# Patient Record
Sex: Male | Born: 1935 | Race: White | Hispanic: No | Smoking: Never smoker
Health system: Southern US, Community
[De-identification: ages and names within clinical notes are randomized; demographics above are authoritative.]

## PROBLEM LIST (undated history)

## (undated) DIAGNOSIS — G629 Polyneuropathy, unspecified: Secondary | ICD-10-CM

## (undated) DIAGNOSIS — N189 Chronic kidney disease, unspecified: Secondary | ICD-10-CM

## (undated) DIAGNOSIS — R600 Localized edema: Secondary | ICD-10-CM

## (undated) DIAGNOSIS — I10 Essential (primary) hypertension: Secondary | ICD-10-CM

## (undated) DIAGNOSIS — E782 Mixed hyperlipidemia: Secondary | ICD-10-CM

## (undated) DIAGNOSIS — R7303 Prediabetes: Secondary | ICD-10-CM

## (undated) DIAGNOSIS — Z8739 Personal history of other diseases of the musculoskeletal system and connective tissue: Secondary | ICD-10-CM

## (undated) DIAGNOSIS — K5909 Other constipation: Secondary | ICD-10-CM

## (undated) DIAGNOSIS — M1990 Unspecified osteoarthritis, unspecified site: Secondary | ICD-10-CM

## (undated) DIAGNOSIS — N3281 Overactive bladder: Secondary | ICD-10-CM

## (undated) DIAGNOSIS — E669 Obesity, unspecified: Secondary | ICD-10-CM

## (undated) HISTORY — DX: Polyneuropathy, unspecified: G62.9

## (undated) HISTORY — DX: Prediabetes: R73.03

## (undated) HISTORY — DX: Obesity, unspecified: E66.9

## (undated) HISTORY — DX: Essential (primary) hypertension: I10

## (undated) HISTORY — DX: Mixed hyperlipidemia: E78.2

## (undated) HISTORY — DX: Other constipation: K59.09

## (undated) HISTORY — PX: STOMACH SURGERY: SHX791

## (undated) HISTORY — DX: Overactive bladder: N32.81

## (undated) HISTORY — PX: HERNIA REPAIR: SHX51

## (undated) HISTORY — DX: Personal history of other diseases of the musculoskeletal system and connective tissue: Z87.39

## (undated) HISTORY — PX: TONSILLECTOMY: SUR1361

## (undated) HISTORY — DX: Chronic kidney disease, unspecified: N18.9

## (undated) HISTORY — DX: Localized edema: R60.0

## (undated) HISTORY — DX: Unspecified osteoarthritis, unspecified site: M19.90

## (undated) HISTORY — PX: GREEN LIGHT LASER TURP (TRANSURETHRAL RESECTION OF PROSTATE: SHX6260

---

## 2003-12-04 ENCOUNTER — Ambulatory Visit: Payer: Self-pay | Admitting: Internal Medicine

## 2003-12-12 ENCOUNTER — Inpatient Hospital Stay: Payer: Self-pay | Admitting: Internal Medicine

## 2003-12-12 ENCOUNTER — Other Ambulatory Visit: Payer: Self-pay

## 2004-03-05 ENCOUNTER — Ambulatory Visit: Payer: Self-pay | Admitting: Gastroenterology

## 2004-06-14 ENCOUNTER — Inpatient Hospital Stay: Payer: Self-pay | Admitting: Specialist

## 2004-06-14 ENCOUNTER — Other Ambulatory Visit: Payer: Self-pay

## 2004-06-16 ENCOUNTER — Inpatient Hospital Stay: Payer: Self-pay | Admitting: Internal Medicine

## 2004-07-15 ENCOUNTER — Ambulatory Visit: Payer: Self-pay | Admitting: Internal Medicine

## 2009-06-28 ENCOUNTER — Ambulatory Visit: Payer: Self-pay | Admitting: Gastroenterology

## 2013-02-10 DIAGNOSIS — R059 Cough, unspecified: Secondary | ICD-10-CM | POA: Diagnosis not present

## 2013-02-10 DIAGNOSIS — R05 Cough: Secondary | ICD-10-CM | POA: Diagnosis not present

## 2013-04-08 DIAGNOSIS — E119 Type 2 diabetes mellitus without complications: Secondary | ICD-10-CM | POA: Diagnosis not present

## 2013-04-08 DIAGNOSIS — E785 Hyperlipidemia, unspecified: Secondary | ICD-10-CM | POA: Diagnosis not present

## 2013-04-08 DIAGNOSIS — Z79899 Other long term (current) drug therapy: Secondary | ICD-10-CM | POA: Diagnosis not present

## 2013-04-15 DIAGNOSIS — I10 Essential (primary) hypertension: Secondary | ICD-10-CM | POA: Diagnosis not present

## 2013-04-15 DIAGNOSIS — E785 Hyperlipidemia, unspecified: Secondary | ICD-10-CM | POA: Diagnosis not present

## 2013-04-15 DIAGNOSIS — E119 Type 2 diabetes mellitus without complications: Secondary | ICD-10-CM | POA: Diagnosis not present

## 2013-04-15 DIAGNOSIS — N189 Chronic kidney disease, unspecified: Secondary | ICD-10-CM | POA: Diagnosis not present

## 2013-06-27 DIAGNOSIS — D631 Anemia in chronic kidney disease: Secondary | ICD-10-CM | POA: Diagnosis not present

## 2013-06-27 DIAGNOSIS — E119 Type 2 diabetes mellitus without complications: Secondary | ICD-10-CM | POA: Diagnosis not present

## 2013-06-27 DIAGNOSIS — N183 Chronic kidney disease, stage 3 unspecified: Secondary | ICD-10-CM | POA: Diagnosis not present

## 2013-06-27 DIAGNOSIS — I10 Essential (primary) hypertension: Secondary | ICD-10-CM | POA: Diagnosis not present

## 2013-06-27 DIAGNOSIS — N2581 Secondary hyperparathyroidism of renal origin: Secondary | ICD-10-CM | POA: Diagnosis not present

## 2013-06-28 DIAGNOSIS — M7989 Other specified soft tissue disorders: Secondary | ICD-10-CM | POA: Diagnosis not present

## 2013-06-28 DIAGNOSIS — M79609 Pain in unspecified limb: Secondary | ICD-10-CM | POA: Diagnosis not present

## 2013-06-28 DIAGNOSIS — I8310 Varicose veins of unspecified lower extremity with inflammation: Secondary | ICD-10-CM | POA: Diagnosis not present

## 2013-07-06 DIAGNOSIS — I890 Lymphedema, not elsewhere classified: Secondary | ICD-10-CM | POA: Diagnosis not present

## 2013-07-13 DIAGNOSIS — M7989 Other specified soft tissue disorders: Secondary | ICD-10-CM | POA: Diagnosis not present

## 2013-07-20 DIAGNOSIS — I890 Lymphedema, not elsewhere classified: Secondary | ICD-10-CM | POA: Diagnosis not present

## 2013-07-27 DIAGNOSIS — M7989 Other specified soft tissue disorders: Secondary | ICD-10-CM | POA: Diagnosis not present

## 2013-07-27 DIAGNOSIS — M79609 Pain in unspecified limb: Secondary | ICD-10-CM | POA: Diagnosis not present

## 2013-07-27 DIAGNOSIS — I8310 Varicose veins of unspecified lower extremity with inflammation: Secondary | ICD-10-CM | POA: Diagnosis not present

## 2013-07-27 DIAGNOSIS — I10 Essential (primary) hypertension: Secondary | ICD-10-CM | POA: Diagnosis not present

## 2013-08-03 DIAGNOSIS — I890 Lymphedema, not elsewhere classified: Secondary | ICD-10-CM | POA: Diagnosis not present

## 2013-08-10 DIAGNOSIS — E119 Type 2 diabetes mellitus without complications: Secondary | ICD-10-CM | POA: Diagnosis not present

## 2013-08-10 DIAGNOSIS — I890 Lymphedema, not elsewhere classified: Secondary | ICD-10-CM | POA: Diagnosis not present

## 2013-08-10 DIAGNOSIS — Z79899 Other long term (current) drug therapy: Secondary | ICD-10-CM | POA: Diagnosis not present

## 2013-08-17 DIAGNOSIS — I890 Lymphedema, not elsewhere classified: Secondary | ICD-10-CM | POA: Diagnosis not present

## 2013-08-17 DIAGNOSIS — E785 Hyperlipidemia, unspecified: Secondary | ICD-10-CM | POA: Diagnosis not present

## 2013-08-17 DIAGNOSIS — N183 Chronic kidney disease, stage 3 unspecified: Secondary | ICD-10-CM | POA: Diagnosis not present

## 2013-08-17 DIAGNOSIS — I10 Essential (primary) hypertension: Secondary | ICD-10-CM | POA: Diagnosis not present

## 2013-08-17 DIAGNOSIS — R7309 Other abnormal glucose: Secondary | ICD-10-CM | POA: Diagnosis not present

## 2013-08-24 DIAGNOSIS — I8310 Varicose veins of unspecified lower extremity with inflammation: Secondary | ICD-10-CM | POA: Diagnosis not present

## 2013-08-24 DIAGNOSIS — I890 Lymphedema, not elsewhere classified: Secondary | ICD-10-CM | POA: Diagnosis not present

## 2013-08-24 DIAGNOSIS — I10 Essential (primary) hypertension: Secondary | ICD-10-CM | POA: Diagnosis not present

## 2013-08-24 DIAGNOSIS — M7989 Other specified soft tissue disorders: Secondary | ICD-10-CM | POA: Diagnosis not present

## 2013-09-19 DIAGNOSIS — N183 Chronic kidney disease, stage 3 unspecified: Secondary | ICD-10-CM | POA: Diagnosis not present

## 2013-09-19 DIAGNOSIS — R609 Edema, unspecified: Secondary | ICD-10-CM | POA: Diagnosis not present

## 2013-09-19 DIAGNOSIS — I10 Essential (primary) hypertension: Secondary | ICD-10-CM | POA: Diagnosis not present

## 2013-09-19 DIAGNOSIS — N039 Chronic nephritic syndrome with unspecified morphologic changes: Secondary | ICD-10-CM | POA: Diagnosis not present

## 2013-09-19 DIAGNOSIS — N2581 Secondary hyperparathyroidism of renal origin: Secondary | ICD-10-CM | POA: Diagnosis not present

## 2013-09-19 DIAGNOSIS — D631 Anemia in chronic kidney disease: Secondary | ICD-10-CM | POA: Diagnosis not present

## 2013-11-08 DIAGNOSIS — Z23 Encounter for immunization: Secondary | ICD-10-CM | POA: Diagnosis not present

## 2013-12-13 DIAGNOSIS — N183 Chronic kidney disease, stage 3 (moderate): Secondary | ICD-10-CM | POA: Diagnosis not present

## 2013-12-13 DIAGNOSIS — M7989 Other specified soft tissue disorders: Secondary | ICD-10-CM | POA: Diagnosis not present

## 2013-12-14 DIAGNOSIS — E785 Hyperlipidemia, unspecified: Secondary | ICD-10-CM | POA: Diagnosis not present

## 2013-12-14 DIAGNOSIS — I10 Essential (primary) hypertension: Secondary | ICD-10-CM | POA: Diagnosis not present

## 2013-12-14 DIAGNOSIS — R7309 Other abnormal glucose: Secondary | ICD-10-CM | POA: Diagnosis not present

## 2013-12-14 DIAGNOSIS — N183 Chronic kidney disease, stage 3 (moderate): Secondary | ICD-10-CM | POA: Diagnosis not present

## 2013-12-19 DIAGNOSIS — E782 Mixed hyperlipidemia: Secondary | ICD-10-CM | POA: Diagnosis not present

## 2013-12-19 DIAGNOSIS — R7309 Other abnormal glucose: Secondary | ICD-10-CM | POA: Diagnosis not present

## 2013-12-19 DIAGNOSIS — I10 Essential (primary) hypertension: Secondary | ICD-10-CM | POA: Diagnosis not present

## 2013-12-19 DIAGNOSIS — N183 Chronic kidney disease, stage 3 (moderate): Secondary | ICD-10-CM | POA: Diagnosis not present

## 2014-01-23 DIAGNOSIS — E1122 Type 2 diabetes mellitus with diabetic chronic kidney disease: Secondary | ICD-10-CM | POA: Diagnosis not present

## 2014-01-23 DIAGNOSIS — I129 Hypertensive chronic kidney disease with stage 1 through stage 4 chronic kidney disease, or unspecified chronic kidney disease: Secondary | ICD-10-CM | POA: Diagnosis not present

## 2014-01-23 DIAGNOSIS — N183 Chronic kidney disease, stage 3 (moderate): Secondary | ICD-10-CM | POA: Diagnosis not present

## 2014-01-23 DIAGNOSIS — R600 Localized edema: Secondary | ICD-10-CM | POA: Diagnosis not present

## 2014-01-23 DIAGNOSIS — R809 Proteinuria, unspecified: Secondary | ICD-10-CM | POA: Diagnosis not present

## 2014-04-26 DIAGNOSIS — Z8739 Personal history of other diseases of the musculoskeletal system and connective tissue: Secondary | ICD-10-CM | POA: Insufficient documentation

## 2014-04-26 HISTORY — DX: Personal history of other diseases of the musculoskeletal system and connective tissue: Z87.39

## 2014-06-20 DIAGNOSIS — M7989 Other specified soft tissue disorders: Secondary | ICD-10-CM | POA: Diagnosis not present

## 2014-06-20 DIAGNOSIS — I10 Essential (primary) hypertension: Secondary | ICD-10-CM | POA: Diagnosis not present

## 2014-07-24 DIAGNOSIS — E119 Type 2 diabetes mellitus without complications: Secondary | ICD-10-CM | POA: Diagnosis not present

## 2014-07-24 DIAGNOSIS — I10 Essential (primary) hypertension: Secondary | ICD-10-CM | POA: Diagnosis not present

## 2014-07-24 DIAGNOSIS — N2581 Secondary hyperparathyroidism of renal origin: Secondary | ICD-10-CM | POA: Diagnosis not present

## 2014-07-24 DIAGNOSIS — N183 Chronic kidney disease, stage 3 (moderate): Secondary | ICD-10-CM | POA: Diagnosis not present

## 2015-01-17 ENCOUNTER — Encounter: Payer: Self-pay | Admitting: Family Medicine

## 2015-02-07 ENCOUNTER — Ambulatory Visit (INDEPENDENT_AMBULATORY_CARE_PROVIDER_SITE_OTHER): Payer: Medicare Other | Admitting: Urology

## 2015-02-07 ENCOUNTER — Encounter: Payer: Self-pay | Admitting: Urology

## 2015-02-07 VITALS — BP 131/70 | HR 73 | Ht 68.0 in | Wt 225.9 lb

## 2015-02-07 DIAGNOSIS — I10 Essential (primary) hypertension: Secondary | ICD-10-CM | POA: Insufficient documentation

## 2015-02-07 DIAGNOSIS — R350 Frequency of micturition: Secondary | ICD-10-CM

## 2015-02-07 DIAGNOSIS — R32 Unspecified urinary incontinence: Secondary | ICD-10-CM

## 2015-02-07 DIAGNOSIS — N189 Chronic kidney disease, unspecified: Secondary | ICD-10-CM

## 2015-02-07 DIAGNOSIS — E785 Hyperlipidemia, unspecified: Secondary | ICD-10-CM | POA: Insufficient documentation

## 2015-02-07 DIAGNOSIS — M1990 Unspecified osteoarthritis, unspecified site: Secondary | ICD-10-CM

## 2015-02-07 DIAGNOSIS — R7303 Prediabetes: Secondary | ICD-10-CM

## 2015-02-07 DIAGNOSIS — N400 Enlarged prostate without lower urinary tract symptoms: Secondary | ICD-10-CM

## 2015-02-07 DIAGNOSIS — E669 Obesity, unspecified: Secondary | ICD-10-CM | POA: Insufficient documentation

## 2015-02-07 DIAGNOSIS — K5909 Other constipation: Secondary | ICD-10-CM

## 2015-02-07 DIAGNOSIS — E782 Mixed hyperlipidemia: Secondary | ICD-10-CM

## 2015-02-07 DIAGNOSIS — R600 Localized edema: Secondary | ICD-10-CM

## 2015-02-07 DIAGNOSIS — N3281 Overactive bladder: Secondary | ICD-10-CM

## 2015-02-07 DIAGNOSIS — G629 Polyneuropathy, unspecified: Secondary | ICD-10-CM

## 2015-02-07 HISTORY — DX: Essential (primary) hypertension: I10

## 2015-02-07 HISTORY — DX: Mixed hyperlipidemia: E78.2

## 2015-02-07 HISTORY — DX: Overactive bladder: N32.81

## 2015-02-07 HISTORY — DX: Polyneuropathy, unspecified: G62.9

## 2015-02-07 HISTORY — DX: Obesity, unspecified: E66.9

## 2015-02-07 HISTORY — DX: Localized edema: R60.0

## 2015-02-07 HISTORY — DX: Other constipation: K59.09

## 2015-02-07 HISTORY — DX: Unspecified osteoarthritis, unspecified site: M19.90

## 2015-02-07 HISTORY — DX: Prediabetes: R73.03

## 2015-02-07 HISTORY — DX: Chronic kidney disease, unspecified: N18.9

## 2015-02-07 LAB — URINALYSIS, COMPLETE
BILIRUBIN UA: NEGATIVE
Glucose, UA: NEGATIVE
Ketones, UA: NEGATIVE
Nitrite, UA: POSITIVE — AB
PH UA: 5 (ref 5.0–7.5)
PROTEIN UA: NEGATIVE
RBC UA: NEGATIVE
Specific Gravity, UA: 1.015 (ref 1.005–1.030)
Urobilinogen, Ur: 0.2 mg/dL (ref 0.2–1.0)

## 2015-02-07 LAB — BLADDER SCAN AMB NON-IMAGING

## 2015-02-07 LAB — MICROSCOPIC EXAMINATION

## 2015-02-07 NOTE — Progress Notes (Signed)
02/07/2015 3:04 PM   Kyle Spence 1932/06/23 [id number]  Referring provider: No referring provider defined for this encounter.  Chief Complaint  Patient presents with  . Urinary Frequency    New Patient  . Urinary Incontinence    HPI: The patient is an 82 year old gentleman with history of a greenlight TURP for BPH who presents with urinary frequency.  The patient is a very poor historian so was very difficult to obtain a history from him. His biggest complaint though is continuous urinary incontinence.  He states this continuous and not just when he has an urge. He has to wear depends diaper. He says the continuous incontinence is what affects his quality of life. The incontinence would stop, he says that his quality of life would much improved. He does also note urgency and weak stream. It is very difficult to obtain further information from the patient as he is unable to provide. His main no concern is a continuous leakage from his urethra requiring wear diapers.   PVR: 113 IPSS: 13/6  PMH: Past Medical History  Diagnosis Date  . Essential (primary) hypertension 02/07/2015  . Chronic constipation 02/07/2015  . Borderline diabetes mellitus 02/07/2015  . Peripheral nerve disease (HCC) 02/07/2015  . Arthritis, degenerative 02/07/2015  . Chronic renal failure 02/07/2015  . 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 Adiposity 02/07/2015  . Combined fat and carbohydrate induced hyperlipemia 02/07/2015  . Edema leg 02/07/2015    Overview:  chronic   . H/O: gout 04/26/2014    Surgical History: Past Surgical History  Procedure Laterality Date  . Green light laser turp (transurethral resection of prostate    . Stomach surgery      child  . Hernia repair    . Tonsillectomy      Home Medications:    Medication List       This list is accurate as of: 02/07/15  3:04 PM.  Always use your most recent med list.               allopurinol 100 MG tablet  Commonly known as:   ZYLOPRIM     aspirin EC 81 MG tablet  Take by mouth.     atorvastatin 40 MG tablet  Commonly known as:  LIPITOR     [facility] DRY EYE RELIEF 0.2-0.2-1 % Soln  Generic drug:  Glycerin-Hypromellose-PEG 400  Apply to eye.     docusate sodium 50 MG capsule  Commonly known as:  COLACE  Take by mouth.     enalapril 20 MG tablet  Commonly known as:  VASOTEC     glucosamine-chondroitin 500-400 MG tablet  Take 1 tablet by mouth 3 (three) times daily.     MULTI-VITAMINS Tabs  Take by mouth.     PROSTATE 2.4 Caps  Take by mouth.     verapamil 240 MG 24 hr capsule  Commonly known as:  VERELAN PM        Allergies:  Allergies  Allergen Reactions  . Penicillins Swelling    Family History: Family History  Problem Relation Age of Onset  . Prostate cancer Neg Hx   . Kidney cancer Neg Hx   . Bladder Cancer Neg Hx     Social History:  reports that he has never smoked. He does not have any smokeless tobacco history on file. He reports that he does not drink alcohol or use illicit drugs.  ROS: UROLOGY Frequent Urination?: Yes Hard to postpone urination?: Yes Burning/pain with urination?: No  Get up at night to urinate?: Yes Leakage of urine?: Yes Urine stream starts and stops?: No Trouble starting stream?: No Do you have to strain to urinate?: No Blood in urine?: No Urinary tract infection?: No Sexually transmitted disease?: No Injury to kidneys or bladder?: No Painful intercourse?: No Weak stream?: No Erection problems?: Yes Penile pain?: No  Gastrointestinal Nausea?: No Vomiting?: No Indigestion/heartburn?: No Diarrhea?: No Constipation?: No  Constitutional Fever: No Night sweats?: No Weight loss?: No Fatigue?: No  Skin Skin rash/lesions?: No Itching?: No  Eyes Blurred vision?: No Double vision?: No  Ears/Nose/Throat Sore throat?: No Sinus problems?: No  Hematologic/Lymphatic Swollen glands?: No Easy bruising?: No  Cardiovascular Leg swelling?:  No Chest pain?: No  Respiratory Cough?: No Shortness of breath?: No  Endocrine Excessive thirst?: No  Musculoskeletal Back pain?: No Joint pain?: No  Neurological Headaches?: No Dizziness?: No  Psychologic Depression?: No Anxiety?: No  Physical Exam: BP 131/70 mmHg  Pulse 73  Ht 5\' 8"  (1.727 m)  Wt 225 lb 14.4 oz (102.468 kg)  BMI 34.36 kg/m2  Constitutional:  Alert and oriented, No acute distress. HEENT: [place] AT, moist mucus membranes.  Trachea midline, no masses. Cardiovascular: No clubbing, cyanosis, or edema. Respiratory: Normal respiratory effort, no increased work of breathing. GI: Abdomen is soft, nontender, nondistended, no abdominal masses GU: No CVA tenderness. Normal phallus. Testicles descended equally bilaterally. DRE: Smooth, 1+. Skin: No rashes, bruises or suspicious lesions. Lymph: No cervical or inguinal adenopathy. Neurologic: Grossly intact, no focal deficits, moving all 4 extremities. Psychiatric: Normal mood and affect.  Laboratory Data: No results found for: WBC, HGB, HCT, MCV, PLT  No results found for: CREATININE  No results found for: PSA  No results found for: TESTOSTERONE  No results found for: HGBA1C  Urinalysis No results found for: COLORURINE, APPEARANCEUR, LABSPEC, PHURINE, GLUCOSEU, HGBUR, BILIRUBINUR, KETONESUR, PROTEINUR, UROBILINOGEN, NITRITE, LEUKOCYTESUR   Assessment & Plan:    Due to the patient's difficulty in providing a thorough history as well as his continuous urinary incontinence which is uncommon in men who have not had a prostatectomy, I would like to further evaluate his urinary tract with office cystoscopy. The patient is able to empty his bladder sufficiently with an appropriate PVR. Since his continuous leakage is the biggest that issues effecting his quality of life, he may benefit from the use of a Cunningham clamp so he will not have to wear depends. However I am concerned that he may not have the dexterity to  be able to use it. We'll further address this after performing cystoscopy.  1. Continuous urinary incontinence -Cystoscopy -Possible Cunningham clamp in the future if the patient is able to show that he has a dexterity use it  2. BPH  As above    No Follow-up on file.  Hildred Laser, MD  [facility] [address] [place], [place] [address] [phone]

## 2015-02-23 ENCOUNTER — Ambulatory Visit: Payer: Medicare Other | Admitting: Urology

## 2015-02-23 DIAGNOSIS — R350 Frequency of micturition: Secondary | ICD-10-CM

## 2015-02-23 DIAGNOSIS — R32 Unspecified urinary incontinence: Secondary | ICD-10-CM

## 2015-02-23 LAB — MICROSCOPIC EXAMINATION
Epithelial Cells (non renal): NONE SEEN /hpf (ref 0–10)
RBC, UA: NONE SEEN /hpf (ref 0–?)
WBC, UA: >30 /hpf — ABNORMAL HIGH (ref 0–?)

## 2015-02-23 LAB — URINALYSIS, COMPLETE
BILIRUBIN UA: NEGATIVE
Glucose, UA: NEGATIVE
Ketones, UA: NEGATIVE
Nitrite, UA: POSITIVE — AB
PH UA: 5 (ref 5.0–7.5)
PROTEIN UA: NEGATIVE
Specific Gravity, UA: 1.01 (ref 1.005–1.030)
Urobilinogen, Ur: 0.2 mg/dL (ref 0.2–1.0)

## 2015-02-25 LAB — CULTURE, URINE COMPREHENSIVE

## 2015-02-26 ENCOUNTER — Telehealth: Payer: Self-pay | Admitting: Urology

## 2015-02-26 NOTE — Telephone Encounter (Signed)
Pt called stating that he was seen last week by Dr. Sherryl Barters and had an infection and could not do the cystoscopy.  He thought an antibiotic was supposed to be called in and patient would like that taken care of.  Please call Rx into his pharmacy and call the patient to let him know when this has been done.

## 2015-02-27 ENCOUNTER — Telehealth: Payer: Self-pay

## 2015-02-27 DIAGNOSIS — N390 Urinary tract infection, site not specified: Secondary | ICD-10-CM

## 2015-02-27 MED ORDER — SULFAMETHOXAZOLE-TRIMETHOPRIM 800-160 MG PO TABS: 1.0000 | ORAL_TABLET | Freq: Two times a day (BID) | ORAL | Status: DC

## 2015-02-27 NOTE — Telephone Encounter (Signed)
Pt called inquiring about abx from office visit. Per Carollee Herter bactrim was called into pharmacy.  Pt has been made aware.

## 2015-03-02 NOTE — Telephone Encounter (Signed)
See previous note

## 2015-03-09 ENCOUNTER — Ambulatory Visit (INDEPENDENT_AMBULATORY_CARE_PROVIDER_SITE_OTHER): Payer: Medicare Other | Admitting: Urology

## 2015-03-09 ENCOUNTER — Encounter: Payer: Self-pay | Admitting: Urology

## 2015-03-09 VITALS — BP 149/82 | HR 70 | Ht 68.0 in | Wt 225.5 lb

## 2015-03-09 DIAGNOSIS — N3020 Other chronic cystitis without hematuria: Secondary | ICD-10-CM

## 2015-03-09 DIAGNOSIS — N39498 Other specified urinary incontinence: Secondary | ICD-10-CM | POA: Diagnosis not present

## 2015-03-09 DIAGNOSIS — N400 Enlarged prostate without lower urinary tract symptoms: Secondary | ICD-10-CM | POA: Diagnosis not present

## 2015-03-09 LAB — URINALYSIS, COMPLETE
BILIRUBIN UA: NEGATIVE
Glucose, UA: NEGATIVE
KETONES UA: NEGATIVE
Leukocytes, UA: NEGATIVE
Nitrite, UA: NEGATIVE
PH UA: 5 (ref 5.0–7.5)
PROTEIN UA: NEGATIVE
RBC UA: NEGATIVE
SPEC GRAV UA: 1.015 (ref 1.005–1.030)
UUROB: 0.2 mg/dL (ref 0.2–1.0)

## 2015-03-09 LAB — MICROSCOPIC EXAMINATION
Bacteria, UA: NONE SEEN
RBC, UA: NONE SEEN /hpf (ref 0–?)
RENAL EPITHEL UA: NONE SEEN /HPF

## 2015-03-09 MED ORDER — LIDOCAINE HCL 2 % EX GEL
1.0000 "application " | Freq: Once | CUTANEOUS | Status: AC
  Administered 2015-03-09: 1 via URETHRAL

## 2015-03-09 MED ORDER — CIPROFLOXACIN HCL 500 MG PO TABS
500.0000 mg | ORAL_TABLET | Freq: Once | ORAL | Status: AC
  Administered 2015-03-09: 500 mg via ORAL

## 2015-03-09 NOTE — Progress Notes (Signed)
03/09/2015 2:09 PM   Kyle Spence 07-22-1932 [id number]  Referring provider: Marisue Ivan, MD [phone] [facility] [address] [facility] [place], [place] [address]  Chief Complaint  Patient presents with  . Cysto    HPI: DR  B: The patient is an 82 year old gentleman with history of a greenlight TURP for BPH who presents with urinary frequency. The patient is a very poor historian so was very difficult to obtain a history from him. His biggest complaint though is continuous urinary incontinence. He states this continuous and not just when he has an urge. He has to wear depends diaper. He says the continuous incontinence is what affects his quality of life. The incontinence would stop, he says that his quality of life would much improved. He does also note urgency and weak stream. It is very difficult to obtain further information from the patient as he is unable to provide. His main no concern is a continuous leakage from his urethra requiring wear diapers.  The patient did have a positive urinary tract infection.  On history today the patient has urge incontinence. He has enuresis. I think he soaks at least one heavy depends a day. He is not a great historian. He is no stress incontinence. He likely had a thermotherapy procedure by Dr. Sheppard Penton approximate 10 years ago.  Modifying factors: There are no other modifying factors  Associated signs and symptoms: There are no other associated signs and symptoms Aggravating and relieving factors: There are no other aggravating or relieving factors Severity: Moderate Duration: Persistent  Today he underwent cystoscopy. The penile and bulbar urethra were normal. He had bilobar enlargement of prostate. He the 2.5 cm prostatic urethra. He grade 1 and a 4 bladder trabeculation. Urine was a bit cloudy. Blood vessels within the prostatic urethra were a little bit friable. He tolerated the procedure well.  PMH: Past Medical History  Diagnosis  Date  . Essential (primary) hypertension 02/07/2015  . Chronic constipation 02/07/2015  . Borderline diabetes mellitus 02/07/2015  . Peripheral nerve disease (HCC) 02/07/2015  . Arthritis, degenerative 02/07/2015  . Chronic renal failure 02/07/2015  . 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 Adiposity 02/07/2015  . Combined fat and carbohydrate induced hyperlipemia 02/07/2015  . Edema leg 02/07/2015    Overview:  chronic   . H/O: gout 04/26/2014    Surgical History: Past Surgical History  Procedure Laterality Date  . Green light laser turp (transurethral resection of prostate    . Stomach surgery      child  . Hernia repair    . Tonsillectomy      Home Medications:    Medication List       This list is accurate as of: 03/09/15  2:09 PM.  Always use your most recent med list.               allopurinol 100 MG tablet  Commonly known as:  ZYLOPRIM     aspirin EC 81 MG tablet  Take by mouth.     atorvastatin 40 MG tablet  Commonly known as:  LIPITOR     [facility] DRY EYE RELIEF 0.2-0.2-1 % Soln  Generic drug:  Glycerin-Hypromellose-PEG 400  Apply to eye.     docusate sodium 50 MG capsule  Commonly known as:  COLACE  Take by mouth.     enalapril 20 MG tablet  Commonly known as:  VASOTEC     glucosamine-chondroitin 500-400 MG tablet  Take 1 tablet by mouth 3 (three) times  daily.     MULTI-VITAMINS Tabs  Take by mouth.     PROSTATE 2.4 Caps  Take by mouth.     sulfamethoxazole-trimethoprim 800-160 MG tablet  Commonly known as:  BACTRIM DS,SEPTRA DS  Take 1 tablet by mouth 2 (two) times daily.     verapamil 240 MG 24 hr capsule  Commonly known as:  VERELAN PM        Allergies:  Allergies  Allergen Reactions  . Penicillins Swelling    Family History: Family History  Problem Relation Age of Onset  . Prostate cancer Neg Hx   . Kidney cancer Neg Hx   . Bladder Cancer Neg Hx     Social History:  reports that he has never smoked. He does not have any smokeless  tobacco history on file. He reports that he does not drink alcohol or use illicit drugs.  ROS: UROLOGY Frequent Urination?: Yes Hard to postpone urination?: Yes Burning/pain with urination?: No Get up at night to urinate?: No Leakage of urine?: Yes Urine stream starts and stops?: No Trouble starting stream?: No Do you have to strain to urinate?: No Blood in urine?: No Urinary tract infection?: No Sexually transmitted disease?: No Injury to kidneys or bladder?: No Painful intercourse?: No Weak stream?: No Erection problems?: Yes Penile pain?: No  Gastrointestinal Nausea?: No Vomiting?: No Indigestion/heartburn?: No Diarrhea?: No Constipation?: No  Constitutional Fever: No Night sweats?: No Weight loss?: No Fatigue?: No  Skin Skin rash/lesions?: No Itching?: No  Eyes Blurred vision?: No Double vision?: No  Ears/Nose/Throat Sore throat?: No Sinus problems?: No  Hematologic/Lymphatic Swollen glands?: No Easy bruising?: No  Cardiovascular Leg swelling?: No Chest pain?: No  Respiratory Cough?: No Shortness of breath?: No  Endocrine Excessive thirst?: No  Musculoskeletal Back pain?: No Joint pain?: No  Neurological Headaches?: No Dizziness?: No  Psychologic Depression?: No Anxiety?: No  Physical Exam:  Laboratory Data:  Urinalysis    Component Value Date/Time   GLUCOSEU Negative 02/23/2015 1413   BILIRUBINUR Negative 02/23/2015 1413   NITRITE Positive* 02/23/2015 1413   LEUKOCYTESUR 2+* 02/23/2015 1413    Pertinent Imaging: None  Assessment & Plan:  By history the patient has urge incontinence and enuresis. He's clinically not infected today. Treating the positive culture did not help the symptoms. The patient had a residual of 113 mL last time.  His flow is reasonable. He leaks while he is sleeping and does not have nighttime frequency associated with it. I want the ultrasound is upper tracts for silent hydronephrosis and also check  another postvoid residual. Depending upon the results I would then offer an alpha-blocker for an overactive bladder medication. He starts to fail medical therapy urodynamics and be ordered.  1. BPH (benign prostatic hyperplasia)  - Urinalysis, Complete - ciprofloxacin (CIPRO) tablet 500 mg; Take 1 tablet (500 mg total) by mouth once. - lidocaine (XYLOCAINE) 2 % jelly 1 application; Place 1 application into the urethra once.  2. Other urinary incontinence  - Urinalysis, Complete - ciprofloxacin (CIPRO) tablet 500 mg; Take 1 tablet (500 mg total) by mouth once. - lidocaine (XYLOCAINE) 2 % jelly 1 application; Place 1 application into the urethra once.   No Follow-up on file.  Martina Sinner, MD  [facility] [address] [place], [place] [address] [phone]

## 2015-03-20 ENCOUNTER — Ambulatory Visit
Admission: RE | Admit: 2015-03-20 | Discharge: 2015-03-20 | Disposition: A | Discharge status: Home / Self Care | Payer: Medicare Other | Source: Ambulatory Visit | Attending: Urology | Admitting: Urology

## 2015-03-20 DIAGNOSIS — N3020 Other chronic cystitis without hematuria: Secondary | ICD-10-CM | POA: Insufficient documentation

## 2015-03-20 DIAGNOSIS — N281 Cyst of kidney, acquired: Secondary | ICD-10-CM | POA: Diagnosis not present

## 2015-03-23 ENCOUNTER — Encounter: Payer: Self-pay | Admitting: Urology

## 2015-03-23 ENCOUNTER — Ambulatory Visit (INDEPENDENT_AMBULATORY_CARE_PROVIDER_SITE_OTHER): Payer: Medicare Other | Admitting: Urology

## 2015-03-23 VITALS — BP 143/68 | HR 78 | Ht 68.0 in | Wt 228.3 lb

## 2015-03-23 DIAGNOSIS — N39498 Other specified urinary incontinence: Secondary | ICD-10-CM

## 2015-03-23 DIAGNOSIS — N400 Enlarged prostate without lower urinary tract symptoms: Secondary | ICD-10-CM

## 2015-03-23 MED ORDER — MIRABEGRON ER 25 MG PO TB24: 25.0000 mg | ORAL_TABLET | Freq: Every day | ORAL | Status: DC

## 2015-03-23 NOTE — Progress Notes (Signed)
03/23/2015 3:24 PM   Kyle Spence [birth year] [id number]  Referring provider: Marisue Ivan, MD [phone] [facility] [address] [facility] [place], [place] [address]  Chief Complaint  Patient presents with  . Results    renal u/s    HPI: The patient is an 82 year old gentleman with history of a greenlight TURP for BPH who presents with urinary frequency. The patient is a very poor historian so was very difficult to obtain a history from him. His biggest complaint though is continuous urinary incontinence. He states this continuous and not just when he has an urge. He has to wear depends diaper. He says the continuous incontinence is what affects his quality of life. The incontinence would stop, he says that his quality of life would much improved. He does also note urgency and weak stream. It is very difficult to obtain further information from the patient as he is unable to provide. His main no concern is a continuous leakage from his urethra requiring wear diapers.  Evauation by Dr. Sherron Monday: The patient did have a positive urinary tract infection.  On history today the patient has urge incontinence. He has enuresis. I think he soaks at least one heavy depends a day. He is not a great historian. He is no stress incontinence. He likely had a thermotherapy procedure by Dr. Sheppard Penton approximate 10 years ago.  Modifying factors: There are no other modifying factors  Associated signs and symptoms: There are no other associated signs and symptoms Aggravating and relieving factors: There are no other aggravating or relieving factors Severity: Moderate Duration: Persistent  Today he underwent cystoscopy. The penile and bulbar urethra were normal. He had bilobar enlargement of prostate. He the 2.5 cm prostatic urethra. He grade 1 and a 4 bladder trabeculation. Urine was a bit cloudy. Blood vessels within the prostatic urethra were a little bit friable. He tolerated the procedure well.    February 2017 Interval History: He returns today to review his ultrasound results and check another post void residual. PVR was 113 at his last visit. The patient reports that his urinary symptoms are unchanged. He completely fills 1 pair of Depends daily. He finds is very bothersome. His renal ultrasound is normal.  PMH: Past Medical History  Diagnosis Date  . Essential (primary) hypertension 02/07/2015  . Chronic constipation 02/07/2015  . Borderline diabetes mellitus 02/07/2015  . Peripheral nerve disease (HCC) 02/07/2015  . Arthritis, degenerative 02/07/2015  . Chronic renal failure 02/07/2015  . 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 Adiposity 02/07/2015  . Combined fat and carbohydrate induced hyperlipemia 02/07/2015  . Edema leg 02/07/2015    Overview:  chronic   . H/O: gout 04/26/2014    Surgical History: Past Surgical History  Procedure Laterality Date  . Green light laser turp (transurethral resection of prostate    . Stomach surgery      child  . Hernia repair    . Tonsillectomy      Home Medications:    Medication List       This list is accurate as of: 03/23/15  3:24 PM.  Always use your most recent med list.               allopurinol 100 MG tablet  Commonly known as:  ZYLOPRIM     aspirin EC 81 MG tablet  Take by mouth.     atorvastatin 40 MG tablet  Commonly known as:  LIPITOR     [facility]S DRY EYE RELIEF 0.2-0.2-1 % Soln  Generic drug:  Glycerin-Hypromellose-PEG 400  Apply to eye.     docusate sodium 50 MG capsule  Commonly known as:  COLACE  Take by mouth.     enalapril 20 MG tablet  Commonly known as:  VASOTEC     glucosamine-chondroitin 500-400 MG tablet  Take 1 tablet by mouth 3 (three) times daily.     mirabegron ER 25 MG Tb24 tablet  Commonly known as:  MYRBETRIQ  Take 1 tablet (25 mg total) by mouth daily.     MULTI-VITAMINS Tabs  Take by mouth.     PROSTATE 2.4 Caps  Take by mouth.     sulfamethoxazole-trimethoprim 800-160 MG  tablet  Commonly known as:  BACTRIM DS,SEPTRA DS  Take 1 tablet by mouth 2 (two) times daily.     verapamil 240 MG 24 hr capsule  Commonly known as:  VERELAN PM        Allergies:  Allergies  Allergen Reactions  . Penicillins Swelling    Family History: Family History  Problem Relation Age of Onset  . Prostate cancer Neg Hx   . Kidney cancer Neg Hx   . Bladder Cancer Neg Hx     Social History:  reports that he has never smoked. He does not have any smokeless tobacco history on file. He reports that he does not drink alcohol or use illicit drugs.  ROS: UROLOGY Frequent Urination?: Yes Hard to postpone urination?: Yes Burning/pain with urination?: No Get up at night to urinate?: Yes Leakage of urine?: Yes Urine stream starts and stops?: No Trouble starting stream?: No Do you have to strain to urinate?: No Blood in urine?: No Urinary tract infection?: No Sexually transmitted disease?: No Injury to kidneys or bladder?: No Painful intercourse?: No Weak stream?: No Erection problems?: Yes Penile pain?: No  Gastrointestinal Nausea?: No Vomiting?: No Indigestion/heartburn?: No Diarrhea?: No Constipation?: No  Constitutional Fever: No Night sweats?: No Weight loss?: No Fatigue?: No  Skin Skin rash/lesions?: No Itching?: No  Eyes Blurred vision?: No Double vision?: No  Ears/Nose/Throat Sore throat?: No Sinus problems?: No  Hematologic/Lymphatic Swollen glands?: No Easy bruising?: Yes  Cardiovascular Leg swelling?: Yes Chest pain?: No  Respiratory Cough?: No Shortness of breath?: No  Endocrine Excessive thirst?: No  Musculoskeletal Back pain?: No Joint pain?: No  Neurological Headaches?: No Dizziness?: No  Psychologic Depression?: No Anxiety?: No  Physical Exam: BP 143/68 mmHg  Pulse 78  Ht  (1.727 m)  Wt 228 lb 4.8 oz (103.556 kg)  BMI 34.72 kg/m2  Constitutional:  Alert and oriented, No acute distress. HEENT: [place] AT,  moist mucus membranes.  Trachea midline, no masses. Cardiovascular: No clubbing, cyanosis, or edema. Respiratory: Normal respiratory effort, no increased work of breathing. GI: Abdomen is soft, nontender, nondistended, no abdominal masses GU: No CVA tenderness.  Skin: No rashes, bruises or suspicious lesions. Lymph: No cervical or inguinal adenopathy. Neurologic: Grossly intact, no focal deficits, moving all 4 extremities. Psychiatric: Normal mood and affect.  Laboratory Data: No results found for: WBC, HGB, HCT, MCV, PLT  No results found for: CREATININE  No results found for: PSA  No results found for: TESTOSTERONE  No results found for: HGBA1C  Urinalysis    Component Value Date/Time   GLUCOSEU Negative 03/09/2015 1333   BILIRUBINUR Negative 03/09/2015 1333   NITRITE Negative 03/09/2015 1333   LEUKOCYTESUR Negative 03/09/2015 1333   EXAM: RENAL / URINARY TRACT ULTRASOUND COMPLETE  COMPARISON: CT of abdomen pelvis Jun 16, 2004  FINDINGS: Right  Kidney:  Length: 10.5 cm. Echogenicity within normal limits. No mass or hydronephrosis visualized.  Left Kidney:  Length: 10.2 cm. Echogenicity within normal limits. No hydronephrosis visualized. There is a 3.5 x 3.1 x 2.8 cm cyst in the upper pole left kidney.  Bladder:  Appears normal for degree of bladder distention. Bilateral ureteral jets are noted.  IMPRESSION: No acute abnormality. Left kidney cyst.  Assessment & Plan:   The patient is a very poor historian, appears that his main problem is urinary incontinence is not related to stress. He is safely emptying his bladder this time. We will give him a trial of Myrbetriq at this time for his incontinence. He was given 4 weeks of samples. He was instructed to contact the office medications to expensive. He is a very poor candidate for anticholinergic therapy due to his baseline mental status. If he fails this medication, we will consider urodynamic  studies.  1. Urinary incontinence -Myrbetriq 25 daily -follow up in 3 months  2. BPH His main issue at this time seems to be urinary incontinence and not BPH. No treatment at this time.   Return in about 3 months (around 06/20/2015).  Hildred Laser, MD  [facility] [address], [address] [place], [place] [address] [phone]

## 2015-03-26 ENCOUNTER — Other Ambulatory Visit: Payer: Self-pay

## 2015-03-26 DIAGNOSIS — N3281 Overactive bladder: Secondary | ICD-10-CM

## 2015-03-26 MED ORDER — MIRABEGRON ER 25 MG PO TB24: 25.0000 mg | ORAL_TABLET | Freq: Every day | ORAL | Status: DC

## 2015-05-24 ENCOUNTER — Telehealth: Payer: Self-pay | Admitting: Radiology

## 2015-05-24 NOTE — Telephone Encounter (Signed)
He can increase the dose to 50 mg from 25 mg.

## 2015-05-24 NOTE — Telephone Encounter (Signed)
Pt c/o frequency & states Myrbetriq 25 mg isn't working. Please advise.

## 2015-05-24 NOTE — Telephone Encounter (Signed)
LMOM

## 2015-05-28 NOTE — Telephone Encounter (Signed)
Advised pt to increase Myrbetriq from 25mg  to 50mg  per Dr Sherryl BartersBudzyn. Pt voices understanding & states he will f/u with Dr Sherryl BartersBudzyn as scheduled on 06/21/15 @ 3:15.

## 2015-06-21 ENCOUNTER — Ambulatory Visit: Payer: Medicare Other

## 2015-06-21 ENCOUNTER — Ambulatory Visit (INDEPENDENT_AMBULATORY_CARE_PROVIDER_SITE_OTHER): Payer: Medicare Other | Admitting: Urology

## 2015-06-21 VITALS — BP 147/73 | HR 69 | Ht 69.0 in | Wt 229.0 lb

## 2015-06-21 DIAGNOSIS — R32 Unspecified urinary incontinence: Secondary | ICD-10-CM | POA: Diagnosis not present

## 2015-06-21 LAB — BLADDER SCAN AMB NON-IMAGING

## 2015-06-21 NOTE — Progress Notes (Signed)
06/21/2015 3:25 PM   Kyle Spence 05/13/1932 [id number]  Referring provider: Marisue Ivan, MD [phone] [facility] [address] [facility] [place], [place] [address]  Chief Complaint  Patient presents with  . Urinary Incontinence    3months    HPI: The patient is an 82 year old gentleman with history of a greenlight TURP for BPH who presents with urinary frequency. The patient is a very poor historian so was very difficult to obtain a history from him. His biggest complaint though is continuous urinary incontinence. He states this continuous and not just when he has an urge. He has to wear depends diaper. He says the continuous incontinence is what affects his quality of life. The incontinence would stop, he says that his quality of life would much improved. He does also note urgency and weak stream. It is very difficult to obtain further information from the patient as he is unable to provide. His main no concern is a continuous leakage from his urethra requiring wear diapers.  Evauation by Dr. Sherron Monday: The patient did have a positive urinary tract infection.  On history today the patient has urge incontinence. He has enuresis. I think he soaks at least one heavy depends a day. He is not a great historian. He is no stress incontinence. He likely had a thermotherapy procedure by Dr. Sheppard Penton approximate 10 years ago.  Modifying factors: There are no other modifying factors  Associated signs and symptoms: There are no other associated signs and symptoms Aggravating and relieving factors: There are no other aggravating or relieving factors Severity: Moderate Duration: Persistent  Today he underwent cystoscopy. The penile and bulbar urethra were normal. He had bilobar enlargement of prostate. He the 2.5 cm prostatic urethra. He grade 1 and a 4 bladder trabeculation. Urine was a bit cloudy. Blood vessels within the prostatic urethra were a little bit friable. He tolerated the  procedure well.   February 2017 Interval History: He returns today to review his ultrasound results and check another post void residual. PVR was 113 at his last visit. The patient reports that his urinary symptoms are unchanged. He completely fills 1 pair of Depends daily. He finds is very bothersome. His renal ultrasound is normal.  May 2017 interval history: The patient was started on Myrbetriq 25 mg at his last appointment. This does not work well for him and approximately 1 month ago this was increased to 50 mg daily. He has had no change in his urinary status with this medication. He continuously urine constantly during the day and night. He does not have urgency. He does not feel himself going. He feels the medications expensive. He is happy wearing depends. He is not interested in trying to medications or further workup this time as he cannot afford it. He is fine stopping Myrbetriq and using depends.  As with previous visits the patient is a very poor historian.   PMH: Past Medical History  Diagnosis Date  . Essential (primary) hypertension 02/07/2015  . Chronic constipation 02/07/2015  . Borderline diabetes mellitus 02/07/2015  . Peripheral nerve disease (HCC) 02/07/2015  . Arthritis, degenerative 02/07/2015  . Chronic renal failure 02/07/2015  . 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 Adiposity 02/07/2015  . Combined fat and carbohydrate induced hyperlipemia 02/07/2015  . Edema leg 02/07/2015    Overview:  chronic   . H/O: gout 04/26/2014    Surgical History: Past Surgical History  Procedure Laterality Date  . Green light laser turp (transurethral resection of prostate    .  Stomach surgery      child  . Hernia repair    . Tonsillectomy      Home Medications:    Medication List       This list is accurate as of: 06/21/15  3:25 PM.  Always use your most recent med list.               allopurinol 100 MG tablet  Commonly known as:  ZYLOPRIM     aspirin EC 81 MG tablet    Take by mouth.     atorvastatin 40 MG tablet  Commonly known as:  LIPITOR     [facility] DRY EYE RELIEF 0.2-0.2-1 % Soln  Generic drug:  Glycerin-Hypromellose-PEG 400  Apply to eye.     docusate sodium 50 MG capsule  Commonly known as:  COLACE  Take by mouth.     enalapril 20 MG tablet  Commonly known as:  VASOTEC     glucosamine-chondroitin 500-400 MG tablet  Take 1 tablet by mouth 3 (three) times daily.     mirabegron ER 25 MG Tb24 tablet  Commonly known as:  MYRBETRIQ  Take 1 tablet (25 mg total) by mouth daily.     MULTI-VITAMINS Tabs  Take by mouth.     PROSTATE 2.4 Caps  Take by mouth.     verapamil 240 MG 24 hr capsule  Commonly known as:  VERELAN PM        Allergies:  Allergies  Allergen Reactions  . Penicillins Swelling    Family History: Family History  Problem Relation Age of Onset  . Prostate cancer Neg Hx   . Kidney cancer Neg Hx   . Bladder Cancer Neg Hx     Social History:  reports that he has never smoked. He does not have any smokeless tobacco history on file. He reports that he does not drink alcohol or use illicit drugs.  ROS: UROLOGY Frequent Urination?: No Hard to postpone urination?: No Burning/pain with urination?: No Get up at night to urinate?: No Leakage of urine?: No Urine stream starts and stops?: No Trouble starting stream?: No Do you have to strain to urinate?: No Blood in urine?: No Urinary tract infection?: No Sexually transmitted disease?: No Injury to kidneys or bladder?: No Painful intercourse?: No Weak stream?: No Erection problems?: No Penile pain?: No  Gastrointestinal Nausea?: No Vomiting?: No Indigestion/heartburn?: No Diarrhea?: No Constipation?: No  Constitutional Fever: No Night sweats?: No Weight loss?: No Fatigue?: No  Skin Skin rash/lesions?: No Itching?: No  Eyes Blurred vision?: No Double vision?: No  Ears/Nose/Throat Sore throat?: No Sinus problems?:  No  Hematologic/Lymphatic Swollen glands?: No Easy bruising?: No  Cardiovascular Leg swelling?: No Chest pain?: No  Respiratory Cough?: No Shortness of breath?: No  Endocrine Excessive thirst?: No  Musculoskeletal Back pain?: No Joint pain?: No  Neurological Headaches?: No Dizziness?: No  Psychologic Depression?: No Anxiety?: No  Physical Exam: BP 147/73 mmHg  Pulse 69  Ht 5\' 9"  (1.753 m)  Wt 229 lb (103.874 kg)  BMI 33.80 kg/m2  Constitutional:  Alert and oriented, No acute distress. HEENT: [place] AT, moist mucus membranes.  Trachea midline, no masses. Cardiovascular: No clubbing, cyanosis, or edema. Respiratory: Normal respiratory effort, no increased work of breathing. GI: Abdomen is soft, nontender, nondistended, no abdominal masses GU: No CVA tenderness.  Skin: No rashes, bruises or suspicious lesions. Lymph: No cervical or inguinal adenopathy. Neurologic: Grossly intact, no focal deficits, moving all 4 extremities. Psychiatric: Normal mood and affect.  Laboratory Data: No results found for: WBC, HGB, HCT, MCV, PLT  No results found for: CREATININE  No results found for: PSA  No results found for: TESTOSTERONE  No results found for: HGBA1C  Urinalysis    Component Value Date/Time   APPEARANCEUR Clear 03/09/2015 1333   GLUCOSEU Negative 03/09/2015 1333   BILIRUBINUR Negative 03/09/2015 1333   PROTEINUR Negative 03/09/2015 1333   NITRITE Negative 03/09/2015 1333   LEUKOCYTESUR Negative 03/09/2015 1333     Assessment & Plan:   1. Urinary incontinence 2. BPH The patient did not get any relief in his incontinence with Mybetriq. He will stop this medication as it is not helping and it is expensive. I offered him further workup with urodynamic studies or Flomax. He is not interested in trying further medications or participating in extensive workups that may not provide further answers. He is safely entering his bladder today with a PVR of 106. As he  is not interested in further workup of his incontinence since this time and will continue her depends, he will follow-up on an as-needed basis or if his symptoms worsen.  Return if symptoms worsen or fail to improve.  Hildred Laser, MD  [facility] [address], [address] [place], [place] [address] [phone]

## 2016-03-11 ENCOUNTER — Encounter (INDEPENDENT_AMBULATORY_CARE_PROVIDER_SITE_OTHER): Payer: Self-pay | Admitting: Vascular Surgery

## 2016-03-11 ENCOUNTER — Ambulatory Visit (INDEPENDENT_AMBULATORY_CARE_PROVIDER_SITE_OTHER): Payer: Medicare Other | Admitting: Vascular Surgery

## 2016-03-11 VITALS — BP 170/75 | HR 67 | Resp 15 | Ht 65.0 in | Wt 222.0 lb

## 2016-03-11 DIAGNOSIS — I10 Essential (primary) hypertension: Secondary | ICD-10-CM | POA: Diagnosis not present

## 2016-03-11 DIAGNOSIS — M79605 Pain in left leg: Secondary | ICD-10-CM | POA: Diagnosis not present

## 2016-03-11 DIAGNOSIS — N183 Chronic kidney disease, stage 3 unspecified: Secondary | ICD-10-CM

## 2016-03-11 DIAGNOSIS — E782 Mixed hyperlipidemia: Secondary | ICD-10-CM

## 2016-03-11 DIAGNOSIS — M7989 Other specified soft tissue disorders: Secondary | ICD-10-CM | POA: Diagnosis not present

## 2016-03-11 DIAGNOSIS — M79609 Pain in unspecified limb: Secondary | ICD-10-CM | POA: Insufficient documentation

## 2016-03-11 DIAGNOSIS — M79604 Pain in right leg: Secondary | ICD-10-CM

## 2016-03-11 NOTE — Assessment & Plan Note (Signed)
lipid control important in reducing the progression of atherosclerotic disease. Continue statin therapy  

## 2016-03-11 NOTE — Progress Notes (Signed)
Patient ID: Kyle Spence, male   DOB: 1932/05/22, 83 y.o.   MRN: [id number]  Chief Complaint  Patient presents with  . Re-evaluation    PVD    HPI Kyle Spence is a 83 y.o. male.  I am asked to see the patient by DR. Kolluru for evaluation of leg swelling and cyanosis.  The patient reports This to have been present and gradually worsening over many years. I previously saw him about 2 years ago. We recommended the daily use of compression stockings, leg elevation, and exercises. His wife admits that he has done none of these. He sits with his feet dependent much of the day. He does not wear his compression stockings. He really does not walk all that regularly. He does not have ulceration or infection. They seem mildly uncomfortable to him, but are not overtly painful. He does not appear to have ischemic rest pain. He has not had noninvasive studies in the last year or 2. Both lower extremities are affected. He has chronic kidney disease and who is his nephrologist to referred him to our office. He has multiple other medical issues as listed below.   Past Medical History:  Diagnosis Date  . Adiposity 02/07/2015  . Arthritis, degenerative 02/07/2015  . Borderline diabetes mellitus 02/07/2015  . Chronic constipation 02/07/2015  . Chronic renal failure 02/07/2015  . Combined fat and carbohydrate induced hyperlipemia 02/07/2015  . 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 Edema leg 02/07/2015   Overview:  chronic   . Essential (primary) hypertension 02/07/2015  . H/O: gout 04/26/2014  . Peripheral nerve disease (HCC) 02/07/2015    Past Surgical History:  Procedure Laterality Date  . GREEN LIGHT LASER TURP (TRANSURETHRAL RESECTION OF PROSTATE    . HERNIA REPAIR    . STOMACH SURGERY     child  . TONSILLECTOMY      Family History  Problem Relation Age of Onset  . Prostate cancer Neg Hx   . Kidney cancer Neg Hx   . Bladder Cancer Neg Hx   NO bleeding disorders, clotting disorders, or  autoimmune diseases  Social History Social History  Substance Use Topics  . Smoking status: Never Smoker  . Smokeless tobacco: Never Used  . Alcohol use No  No IVDU  Allergies  Allergen Reactions  . Penicillins Swelling    Current Outpatient Prescriptions  Medication Sig Dispense Refill  . allopurinol (ZYLOPRIM) 100 MG tablet   3  . aspirin EC 81 MG tablet Take by mouth.    Marland Kitchen atorvastatin (LIPITOR) 40 MG tablet     . docusate sodium (COLACE) 50 MG capsule Take by mouth.    . enalapril (VASOTEC) 20 MG tablet     . glucosamine-chondroitin 500-400 MG tablet Take 1 tablet by mouth 3 (three) times daily.    . Glycerin-Hypromellose-PEG 400 ([facility] DRY EYE RELIEF) 0.2-0.2-1 % SOLN Apply to eye.    . mirabegron ER (MYRBETRIQ) 25 MG TB24 tablet Take 1 tablet (25 mg total) by mouth daily. (Patient taking differently: Take 50 mg by mouth daily. ) 30 tablet 11  . Multiple Vitamin (MULTI-VITAMINS) TABS Take by mouth.    . Nutritional Supplements (PROSTATE 2.4) CAPS Take by mouth.    . verapamil (VERELAN PM) 240 MG 24 hr capsule      No current facility-administered medications for this visit.       REVIEW OF SYSTEMS (Negative unless checked)  Constitutional: [] Weight loss  [] Fever  [] Chills Cardiac: [] Chest pain   []   Chest pressure   [] Palpitations   [] Shortness of breath when laying flat   [] Shortness of breath at rest   [] Shortness of breath with exertion. Vascular:  [x] Pain in legs with walking   [] Pain in legs at rest   [] Pain in legs when laying flat   [] Claudication   [] Pain in feet when walking  [] Pain in feet at rest  [] Pain in feet when laying flat   [] History of DVT   [] Phlebitis   [x] Swelling in legs   [] Varicose veins   [] Non-healing ulcers Pulmonary:   [] Uses home oxygen   [] Productive cough   [] Hemoptysis   [] Wheeze  [] COPD   [] Asthma Neurologic:  [] Dizziness  [] Blackouts   [] Seizures   [] History of stroke   [] History of TIA  [] Aphasia   [] Temporary blindness   [] Dysphagia    [] Weakness or numbness in arms   [] Weakness or numbness in legs Musculoskeletal:  [x] Arthritis   [] Joint swelling   [x] Joint pain   [] Low back pain Hematologic:  [] Easy bruising  [] Easy bleeding   [] Hypercoagulable state   [] Anemic  [] Hepatitis Gastrointestinal:  [] Blood in stool   [] Vomiting blood  [] Gastroesophageal reflux/heartburn   [] Abdominal pain Genitourinary:  [x] Chronic kidney disease   [] Difficult urination  [] Frequent urination  [] Burning with urination   [] Hematuria Skin:  [] Rashes   [] Ulcers   [] Wounds Psychological:  [] History of anxiety   []  History of major depression.    Physical Exam BP (!) 170/75 (BP Location: Left Arm)   Pulse 67   Resp 15   Ht 5\' 5"  (1.651 m)   Wt 222 lb (100.7 kg)   BMI 36.94 kg/m  Gen:  WD/WN, NAD Head: [place]/AT, No temporalis wasting. Prominent temp pulse not noted. Ear/Nose/Throat: Hearing grossly intact, nares w/o erythema or drainage, oropharynx w/o Erythema/Exudate Eyes: Conjunctiva clear, sclera non-icteric  Neck: trachea midline.  No JVD.  Pulmonary:  Good air movement, respirations not labored.  Cardiac: RRR, normal S1, S2 Vascular:  Vessel Right Left  Radial Palpable Palpable  Ulnar Palpable Palpable  Brachial Palpable Palpable  Carotid Palpable, without bruit Palpable, without bruit  Aorta Not palpable N/A  Femoral Palpable Palpable  Popliteal Trace Palpable Not Palpable  PT Not Palpable Trace Palpable  DP 1+ Palpable 1+ Palpable   Gastrointestinal: soft, non-tender/non-distended. No guarding/reflex. No masses, surgical incisions, or scars. Musculoskeletal: M/S 5/5 throughout.  Walks with a cane.  No deformity or atrophy. 1-2+ BLE edema. Stasis changes are present bilaterally.  Neurologic: Sensation grossly intact in extremities.  Symmetrical.  Speech is fluent. Motor exam as listed above. Psychiatric: Judgment fair, poor historian, Mood & affect appropriate for pt's clinical situation. Dermatologic: No rashes or ulcers noted.   No cellulitis or open wounds. Lymph : No Cervical, Axillary, or Inguinal lymphadenopathy.   Radiology No results found.  Labs No results found for this or any previous visit (from the past 2160 hour(s)).  Assessment/Plan:  Essential (primary) hypertension lipid control important in reducing the progression of atherosclerotic disease. Continue statin therapy   Combined fat and carbohydrate induced hyperlipemia lipid control important in reducing the progression of atherosclerotic disease. Continue statin therapy   Chronic renal failure Likely contributing to his lower extremity swelling somewhat. Followed by nephrology.  Swelling of limb Recommended the regular use of compression stockings, leg elevation, and increasing his activity. This has been recommended previously and he has not really followed through with it.  Pain in limb  Recommend:  The patient has atypical pain symptoms for pure  atherosclerotic disease. However, on physical exam there is evidence of mixed venous and arterial disease, given the diminished pulses and the edema associated with venous changes of the legs.  Noninvasive studies including ABI's and venous ultrasound of the legs will be obtained and the patient will follow up with me to review these studies.  The patient should continue walking and begin a more formal exercise program. The patient should continue his antiplatelet therapy and aggressive treatment of the lipid abnormalities.  The patient should begin wearing graduated compression socks 15-20 mmHg strength to control edema.       Festus Barren 03/11/2016, 3:47 PM   This note was created with Dragon medical transcription system.  Any errors from dictation are unintentional.

## 2016-03-11 NOTE — Assessment & Plan Note (Signed)
Recommended the regular use of compression stockings, leg elevation, and increasing his activity. This has been recommended previously and he has not really followed through with it.

## 2016-03-11 NOTE — Assessment & Plan Note (Signed)
Likely contributing to his lower extremity swelling somewhat. Followed by nephrology.

## 2016-03-11 NOTE — Assessment & Plan Note (Signed)

## 2016-03-11 NOTE — Patient Instructions (Signed)
Venous Stasis or Chronic Venous Insufficiency Chronic venous insufficiency, also called venous stasis, is a condition that affects the veins in the legs. The condition prevents blood from being pumped through these veins effectively. Blood may no longer be pumped effectively from the legs back to the heart. This condition can range from mild to severe. With proper treatment, you should be able to continue with an active life. CAUSES  Chronic venous insufficiency occurs when the vein walls become stretched, weakened, or damaged or when valves within the vein are damaged. Some common causes of this include:  High blood pressure inside the veins (venous hypertension).  Increased blood pressure in the leg veins from long periods of sitting or standing.  A blood clot that blocks blood flow in a vein (deep vein thrombosis).  Inflammation of a superficial vein (phlebitis) that causes a blood clot to form. RISK FACTORS Various things can make you more likely to develop chronic venous insufficiency, including:  Family history of this condition.  Obesity.  Pregnancy.  Sedentary lifestyle.  Smoking.  Jobs requiring long periods of standing or sitting in one place.  Being a certain age. Women in their 40s and 50s and men in their 70s are more likely to develop this condition. SIGNS AND SYMPTOMS  Symptoms may include:   Varicose veins.  Skin breakdown or ulcers.  Reddened or discolored skin on the leg.  Brown, smooth, tight, and painful skin just above the ankle, usually on the inside surface (lipodermatosclerosis).  Swelling. DIAGNOSIS  To diagnose this condition, your health care provider will take a medical history and do a physical exam. The following tests may be ordered to confirm the diagnosis:  Duplex ultrasound-A procedure that produces a picture of a blood vessel and nearby organs and also provides information on blood flow through the blood vessel.  Plethysmography-A  procedure that tests blood flow.  A venogram, or venography-A procedure used to look at the veins using X-ray and dye. TREATMENT The goals of treatment are to help you return to an active life and to minimize pain or disability. Treatment will depend on the severity of the condition. Medical procedures may be needed for severe cases. Treatment options may include:   Use of compression stockings. These can help with symptoms and lower the chances of the problem getting worse, but they do not cure the problem.  Sclerotherapy-A procedure involving an injection of a material that "dissolves" the damaged veins. Other veins in the network of blood vessels take over the function of the damaged veins.  Surgery to remove the vein or cut off blood flow through the vein (vein stripping or laser ablation surgery).  Surgery to repair a valve. HOME CARE INSTRUCTIONS   Wear compression stockings as directed by your health care provider.  Only take over-the-counter or prescription medicines for pain, discomfort, or fever as directed by your health care provider.  Follow up with your health care provider as directed. SEEK MEDICAL CARE IF:   You have redness, swelling, or increasing pain in the affected area.  You see a red streak or line that extends up or down from the affected area.  You have a breakdown or loss of skin in the affected area, even if the breakdown is small.  You have an injury to the affected area. SEEK IMMEDIATE MEDICAL CARE IF:   You have an injury and open wound in the affected area.  Your pain is severe and does not improve with medicine.  You have   sudden numbness or weakness in the foot or ankle below the affected area, or you have trouble moving your foot or ankle.  You have a fever or persistent symptoms for more than 2-3 days.  You have a fever and your symptoms suddenly get worse. MAKE SURE YOU:   Understand these instructions.  Will watch your condition.  Will  get help right away if you are not doing well or get worse. This information is not intended to replace advice given to you by your health care provider. Make sure you discuss any questions you have with your health care provider. Document Released: 05/19/2006 Document Revised: 11/03/2012 Document Reviewed: 09/20/2012 [employer] Interactive Patient Education  2017 [employer]  

## 2016-05-13 ENCOUNTER — Ambulatory Visit (INDEPENDENT_AMBULATORY_CARE_PROVIDER_SITE_OTHER): Payer: Medicare Other

## 2016-05-13 ENCOUNTER — Encounter (INDEPENDENT_AMBULATORY_CARE_PROVIDER_SITE_OTHER): Payer: Self-pay | Admitting: Vascular Surgery

## 2016-05-13 ENCOUNTER — Ambulatory Visit (INDEPENDENT_AMBULATORY_CARE_PROVIDER_SITE_OTHER): Payer: Medicare Other | Admitting: Vascular Surgery

## 2016-05-13 VITALS — BP 128/64 | HR 63 | Resp 16 | Wt 227.0 lb

## 2016-05-13 DIAGNOSIS — I10 Essential (primary) hypertension: Secondary | ICD-10-CM

## 2016-05-13 DIAGNOSIS — N183 Chronic kidney disease, stage 3 unspecified: Secondary | ICD-10-CM

## 2016-05-13 DIAGNOSIS — M7989 Other specified soft tissue disorders: Secondary | ICD-10-CM

## 2016-05-13 DIAGNOSIS — M79604 Pain in right leg: Secondary | ICD-10-CM

## 2016-05-13 DIAGNOSIS — I872 Venous insufficiency (chronic) (peripheral): Secondary | ICD-10-CM

## 2016-05-13 DIAGNOSIS — E782 Mixed hyperlipidemia: Secondary | ICD-10-CM | POA: Diagnosis not present

## 2016-05-13 DIAGNOSIS — M79605 Pain in left leg: Principal | ICD-10-CM

## 2016-05-13 DIAGNOSIS — I739 Peripheral vascular disease, unspecified: Secondary | ICD-10-CM | POA: Insufficient documentation

## 2016-05-13 NOTE — Progress Notes (Signed)
MRN : [id number]  Kyle Spence is a 83 y.o. (Feb 18, 1932) male who presents with chief complaint of  Chief Complaint  Patient presents with  . Follow-up  .  History of Present Illness: Patient returns today in follow up of Leg pain and discoloration. He says his legs are normal but better. He is still having some swelling, but this is reasonably stable to improved from his last visit. His noninvasive studies today show completely normal arterial perfusion with normal triphasic waveforms bilaterally and ABIs of 1.3 on the right and 1.29 on the left. His venous reflux study demonstrates continued ablation of both great saphenous veins with reflux in both small saphenous veins. No DVT or superficial thrombophlebitis is identified.  Current Outpatient Prescriptions  Medication Sig Dispense Refill  . allopurinol (ZYLOPRIM) 100 MG tablet   3  . aspirin EC 81 MG tablet Take by mouth.    Marland Kitchen atorvastatin (LIPITOR) 40 MG tablet     . docusate sodium (COLACE) 50 MG capsule Take by mouth.    . enalapril (VASOTEC) 20 MG tablet     . glucosamine-chondroitin 500-400 MG tablet Take 1 tablet by mouth 3 (three) times daily.    . Glycerin-Hypromellose-PEG 400 ([facility] DRY EYE RELIEF) 0.2-0.2-1 % SOLN Apply to eye.    . mirabegron ER (MYRBETRIQ) 25 MG TB24 tablet Take 1 tablet (25 mg total) by mouth daily. (Patient taking differently: Take 50 mg by mouth daily. ) 30 tablet 11  . Multiple Vitamin (MULTI-VITAMINS) TABS Take by mouth.    . Nutritional Supplements (PROSTATE 2.4) CAPS Take by mouth.    . verapamil (VERELAN PM) 240 MG 24 hr capsule      No current facility-administered medications for this visit.     Past Medical History:  Diagnosis Date  . Adiposity 02/07/2015  . Arthritis, degenerative 02/07/2015  . Borderline diabetes mellitus 02/07/2015  . Chronic constipation 02/07/2015  . Chronic renal failure 02/07/2015  . Combined fat and carbohydrate induced hyperlipemia 02/07/2015  . 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 Edema leg 02/07/2015   Overview:  chronic   . Essential (primary) hypertension 02/07/2015  . H/O: gout 04/26/2014  . Peripheral nerve disease 02/07/2015    Past Surgical History:  Procedure Laterality Date  . GREEN LIGHT LASER TURP (TRANSURETHRAL RESECTION OF PROSTATE    . HERNIA REPAIR    . STOMACH SURGERY     child  . TONSILLECTOMY      Social History Social History  Substance Use Topics  . Smoking status: Never Smoker  . Smokeless tobacco: Never Used  . Alcohol use No     Family History Family History  Problem Relation Age of Onset  . Prostate cancer Neg Hx   . Kidney cancer Neg Hx   . Bladder Cancer Neg Hx     Allergies  Allergen Reactions  . Penicillins Swelling     REVIEW OF SYSTEMS (Negative unless checked)  Constitutional: Weight loss  Fever  Chills Cardiac: Chest pain   Chest pressure   Palpitations   Shortness of breath when laying flat   Shortness of breath at rest   Shortness of breath with exertion. Vascular:  Pain in legs with walking   Pain in legs at rest   Pain in legs when laying flat   Claudication   Pain in feet when walking  Pain in feet at rest  Pain in feet when laying flat   History of DVT   Phlebitis   [  x]Swelling in legs   Varicose veins   Non-healing ulcers Pulmonary:   Uses home oxygen   Productive cough   Hemoptysis   Wheeze  COPD   Asthma Neurologic:  Dizziness  Blackouts   Seizures   History of stroke   History of TIA  Aphasia   Temporary blindness   Dysphagia   Weakness or numbness in arms   Weakness or numbness in legs Musculoskeletal:  Arthritis   Joint swelling   Joint pain   Low back pain Hematologic:  Easy bruising  Easy bleeding   Hypercoagulable state   Anemic  Hepatitis Gastrointestinal:  Blood in stool   Vomiting blood  Gastroesophageal reflux/heartburn   Abdominal pain Genitourinary:  Chronic kidney  disease   Difficult urination  Frequent urination  Burning with urination   Hematuria Skin:  Rashes   Ulcers   Wounds Psychological:  History of anxiety    History of major depression.   Physical Examination  BP 128/64   Pulse 63   Resp 16   Wt 227 lb (103 kg)   BMI 37.77 kg/m  Gen:  WD/WN, NAD Head: /AT, No temporalis wasting. Ear/Nose/Throat: Hearing grossly intact, nares w/o erythema or drainage, trachea midline Eyes: Conjunctiva clear. Sclera non-icteric Neck: Supple.  No JVD.  Pulmonary:  Good air movement, no use of accessory muscles.  Cardiac: RRR, normal S1, S2 Vascular:  Vessel Right Left  Radial Palpable Palpable                          PT Trace Palpable 1+ Palpable  DP 1+ Palpable 1+ Palpable   Gastrointestinal: soft, non-tender/non-distended.  Musculoskeletal: M/S 5/5 throughout. 1-2+ bilateral lower extremity edema. Significant stasis changes are present bilaterally. Walking with a walker today Neurologic: Sensation grossly intact in extremities.  Symmetrical.  Speech is fluent.  Psychiatric: Judgment fair, poor historian. Mood & affect appropriate for pt's clinical situation. Dermatologic: No rashes or ulcers noted.  No cellulitis or open wounds.       Labs No results found for this or any previous visit (from the past 2160 hour(s)).  Radiology No results found.    Assessment/Plan  Essential (primary) hypertension lipid control important in reducing the progression of atherosclerotic disease. Continue statin therapy   Combined fat and carbohydrate induced hyperlipemia lipid control important in reducing the progression of atherosclerotic disease. Continue statin therapy   Chronic renal failure Likely contributing to his lower extremity swelling somewhat. Followed by nephrology.  Swelling of limb Recommended the regular use of compression stockings, leg elevation, and increasing his activity. This has been  recommended previously and he has not really followed through with it. Venous disease is likely contributing, but he is not interested in any intervention at this time.  Chronic venous insufficiency His noninvasive studies today show completely normal arterial perfusion with normal triphasic waveforms bilaterally and ABIs of 1.3 on the right and 1.29 on the left. His venous reflux study demonstrates continued ablation of both great saphenous veins with reflux in both small saphenous veins. No DVT or superficial thrombophlebitis is identified. These findings, clearly no arterial insufficiency is causing his symptoms. Venous insufficiency is likely contributing. I have offered him laser ablation of the small saphenous veins. He does not want any intervention currently. I strongly recommended he wear his compression stockings and elevate his legs more. We'll plan to see him back in 6 months or sooner if problems develop in the interim.  Festus Barren, MD  05/13/2016 5:34 PM    This note was created with Dragon medical transcription system.  Any errors from dictation are purely unintentional

## 2016-05-13 NOTE — Assessment & Plan Note (Signed)
His noninvasive studies today show completely normal arterial perfusion with normal triphasic waveforms bilaterally and ABIs of 1.3 on the right and 1.29 on the left. His venous reflux study demonstrates continued ablation of both great saphenous veins with reflux in both small saphenous veins. No DVT or superficial thrombophlebitis is identified. These findings, clearly no arterial insufficiency is causing his symptoms. Venous insufficiency is likely contributing. I have offered him laser ablation of the small saphenous veins. He does not want any intervention currently. I strongly recommended he wear his compression stockings and elevate his legs more. We'll plan to see him back in 6 months or sooner if problems develop in the interim.

## 2016-11-14 ENCOUNTER — Ambulatory Visit (INDEPENDENT_AMBULATORY_CARE_PROVIDER_SITE_OTHER): Payer: Medicare Other | Admitting: Vascular Surgery

## 2016-11-14 ENCOUNTER — Encounter (INDEPENDENT_AMBULATORY_CARE_PROVIDER_SITE_OTHER): Payer: Self-pay | Admitting: Vascular Surgery

## 2016-11-14 VITALS — BP 134/68 | HR 64 | Resp 16 | Ht 68.0 in | Wt 224.0 lb

## 2016-11-14 DIAGNOSIS — I739 Peripheral vascular disease, unspecified: Secondary | ICD-10-CM

## 2016-11-14 DIAGNOSIS — R7303 Prediabetes: Secondary | ICD-10-CM | POA: Diagnosis not present

## 2016-11-14 DIAGNOSIS — E782 Mixed hyperlipidemia: Secondary | ICD-10-CM | POA: Diagnosis not present

## 2016-11-14 DIAGNOSIS — I872 Venous insufficiency (chronic) (peripheral): Secondary | ICD-10-CM | POA: Diagnosis not present

## 2016-11-14 NOTE — Progress Notes (Signed)
Subjective:    Patient ID: Kyle Spence, male    DOB: 1932/05/10, 83 y.o.   MRN: [id number] Chief Complaint  Patient presents with  . Follow-up    81mo follow up   Patient presents for a 6 month chronic venous insufficiency follow-up. The patient presents today without complaint. The patient continues to engage in conservative therapy including wearing medical grade 1 compression stockings, elevating his legs and remaining as active as possible. Patient feels his symptoms are controlled. The patient denies any lower extremity pain, uncontrollable swelling, rest pain or ulceration. Patient denies any fever, nausea or vomiting.   Review of Systems  Constitutional: Negative.   HENT: Negative.   Eyes: Negative.   Respiratory: Negative.   Cardiovascular: Negative.   Gastrointestinal: Negative.   Endocrine: Negative.   Genitourinary: Negative.   Musculoskeletal: Negative.   Skin: Negative.   Allergic/Immunologic: Negative.   Neurological: Negative.   Hematological: Negative.   Psychiatric/Behavioral: Negative.       Objective:   Physical Exam  Constitutional: He is oriented to person, place, and time. He appears well-developed and well-nourished. No distress.  HENT:  Head: Normocephalic and atraumatic.  Eyes: Pupils are equal, round, and reactive to light. Conjunctivae are normal.  Neck: Normal range of motion.  Cardiovascular: Normal rate, regular rhythm, normal heart sounds and intact distal pulses.   Pulses:      Radial pulses are 2+ on the right side, and 2+ on the left side.       Dorsalis pedis pulses are 2+ on the right side, and 2+ on the left side.       Posterior tibial pulses are 2+ on the right side, and 2+ on the left side.  Pulmonary/Chest: Effort normal.  Musculoskeletal: Normal range of motion. He exhibits edema (mild nonpitting bilateral lower extremity edema noted).  Neurological: He is alert and oriented to person, place, and time.  Skin: He is not  diaphoretic.  Patient with moderate to severe stasis dermatitis There are no skin changes There is no cellulitis Skin is intact  Psychiatric: He has a normal mood and affect. His behavior is normal. Judgment and thought content normal.  Vitals reviewed.  BP 134/68 (BP Location: Right Arm)   Pulse 64   Resp 16   Ht 5\' 8"  (1.727 m)   Wt 224 lb (101.6 kg)   BMI 34.06 kg/m   Past Medical History:  Diagnosis Date  . Adiposity 02/07/2015  . Arthritis, degenerative 02/07/2015  . Borderline diabetes mellitus 02/07/2015  . Chronic constipation 02/07/2015  . Chronic renal failure 02/07/2015  . Combined fat and carbohydrate induced hyperlipemia 02/07/2015  . 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 Edema leg 02/07/2015   Overview:  chronic   . Essential (primary) hypertension 02/07/2015  . H/O: gout 04/26/2014  . Peripheral nerve disease 02/07/2015   Social History   Social History  . Marital status: Married    Spouse name: N/A  . Number of children: N/A  . Years of education: N/A   Occupational History  . Not on file.   Social History Main Topics  . Smoking status: Never Smoker  . Smokeless tobacco: Never Used  . Alcohol use No  . Drug use: No  . Sexual activity: Not on file   Other Topics Concern  . Not on file   Social History Narrative  . No narrative on file   Past Surgical History:  Procedure Laterality Date  . GREEN LIGHT LASER TURP (TRANSURETHRAL RESECTION  OF PROSTATE    . HERNIA REPAIR    . STOMACH SURGERY     child  . TONSILLECTOMY     Family History  Problem Relation Age of Onset  . Prostate cancer Neg Hx   . Kidney cancer Neg Hx   . Bladder Cancer Neg Hx    Allergies  Allergen Reactions  . Penicillins Swelling      Assessment & Plan:  Patient presents for a 6 month chronic venous insufficiency follow-up. The patient presents today without complaint. The patient continues to engage in conservative therapy including wearing medical grade 1 compression  stockings, elevating his legs and remaining as active as possible. Patient feels his symptoms are controlled. The patient denies any lower extremity pain, uncontrollable swelling, rest pain or ulceration. Patient denies any fever, nausea or vomiting.  1. Peripheral artery disease (HCC) - Stable Patient with multiple risk factors for peripheral artery disease I will continue to monitor with a yearly ABI I have discussed with the patient at length the risk factors for and pathogenesis of atherosclerotic disease and encouraged a healthy diet, regular exercise regimen and blood pressure / glucose control.  The patient was encouraged to call the office in the interim if he experiences any claudication like symptoms, rest pain or ulcers to his feet / toes.  - VAS [place] ABI WITH/WO TBI; Future  2. Chronic venous insufficiency - stable Patient's symptoms seem to be controlled by engaging in conservative therapy Patient is to continue wearing his medical grade 1 compression stockings, elevating his legs and remaining active as possible We discussed a lymphedema pump if conventional therapy goes not work. The patient was advised to follow up in six months to assess his progress.  The patient was instructed to call the office in the interim if any worsening edema or ulcerations to the legs, feet or toes occurs. The patient expresses their understanding.  3. Combined fat and carbohydrate induced hyperlipemia - stable Encouraged good control as its slows the progression of atherosclerotic disease  4. Borderline diabetes mellitus - stable Encouraged good control as its slows the progression of atherosclerotic disease  Current Outpatient Prescriptions on File Prior to Visit  Medication Sig Dispense Refill  . allopurinol (ZYLOPRIM) 100 MG tablet   3  . aspirin EC 81 MG tablet Take by mouth.    Marland Kitchen atorvastatin (LIPITOR) 40 MG tablet     . docusate sodium (COLACE) 50 MG capsule Take by mouth.    . enalapril  (VASOTEC) 20 MG tablet     . glucosamine-chondroitin 500-400 MG tablet Take 1 tablet by mouth 3 (three) times daily.    . Glycerin-Hypromellose-PEG 400 ([facility] DRY EYE RELIEF) 0.2-0.2-1 % SOLN Apply to eye.    . mirabegron ER (MYRBETRIQ) 25 MG TB24 tablet Take 1 tablet (25 mg total) by mouth daily. (Patient taking differently: Take 50 mg by mouth daily. ) 30 tablet 11  . Multiple Vitamin (MULTI-VITAMINS) TABS Take by mouth.    . Nutritional Supplements (PROSTATE 2.4) CAPS Take by mouth.    . verapamil (VERELAN PM) 240 MG 24 hr capsule      No current facility-administered medications on file prior to visit.     There are no Patient Instructions on file for this visit. No Follow-up on file.   Sophia Sperry A Lanelle Lindo, PA-C

## 2017-05-15 ENCOUNTER — Ambulatory Visit (INDEPENDENT_AMBULATORY_CARE_PROVIDER_SITE_OTHER): Payer: Medicare Other | Admitting: Vascular Surgery

## 2017-05-15 ENCOUNTER — Encounter (INDEPENDENT_AMBULATORY_CARE_PROVIDER_SITE_OTHER): Payer: Medicare Other

## 2017-06-05 ENCOUNTER — Encounter (INDEPENDENT_AMBULATORY_CARE_PROVIDER_SITE_OTHER): Payer: Self-pay

## 2017-06-05 ENCOUNTER — Ambulatory Visit (INDEPENDENT_AMBULATORY_CARE_PROVIDER_SITE_OTHER): Payer: Medicare Other | Admitting: Vascular Surgery

## 2017-06-05 ENCOUNTER — Ambulatory Visit (INDEPENDENT_AMBULATORY_CARE_PROVIDER_SITE_OTHER): Payer: Medicare Other

## 2017-06-05 DIAGNOSIS — I739 Peripheral vascular disease, unspecified: Secondary | ICD-10-CM | POA: Diagnosis not present

## 2019-07-27 ENCOUNTER — Other Ambulatory Visit: Payer: Self-pay | Admitting: Family Medicine

## 2019-07-27 DIAGNOSIS — R531 Weakness: Secondary | ICD-10-CM

## 2019-07-27 DIAGNOSIS — R4182 Altered mental status, unspecified: Secondary | ICD-10-CM

## 2019-07-27 DIAGNOSIS — G309 Alzheimer's disease, unspecified: Secondary | ICD-10-CM

## 2019-08-16 ENCOUNTER — Other Ambulatory Visit: Payer: Self-pay

## 2019-08-16 ENCOUNTER — Ambulatory Visit
Admission: RE | Admit: 2019-08-16 | Discharge: 2019-08-16 | Disposition: A | Discharge status: Home / Self Care | Payer: Medicare Other | Source: Ambulatory Visit | Attending: Family Medicine | Admitting: Family Medicine

## 2019-08-16 DIAGNOSIS — R4182 Altered mental status, unspecified: Secondary | ICD-10-CM | POA: Diagnosis present

## 2019-08-16 DIAGNOSIS — R531 Weakness: Secondary | ICD-10-CM

## 2019-08-16 DIAGNOSIS — F0280 Dementia in other diseases classified elsewhere without behavioral disturbance: Secondary | ICD-10-CM | POA: Insufficient documentation

## 2019-08-16 DIAGNOSIS — G309 Alzheimer's disease, unspecified: Secondary | ICD-10-CM | POA: Insufficient documentation

## 2019-09-07 ENCOUNTER — Encounter: Payer: Self-pay | Admitting: Urology

## 2019-09-07 ENCOUNTER — Ambulatory Visit (INDEPENDENT_AMBULATORY_CARE_PROVIDER_SITE_OTHER): Payer: Medicare Other | Admitting: Urology

## 2019-09-07 ENCOUNTER — Other Ambulatory Visit: Payer: Self-pay

## 2019-09-07 VITALS — BP 104/64 | HR 78 | Ht 68.0 in | Wt 218.0 lb

## 2019-09-07 DIAGNOSIS — N3941 Urge incontinence: Secondary | ICD-10-CM

## 2019-09-07 DIAGNOSIS — R339 Retention of urine, unspecified: Secondary | ICD-10-CM

## 2019-09-07 DIAGNOSIS — R8271 Bacteriuria: Secondary | ICD-10-CM | POA: Diagnosis not present

## 2019-09-07 DIAGNOSIS — R32 Unspecified urinary incontinence: Secondary | ICD-10-CM | POA: Diagnosis not present

## 2019-09-07 LAB — BLADDER SCAN AMB NON-IMAGING: Scan Result: 124

## 2019-09-08 ENCOUNTER — Encounter: Payer: Self-pay | Admitting: Urology

## 2019-09-08 NOTE — Progress Notes (Signed)
09/07/2019 7:50 AM   Kyle Spence April 14, 1932 [id number]  Referring provider: Marisue Ivan, MD [phone] [facility] [address] [facility] [place],  [place] [address]  Chief Complaint  Patient presents with  . Urinary Incontinence    HPI: 86 y.o. male seen at the request of Dr. Burnadette Pop for evaluation of urinary incontinence.  He presents today with his son who provided >50% of the history.   6-7-year history of urinary incontinence  Currently wearing diapers  + Frequency, urgency with urge incontinence and continuous urinary leakage  + Nocturia with nocturnal enuresis; will soak through diaper  Significant decreased mobility with getting to the toilet  + Ankle edema and on Lasix  Recurrent bacteriuria however treatment does not improve symptoms  Saw Dr. Evelene Croon for several years; prior PVP  Prior history stone disease  Followed by Dr. Wynelle Link for CKD  Denies gross hematuria  Denies flank, abdominal or pelvic pain  Last imaging RUS 2017  Was seen here 2017; cystoscopy showed no evidence of stricture bladder neck contracture; PVR was 113 mL  Given a trial of Myrbetriq without improvement   PMH: Past Medical History:  Diagnosis Date  . Adiposity 02/07/2015  . Arthritis, degenerative 02/07/2015  . Borderline diabetes mellitus 02/07/2015  . Chronic constipation 02/07/2015  . Chronic renal failure 02/07/2015  . Combined fat and carbohydrate induced hyperlipemia 02/07/2015  . 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 Edema leg 02/07/2015   Overview:  chronic   . Essential (primary) hypertension 02/07/2015  . H/O: gout 04/26/2014  . Peripheral nerve disease 02/07/2015    Surgical History: Past Surgical History:  Procedure Laterality Date  . GREEN LIGHT LASER TURP (TRANSURETHRAL RESECTION OF PROSTATE    . HERNIA REPAIR    . STOMACH SURGERY     child  . TONSILLECTOMY      Home Medications:  Allergies as of 09/07/2019      Reactions   Enalapril Other  (See Comments)   HYPERKALEMIA    Penicillins Swelling      Medication List       Accurate as of September 07, 2019 11:59 PM. If you have any questions, ask your nurse or doctor.        allopurinol 100 MG tablet Commonly known as: ZYLOPRIM   aspirin EC 81 MG tablet Take by mouth.   atorvastatin 40 MG tablet Commonly known as: LIPITOR   [facility]S Dry Eye Relief 0.2-0.2-1 % Soln Generic drug: Glycerin-Hypromellose-PEG 400 Apply to eye.   docusate sodium 50 MG capsule Commonly known as: COLACE Take by mouth.   enalapril 20 MG tablet Commonly known as: VASOTEC   glucosamine-chondroitin 500-400 MG tablet Take 1 tablet by mouth 3 (three) times daily.   mirabegron ER 25 MG Tb24 tablet Commonly known as: MYRBETRIQ Take 1 tablet (25 mg total) by mouth daily. What changed: how much to take   Multi-Vitamins Tabs Take by mouth.   Prostate 2.4 Caps Take by mouth.   verapamil 240 MG 24 hr capsule Commonly known as: VERELAN PM       Allergies:  Allergies  Allergen Reactions  . Enalapril Other (See Comments)    HYPERKALEMIA   . Penicillins Swelling    Family History: Family History  Problem Relation Age of Onset  . Prostate cancer Neg Hx   . Kidney cancer Neg Hx   . Bladder Cancer Neg Hx     Social History:  reports that he has never smoked. He has never used smokeless tobacco. He  reports that he does not drink alcohol and does not use drugs.   Physical Exam: BP 104/64   Pulse 78   Ht 5\' 8"  (1.727 m)   Wt 218 lb (98.9 kg)   BMI 33.15 kg/m   Constitutional:  Alert, No acute distress. HEENT: [place] AT, moist mucus membranes.  Trachea midline, no masses. Cardiovascular: No clubbing, cyanosis, or edema. Respiratory: Normal respiratory effort, no increased work of breathing. GI: Abdomen is soft, nontender, nondistended, no abdominal masses. Skin: No rashes, bruises or suspicious lesions. Neurologic: Grossly intact, no focal deficits, moving all 4  extremities. Psychiatric: Normal mood and affect.    Assessment & Plan:    1. Urinary incontinence  PVR stable at 124 mL  Would avoid anticholinergic medication in his age group  He does not remember taking Myrbetriq and will give 1 month of samples 25 mg  Follow-up 4 weeks for symptom reassessment  Last upper tract imaging was 2017; renal ultrasound scheduled  He has longstanding incontinence and significant improvement may not be obtainable  We did discuss various external urinary collection devices  2.  Chronic bacteriuria  Treatment has not improved his lower urinary tract symptoms  Would not recommend additional treatment unless he develops clinical UTI  3.  Incomplete bladder emptying  PVR stable    2018, MD  [facility] [address] [place], [place] [phone]

## 2019-10-05 ENCOUNTER — Ambulatory Visit: Payer: Medicare Other

## 2019-10-07 ENCOUNTER — Ambulatory Visit: Payer: Medicare Other

## 2019-10-10 ENCOUNTER — Ambulatory Visit: Payer: Medicare Other

## 2019-10-12 ENCOUNTER — Ambulatory Visit: Payer: Self-pay | Admitting: Urology

## 2019-11-02 ENCOUNTER — Ambulatory Visit: Payer: Self-pay | Admitting: Urology

## 2020-12-16 ENCOUNTER — Other Ambulatory Visit: Payer: Self-pay

## 2020-12-16 ENCOUNTER — Emergency Department: Payer: Medicare Other

## 2020-12-16 ENCOUNTER — Encounter: Payer: Self-pay | Admitting: Emergency Medicine

## 2020-12-16 ENCOUNTER — Inpatient Hospital Stay
Admission: EM | Admit: 2020-12-16 | Discharge: 2020-12-19 | DRG: 522 | Disposition: A | Discharge status: Skilled Nursing Facility | Payer: Medicare Other | Attending: Internal Medicine | Admitting: Internal Medicine

## 2020-12-16 DIAGNOSIS — M1991 Primary osteoarthritis, unspecified site: Secondary | ICD-10-CM | POA: Diagnosis not present

## 2020-12-16 DIAGNOSIS — N400 Enlarged prostate without lower urinary tract symptoms: Secondary | ICD-10-CM | POA: Diagnosis present

## 2020-12-16 DIAGNOSIS — G629 Polyneuropathy, unspecified: Secondary | ICD-10-CM

## 2020-12-16 DIAGNOSIS — Z8739 Personal history of other diseases of the musculoskeletal system and connective tissue: Secondary | ICD-10-CM

## 2020-12-16 DIAGNOSIS — N3000 Acute cystitis without hematuria: Secondary | ICD-10-CM

## 2020-12-16 DIAGNOSIS — D72829 Elevated white blood cell count, unspecified: Secondary | ICD-10-CM | POA: Diagnosis present

## 2020-12-16 DIAGNOSIS — Z96649 Presence of unspecified artificial hip joint: Secondary | ICD-10-CM

## 2020-12-16 DIAGNOSIS — S72012A Unspecified intracapsular fracture of left femur, initial encounter for closed fracture: Principal | ICD-10-CM | POA: Diagnosis present

## 2020-12-16 DIAGNOSIS — R739 Hyperglycemia, unspecified: Secondary | ICD-10-CM | POA: Diagnosis present

## 2020-12-16 DIAGNOSIS — S72002A Fracture of unspecified part of neck of left femur, initial encounter for closed fracture: Secondary | ICD-10-CM | POA: Diagnosis present

## 2020-12-16 DIAGNOSIS — Z01818 Encounter for other preprocedural examination: Secondary | ICD-10-CM

## 2020-12-16 DIAGNOSIS — K5909 Other constipation: Secondary | ICD-10-CM | POA: Diagnosis present

## 2020-12-16 DIAGNOSIS — I10 Essential (primary) hypertension: Secondary | ICD-10-CM | POA: Diagnosis not present

## 2020-12-16 DIAGNOSIS — W19XXXA Unspecified fall, initial encounter: Secondary | ICD-10-CM

## 2020-12-16 DIAGNOSIS — N179 Acute kidney failure, unspecified: Secondary | ICD-10-CM | POA: Diagnosis not present

## 2020-12-16 DIAGNOSIS — I129 Hypertensive chronic kidney disease with stage 1 through stage 4 chronic kidney disease, or unspecified chronic kidney disease: Secondary | ICD-10-CM | POA: Diagnosis present

## 2020-12-16 DIAGNOSIS — M1990 Unspecified osteoarthritis, unspecified site: Secondary | ICD-10-CM | POA: Diagnosis present

## 2020-12-16 DIAGNOSIS — N390 Urinary tract infection, site not specified: Secondary | ICD-10-CM | POA: Diagnosis not present

## 2020-12-16 DIAGNOSIS — E8770 Fluid overload, unspecified: Secondary | ICD-10-CM | POA: Diagnosis not present

## 2020-12-16 DIAGNOSIS — I739 Peripheral vascular disease, unspecified: Secondary | ICD-10-CM | POA: Diagnosis present

## 2020-12-16 DIAGNOSIS — F03A0 Unspecified dementia, mild, without behavioral disturbance, psychotic disturbance, mood disturbance, and anxiety: Secondary | ICD-10-CM

## 2020-12-16 DIAGNOSIS — F0390 Unspecified dementia without behavioral disturbance: Secondary | ICD-10-CM

## 2020-12-16 DIAGNOSIS — W010XXA Fall on same level from slipping, tripping and stumbling without subsequent striking against object, initial encounter: Secondary | ICD-10-CM | POA: Diagnosis present

## 2020-12-16 DIAGNOSIS — S72009A Fracture of unspecified part of neck of unspecified femur, initial encounter for closed fracture: Secondary | ICD-10-CM | POA: Diagnosis present

## 2020-12-16 DIAGNOSIS — R7301 Impaired fasting glucose: Secondary | ICD-10-CM

## 2020-12-16 DIAGNOSIS — Z79899 Other long term (current) drug therapy: Secondary | ICD-10-CM

## 2020-12-16 DIAGNOSIS — E785 Hyperlipidemia, unspecified: Secondary | ICD-10-CM

## 2020-12-16 DIAGNOSIS — Z7982 Long term (current) use of aspirin: Secondary | ICD-10-CM

## 2020-12-16 DIAGNOSIS — M25552 Pain in left hip: Secondary | ICD-10-CM | POA: Diagnosis not present

## 2020-12-16 DIAGNOSIS — Y92012 Bathroom of single-family (private) house as the place of occurrence of the external cause: Secondary | ICD-10-CM

## 2020-12-16 DIAGNOSIS — N1832 Chronic kidney disease, stage 3b: Secondary | ICD-10-CM

## 2020-12-16 DIAGNOSIS — Z20822 Contact with and (suspected) exposure to covid-19: Secondary | ICD-10-CM | POA: Diagnosis present

## 2020-12-16 DIAGNOSIS — M109 Gout, unspecified: Secondary | ICD-10-CM | POA: Diagnosis present

## 2020-12-16 LAB — URINALYSIS, ROUTINE W REFLEX MICROSCOPIC
Bilirubin Urine: NEGATIVE
Glucose, UA: NEGATIVE mg/dL
Hgb urine dipstick: NEGATIVE
Ketones, ur: NEGATIVE mg/dL
Nitrite: NEGATIVE
Protein, ur: 100 mg/dL — AB
Specific Gravity, Urine: 1.017 (ref 1.005–1.030)
Squamous Epithelial / HPF: NONE SEEN (ref 0–5)
WBC, UA: >50 WBC/hpf — ABNORMAL HIGH (ref 0–5)
pH: 6 (ref 5.0–8.0)

## 2020-12-16 LAB — PROTIME-INR
INR: 1.1 (ref 0.8–1.2)
Prothrombin Time: 14.5 seconds (ref 11.4–15.2)

## 2020-12-16 LAB — CBC
HCT: 42.4 % (ref 39.0–52.0)
Hemoglobin: 14 g/dL (ref 13.0–17.0)
MCH: 32.1 pg (ref 26.0–34.0)
MCHC: 33 g/dL (ref 30.0–36.0)
MCV: 97.2 fL (ref 80.0–100.0)
Platelets: 217 10*3/uL (ref 150–400)
RBC: 4.36 MIL/uL (ref 4.22–5.81)
RDW: 13.4 % (ref 11.5–15.5)
WBC: 15.5 10*3/uL — ABNORMAL HIGH (ref 4.0–10.5)
nRBC: 0 % (ref 0.0–0.2)

## 2020-12-16 LAB — RESP PANEL BY RT-PCR (FLU A&B, COVID) ARPGX2
Influenza A by PCR: NEGATIVE
Influenza B by PCR: NEGATIVE
SARS Coronavirus 2 by RT PCR: NEGATIVE

## 2020-12-16 LAB — BASIC METABOLIC PANEL
Anion gap: 8 (ref 5–15)
BUN: 39 mg/dL — ABNORMAL HIGH (ref 8–23)
CO2: 26 mmol/L (ref 22–32)
Calcium: 9.4 mg/dL (ref 8.9–10.3)
Chloride: 100 mmol/L (ref 98–111)
Creatinine, Ser: 1.77 mg/dL — ABNORMAL HIGH (ref 0.61–1.24)
GFR, Estimated: 37 mL/min — ABNORMAL LOW (ref >60)
Glucose, Bld: 292 mg/dL — ABNORMAL HIGH (ref 70–99)
Potassium: 4.8 mmol/L (ref 3.5–5.1)
Sodium: 134 mmol/L — ABNORMAL LOW (ref 135–145)

## 2020-12-16 LAB — TYPE AND SCREEN
ABO/RH(D): O POS
Antibody Screen: NEGATIVE

## 2020-12-16 LAB — CK: Total CK: 136 U/L (ref 49–397)

## 2020-12-16 MED ORDER — HYDROCODONE-ACETAMINOPHEN 5-325 MG PO TABS: 1.0000 | ORAL_TABLET | Freq: Four times a day (QID) | ORAL | Status: DC | PRN

## 2020-12-16 MED ORDER — MORPHINE SULFATE (PF) 2 MG/ML IV SOLN: 0.5000 mg | INTRAVENOUS | Status: DC | PRN

## 2020-12-16 MED ORDER — CEFAZOLIN SODIUM-DEXTROSE 1-4 GM/50ML-% IV SOLN
1.0000 g | INTRAVENOUS | Status: AC
  Administered 2020-12-17: 1 g via INTRAVENOUS
  Filled 2020-12-16 (×2): qty 50

## 2020-12-16 MED ORDER — ONDANSETRON HCL 4 MG/2ML IJ SOLN: 4.0000 mg | Freq: Three times a day (TID) | INTRAMUSCULAR | Status: DC | PRN

## 2020-12-16 MED ORDER — INSULIN ASPART 100 UNIT/ML IJ SOLN
0.0000 [IU] | Freq: Three times a day (TID) | INTRAMUSCULAR | Status: DC
  Administered 2020-12-19: 2 [IU] via SUBCUTANEOUS

## 2020-12-16 MED ORDER — HEPARIN SODIUM (PORCINE) 5000 UNIT/ML IJ SOLN: 5000.0000 [IU] | Freq: Three times a day (TID) | INTRAMUSCULAR | Status: DC

## 2020-12-16 MED ORDER — ATORVASTATIN CALCIUM 20 MG PO TABS
40.0000 mg | ORAL_TABLET | Freq: Every day | ORAL | Status: DC
  Administered 2020-12-17 – 2020-12-18 (×3): 40 mg via ORAL
  Filled 2020-12-16 (×3): qty 2

## 2020-12-16 MED ORDER — ONDANSETRON HCL 4 MG/2ML IJ SOLN
4.0000 mg | Freq: Once | INTRAMUSCULAR | Status: AC
  Administered 2020-12-16: 4 mg via INTRAVENOUS
  Filled 2020-12-16: qty 2

## 2020-12-16 MED ORDER — SODIUM CHLORIDE 0.9 % IV SOLN: INTRAVENOUS | Status: DC

## 2020-12-16 MED ORDER — HYDROCODONE-ACETAMINOPHEN 5-325 MG PO TABS
1.0000 | ORAL_TABLET | ORAL | Status: DC | PRN
  Administered 2020-12-17 – 2020-12-19 (×7): 1 via ORAL
  Filled 2020-12-16 (×7): qty 1

## 2020-12-16 MED ORDER — MORPHINE SULFATE (PF) 2 MG/ML IV SOLN
1.0000 mg | INTRAVENOUS | Status: DC | PRN
  Administered 2020-12-17 (×3): 1 mg via INTRAVENOUS
  Filled 2020-12-16 (×3): qty 1

## 2020-12-16 MED ORDER — MORPHINE SULFATE (PF) 4 MG/ML IV SOLN
4.0000 mg | Freq: Once | INTRAVENOUS | Status: AC
  Administered 2020-12-16: 4 mg via INTRAVENOUS
  Filled 2020-12-16: qty 1

## 2020-12-16 MED ORDER — CHLORHEXIDINE GLUCONATE 4 % EX LIQD: 1.0000 "application " | Freq: Once | CUTANEOUS | Status: DC

## 2020-12-16 MED ORDER — CLINDAMYCIN PHOSPHATE 600 MG/50ML IV SOLN
600.0000 mg | INTRAVENOUS | Status: AC
  Administered 2020-12-17: 600 mg via INTRAVENOUS
  Filled 2020-12-16 (×2): qty 50

## 2020-12-16 MED ORDER — INSULIN ASPART 100 UNIT/ML IJ SOLN: 0.0000 [IU] | Freq: Every day | INTRAMUSCULAR | Status: DC

## 2020-12-16 NOTE — ED Triage Notes (Signed)
Pt in via EMS from home with c/o fall. EMS reports pt fell 45 minutes ago and pt has been weak for the past 2 days. Pt denies pain or injuries but is not able to stand unassisted but is not his baseline FSBS 309, HR 90's, 100% RA, 98.2 tmep, 209/97

## 2020-12-16 NOTE — ED Provider Notes (Signed)
[facility] Emergency Department Provider Note  ____________________________________________  Time seen: Approximately 10:28 PM  I have reviewed the triage vital signs and the nursing notes.   HISTORY  Chief Complaint Fall and Weakness    Level 5 Caveat: Portions of the History and Physical including HPI and review of systems are unable to be completely obtained due to patient chronic dementia  HPI Kyle Spence is a 87 y.o. male with a history of hypertension and mild dementia who lives alone who was in his usual state of health until having a mechanical fall today at home.  His son came to check on him and found him on the floor, unable to get up and bear weight on his left leg.  Patient reports pain in the hip, no other symptoms.  Denies head injury or loss of consciousness.    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   Edema leg 02/07/2015   Overview:  chronic    Essential (primary) hypertension 02/07/2015   H/O: gout 04/26/2014   Peripheral nerve disease 02/07/2015     Patient Active Problem List   Diagnosis Date Noted   Peripheral artery disease ([place]) 05/13/2016   Swelling of limb 03/11/2016   Pain in limb 03/11/2016   Borderline diabetes mellitus 02/07/2015   Chronic constipation 02/07/2015   Chronic renal failure 02/07/2015   Essential (primary) hypertension 02/07/2015   Edema leg 02/07/2015   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 Authorizing Provider  allopurinol (ZYLOPRIM) 100 MG tablet  01/20/15   [provider]  aspirin EC 81 MG tablet Take by mouth.    [provider]  atorvastatin (LIPITOR) 40 MG tablet  12/28/14   [provider]  docusate sodium (COLACE) 50 MG capsule Take by mouth.    [provider]  enalapril (VASOTEC) 20 MG tablet  12/28/14   [provider]  glucosamine-chondroitin 500-400 MG tablet Take 1 tablet by mouth 3 (three) times daily.    [provider]  Glycerin-Hypromellose-PEG 400 ([facility] DRY EYE RELIEF) 0.2-0.2-1 % SOLN Apply to eye.    [provider]  mirabegron ER (MYRBETRIQ) 25 MG TB24 tablet Take 1 tablet (25 mg total) by mouth daily. Patient taking differently: Take 50 mg by mouth daily.  03/26/15   Nickie Retort, MD  Multiple Vitamin (MULTI-VITAMINS) TABS Take by mouth.    [provider]  Nutritional Supplements (PROSTATE 2.4) CAPS Take by mouth.    [provider]  verapamil (VERELAN PM) 240 MG 24 hr capsule  01/17/15   [provider]     Allergies Enalapril and Penicillins   Family History  Problem Relation Age of Onset   Prostate cancer Neg Hx    Kidney cancer Neg  Hx    Bladder Cancer Neg Hx     Social History Social History   Tobacco Use   Smoking status: Never   Smokeless tobacco: Never  Substance Use Topics   Alcohol use: No    Alcohol/week: 0.0 standard drinks   Drug use: No    Review of Systems Level 5 Caveat: Portions of the History and Physical including HPI and review of systems are unable to be completely obtained due to patient being a poor historian   Constitutional:   No known fever.  ENT:   No rhinorrhea. Cardiovascular:   No chest pain or  syncope. Respiratory:   No dyspnea or cough. Gastrointestinal:   Negative for abdominal pain, vomiting and diarrhea.  Musculoskeletal:   Left hip pain as above ____________________________________________   PHYSICAL EXAM:  VITAL SIGNS: ED Triage Vitals  Enc Vitals Group     BP 12/16/20 1605 (!) 178/87     Pulse Rate 12/16/20 1605 88     Resp 12/16/20 1605 20     Temp 12/16/20 1605 98.8 F (37.1 C)     Temp Source 12/16/20 1605 Oral     SpO2 12/16/20 1605 97 %     Weight 12/16/20 1601 218 lb 4.1 oz (99 kg)     Height 12/16/20 1601 5\' 8"  (1.727 m)     Head Circumference --      Peak Flow --      Pain Score --      Pain Loc --      Pain Edu? --      Excl. in GC? --     Vital signs reviewed, nursing assessments reviewed.   Constitutional:   Alert and oriented to person and place. Non-toxic appearance. Eyes:   Conjunctivae are normal. EOMI. PERRL. ENT      Head:   Normocephalic and atraumatic.      Nose:   No congestion/rhinnorhea.       Mouth/Throat:   MMM, no pharyngeal erythema. No peritonsillar mass.       Neck:   No meningismus. Full ROM.  No C-spine tenderness Hematological/Lymphatic/Immunilogical:   No cervical lymphadenopathy. Cardiovascular:   RRR. Symmetric bilateral radial and DP pulses.  No murmurs. Cap refill less than 2 seconds. Respiratory:   Normal respiratory effort without tachypnea/retractions. Breath sounds are clear and equal bilaterally. No wheezes/rales/rhonchi. Gastrointestinal:   Soft and nontender. Non distended. There is no CVA tenderness.  No rebound, rigidity, or guarding. Genitourinary:   deferred Musculoskeletal:   Tenderness at left hip.  Left leg shortening.. Neurologic:   Normal speech and language.  Motor grossly intact. No acute focal neurologic deficits are appreciated.  Skin:    Skin is warm, dry and intact. No rash noted.  No petechiae, purpura, or bullae.  ____________________________________________    LABS (pertinent  positives/negatives) (all labs ordered are listed, but only abnormal results are displayed) Labs Reviewed  BASIC METABOLIC PANEL - Abnormal; Notable for the following components:      Result Value   Sodium 134 (*)    Glucose, Bld 292 (*)    BUN 39 (*)    Creatinine, Ser 1.77 (*)    GFR, Estimated 37 (*)    All other components within normal limits  CBC - Abnormal; Notable for the following components:   WBC 15.5 (*)    All other components within normal limits  RESP PANEL BY RT-PCR (FLU A&B, COVID) ARPGX2  CK  URINALYSIS, ROUTINE W REFLEX MICROSCOPIC  CBG  MONITORING, ED  TYPE AND SCREEN   ____________________________________________   EKG  Interpreted by me Normal sinus rhythm rate of 86, normal axis and intervals.  Normal QRS ST segments and T waves.  ____________________________________________    [id number]  DG Chest 2 View  Result Date: 12/16/2020 CLINICAL DATA:  Fall, hip pain EXAM: CHEST - 2 VIEW COMPARISON:  06/14/2004 FINDINGS: The heart size and mediastinal contours are within normal limits. Coarsened interstitial markings bilaterally. No lobar consolidation. No pleural effusion or pneumothorax. The visualized skeletal structures are unremarkable. IMPRESSION: 1. No active cardiopulmonary disease. 2. Coarsened interstitial markings bilaterally, which may represent chronic bronchitic type lung changes. Electronically Signed   By: Davina Poke D.O.   On: 12/16/2020 17:45   DG Lumbar Spine 2-3 Views  Result Date: 12/16/2020 CLINICAL DATA:  [place], weakness for 2 days EXAM: LUMBAR SPINE - 2-3 VIEW COMPARISON:  None. FINDINGS: Frontal and lateral views of the lumbar spine demonstrate 5 non-rib-bearing lumbar type vertebral bodies. There is left convex scoliosis centered at the L4 level. There is grade 1 anterolisthesis of L5 on S1. No acute fractures. Prominent multilevel spondylosis and facet hypertrophy from L2-3 through L5-S1. Sacroiliac joints appear unremarkable.  IMPRESSION: 1. Left convex scoliosis, with extensive multilevel lumbar spondylosis and facet hypertrophy. 2. No acute fracture. Electronically Signed   By: Randa Ngo M.D.   On: 12/16/2020 17:41   CT Head Wo Contrast  Result Date: 12/16/2020 CLINICAL DATA:  Fall, head trauma EXAM: CT HEAD WITHOUT CONTRAST CT CERVICAL SPINE WITHOUT CONTRAST TECHNIQUE: Multidetector CT imaging of the head and cervical spine was performed following the standard protocol without intravenous contrast. Multiplanar CT image reconstructions of the cervical spine were also generated. COMPARISON:  08/16/2019 FINDINGS: CT HEAD FINDINGS Brain: No evidence of acute infarction, hemorrhage, hydrocephalus, extra-axial collection or mass lesion/mass effect. Periventricular and deep white matter hypodensity. Mild global cerebral volume loss. Vascular: No hyperdense vessel or unexpected calcification. Skull: Normal. Negative for fracture or focal lesion. Sinuses/Orbits: No acute finding. Other: None. CT CERVICAL SPINE FINDINGS Alignment: Degenerative straightening of the normal cervical lordosis. Skull base and vertebrae: No acute fracture. No primary bone lesion or focal pathologic process. Soft tissues and spinal canal: No prevertebral fluid or swelling. No visible canal hematoma. Disc levels: Moderate to severe disc space height loss and osteophytosis from C4 through C7. Upper chest: Negative. Other: None. IMPRESSION: 1. No acute intracranial pathology. Small-vessel white matter disease and mild global cerebral volume loss. 2. No fracture or static subluxation of the cervical spine. 3. Moderate to severe disc space height loss and osteophytosis from C4 through C7. Electronically Signed   By: Delanna Ahmadi M.D.   On: 12/16/2020 17:01   CT Cervical Spine Wo Contrast  Result Date: 12/16/2020 CLINICAL DATA:  Fall, head trauma EXAM: CT HEAD WITHOUT CONTRAST CT CERVICAL SPINE WITHOUT CONTRAST TECHNIQUE: Multidetector CT imaging of the head  and cervical spine was performed following the standard protocol without intravenous contrast. Multiplanar CT image reconstructions of the cervical spine were also generated. COMPARISON:  08/16/2019 FINDINGS: CT HEAD FINDINGS Brain: No evidence of acute infarction, hemorrhage, hydrocephalus, extra-axial collection or mass lesion/mass effect. Periventricular and deep white matter hypodensity. Mild global cerebral volume loss. Vascular: No hyperdense vessel or unexpected calcification. Skull: Normal. Negative for fracture or focal lesion. Sinuses/Orbits: No acute finding. Other: None. CT CERVICAL SPINE FINDINGS Alignment: Degenerative straightening of the normal cervical lordosis. Skull base and vertebrae: No acute fracture. No primary bone lesion or focal pathologic process. Soft  tissues and spinal canal: No prevertebral fluid or swelling. No visible canal hematoma. Disc levels: Moderate to severe disc space height loss and osteophytosis from C4 through C7. Upper chest: Negative. Other: None. IMPRESSION: 1. No acute intracranial pathology. Small-vessel white matter disease and mild global cerebral volume loss. 2. No fracture or static subluxation of the cervical spine. 3. Moderate to severe disc space height loss and osteophytosis from C4 through C7. Electronically Signed   By: Jearld Lesch M.D.   On: 12/16/2020 17:01   DG Hip Unilat W or Wo Pelvis 2-3 Views Left  Result Date: 12/16/2020 CLINICAL DATA:  [place]are[place] Se[place], decreased movement EXAM: DG HIP (WITH OR WITHOUT PELVIS) 2-3V LEFT; DG HIP (WITH OR WITHOUT PELVIS) 2-3V RIGHT COMPARISON:  None. FINDINGS: Frontal view of the pelvis as well as frontal and frogleg lateral views of both hips are obtained. Left hip: There is a basicervical left femoral neck fracture, with mild impaction at the fracture site. No dislocation. Visualized portions of the left hemipelvis are unremarkable. Right hip: No fracture, subluxation, or dislocation. Joint spaces are well preserved.  Visualized portions of the right hemipelvis are unremarkable. IMPRESSION: 1. Basicervical left femoral neck fracture with mild impaction at the fracture site. 2. Unremarkable right hip. Electronically Signed   By: Sharlet Salina M.D.   On: 12/16/2020 17:42   DG HIP UNILAT WITH PELVIS 2-3 VIEWS RIGHT  Result Date: 12/16/2020 CLINICAL DATA:  [place], decreased movement EXAM: DG HIP (WITH OR WITHOUT PELVIS) 2-3V LEFT; DG HIP (WITH OR WITHOUT PELVIS) 2-3V RIGHT COMPARISON:  None. FINDINGS: Frontal view of the pelvis as well as frontal and frogleg lateral views of both hips are obtained. Left hip: There is a basicervical left femoral neck fracture, with mild impaction at the fracture site. No dislocation. Visualized portions of the left hemipelvis are unremarkable. Right hip: No fracture, subluxation, or dislocation. Joint spaces are well preserved. Visualized portions of the right hemipelvis are unremarkable. IMPRESSION: 1. Basicervical left femoral neck fracture with mild impaction at the fracture site. 2. Unremarkable right hip. Electronically Signed   By: Sharlet Salina M.D.   On: 12/16/2020 17:42    ____________________________________________   PROCEDURES Procedures  ____________________________________________    CLINICAL IMPRESSION / ASSESSMENT AND PLAN / ED COURSE  Medications ordered in the ED: Medications  morphine 4 MG/ML injection 4 mg (has no administration in time range)  ondansetron (ZOFRAN) injection 4 mg (has no administration in time range)    Pertinent labs & imaging results that were available during my care of the patient were reviewed by me and considered in my medical decision making (see chart for details).   Kyle Spence was evaluated in Emergency Department on 12/16/2020 for the symptoms described in the history of present illness. He was evaluated in the context of the global COVID-19 pandemic, which necessitated consideration that the patient might be at risk for  infection with the SARS-CoV-2 virus that causes COVID-19. Institutional protocols and algorithms that pertain to the evaluation of patients at risk for COVID-19 are in a state of rapid change based on information released by regulatory bodies including the CDC and federal and state organizations. These policies and algorithms were followed during the patient's care in the ED.   CT head and neck negative for intracranial hemorrhage or C-spine fracture.  Chest x-ray and right hip x-rays unremarkable.  Clinical Course as of 12/16/20 2228  Wynelle Link Dec 16, 2020  2227 Patient presents with mechanical fall, found to have a left  femoral neck fracture.  Discussed with orthopedics Dr. Sabra Heck who will plan for operative repair tomorrow, most likely afternoon/evening.  Patient agreeable to receiving some pain medication now, so given 4 mg IV morphine.  Will discuss with hospitalist for further management. [PS]    Clinical Course User Index [PS] Carrie Mew, MD     ____________________________________________   FINAL CLINICAL IMPRESSION(S) / ED DIAGNOSES    Final diagnoses:  Fall  Chronic dementia [facility])  Closed fracture of neck of left femur, initial encounter [facility])     ED Discharge Orders     None       Portions of this note were generated with dragon dictation software. Dictation errors may occur despite best attempts at proofreading.   Carrie Mew, MD 12/16/20 2312

## 2020-12-16 NOTE — H&P (Signed)
History and Physical   Kyle Spence [id number] DOB: 1932-08-28 DOA: 12/16/2020  PCP: Dion Body, MD  Outpatient Specialists: Dr. Juleen China, nephrology Patient coming from: Home via EMS  I have personally briefly reviewed patient's old medical records in [place].  Chief Concern: [place]  HPI: Kyle Spence is a 87 y.o. male with medical history significant for hypertension, hyperlipidemia, history of gout, mild dementia, who presents emergency department for chief concerns of a fall.  At bedside patient is able to tell me his name, age, current location, current calendar year and identify his son at bedside.  Patient endorsed that for the last 2 days he has felt weak.  He states that after watching his Sunday morning show, while he was in bed, he put on his socks, shoes, he put his legs over the bed, got his walker next to his bed. He was walking toward the door with his walker, when he slipped on the laminated floor.   He denies feeling chest pain, shortness of breath, abdominal pain, headache, vision changes, loss of consciousness.  He states that his head may have hit the floor. He denies dysuria, hematuria, diarrhea.  Social history: He lives by himself and son lives about 2 miles away. They have web cameras in the house so they can monitor him. He never used tobacco, etoh, recreational drug use. He is retired and formerly was int Environmental education officer and worked for [employer].  Vaccination history: He has received 4 doses of Pfizer.   ROS: Constitutional: no weight change, no fever ENT/Mouth: no sore throat, no rhinorrhea Eyes: no eye pain, no vision changes Cardiovascular: no chest pain, no dyspnea,  no edema, no palpitations Respiratory: no cough, no sputum, no wheezing Gastrointestinal: no nausea, no vomiting, no diarrhea, no constipation Genitourinary: no urinary incontinence, no dysuria, no hematuria Musculoskeletal: no arthralgias, no myalgias Skin: no skin lesions, no  pruritus, Neuro: + weakness, no loss of consciousness, no syncope Psych: no anxiety, no depression, no decrease appetite Heme/Lymph: no bruising, no bleeding  ED Course: Discussed with emergency medicine provider, patient requiring hospitalization for chief concerns of left femoral neck fracture.  Vitals in the emergency department showed temperature of 98.8, respiration rate of 20, heart rate of 88, initial blood pressure 178/87, SPO2 of 97% on room air.  Labs in the emergency department was remarkable for serum sodium 134, potassium 4.8, chloride 100, bicarb 26, BUN of 39, serum creatinine of 1.77, nonfasting blood glucose 292, WBC 15.5, hemoglobin 14, platelets 217, GFR 37.  COVID/influenza A/influenza B PCR were pending.  In the emergency department patient was ordered and given morphine 4 mg IV, ondansetron 4 mg IV.Marland Kitchen  Assessment/Plan  Principal Problem:   Fracture of femoral neck, left (HCC) Active Problems:   Chronic constipation   Essential (primary) hypertension   Arthritis, degenerative   Peripheral nerve disease   H/O: gout   Peripheral artery disease (HCC)   Stage 3b chronic kidney disease (CKD) (HCC)   Leukocytosis   # Left femoral fracture secondary to mechanical fall - N.p.o. at midnight per Dr. Sabra Heck - Pain control: Morphine 1 mg IV every 2 hours as needed for pain, hydrocodone-acetaminophen 5-325 mg p.o. every 4 hours as needed for moderate pain - Admit to MedSurg, observation - Orthopedic service, Dr. Sabra Heck has been consulted and is aware of the patient - Orthopedic surgeon anticipates procedure in the afternoon to evening on 12/17/2020  # Leukocytosis-present on admission - Check procalcitonin and UA - Presumed secondary  to reactive  # Pyuria-poa; patient endorses suprapubic tenderness - Patient received 1 dose of cefazolin 1 g, clindamycin 600 mg IV on admission - Ceftriaxone 2 g IV ordered for 12/18/2020, total 2 doses ordered for complete 3 days  coverage - Order urine culture   # Weakness- vitamin B12 and vitamin D lab check ordered  # Hyperlipidemia-atorvastatin 40 mg nightly resumed  # Hyperglycemia with history of borderline diabetes mellitus - Insulin SSI with at bedtime coverage ordered - Check A1c  # History of hypertension-patient takes enalapril 20 mg daily, verapamil 240 mg daily  # DVT prophylaxis-I initially ordered 2 doses of heparin 5000 units subcutaneous every 8 hours due to short half life of heparin, however this was discontinued at the request of Dr. Hyacinth MeekerMiller - A.m. team to resume DVT prophylaxis when appropriate upon closed-loop communication with orthopedic team   Chart reviewed.   DVT prophylaxis: TED hose Code Status: Full code Diet: Heart healthy/carb modified Family Communication: Updated son, at bedside Disposition Plan: Pending clinical course Consults called: Orthopedic surgeon, Dr. 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 He has never used smokeless tobacco. He reports that he does not drink alcohol and does not use drugs.  Allergies  Allergen Reactions   Enalapril Other (See Comments)    HYPERKALEMIA    Penicillins Swelling   Family History  Problem Relation Age of Onset    Prostate cancer Neg Hx    Kidney cancer Neg Hx    Bladder Cancer Neg Hx    Family history: Family history reviewed and not pertinent  Prior to Admission medications   Medication Sig Start Date End Date Taking? Authorizing Provider  allopurinol (ZYLOPRIM) 100 MG tablet  01/20/15   [provider]  aspirin EC 81 MG tablet Take by mouth.    [provider]  atorvastatin (LIPITOR) 40 MG tablet  12/28/14   [provider]  docusate sodium (COLACE) 50 MG capsule Take by mouth.    [provider]  enalapril (VASOTEC) 20 MG tablet  12/28/14   [provider]  glucosamine-chondroitin 500-400 MG tablet Take 1 tablet by mouth 3 (three) times daily.    [provider]  Glycerin-Hypromellose-PEG 400 ([employer] DRY EYE RELIEF) 0.2-0.2-1 % SOLN Apply to eye.    [provider]  mirabegron ER (MYRBETRIQ) 25 MG TB24 tablet Take 1 tablet (25 mg total) by mouth daily. Patient taking differently: Take 50 mg by mouth daily.  03/26/15   Hildred LaserBudzyn, Brian Broc, MD  Multiple Vitamin (MULTI-VITAMINS) TABS Take by mouth.    [provider]  Nutritional Supplements (PROSTATE 2.4) CAPS Take by mouth.    [provider]  verapamil (VERELAN PM) 240 MG 24 hr capsule  01/17/15  [provider]   Physical Exam: Vitals:   12/16/20 1605 12/16/20 1935 12/16/20 2050 12/16/20 2230  BP: (!) 178/87 (!) 176/82  (!) 162/77  Pulse: 88 93  89  Resp: 20 19  20   Temp: 98.8 F (37.1 C)  98.7 F (37.1 C)   TempSrc: Oral  Oral   SpO2: 97% 97%  100%  Weight:      Height:       Constitutional: appears age-appropriate, NAD, calm, comfortable Eyes: PERRL, lids and conjunctivae normal ENMT: Mucous membranes are moist. Posterior pharynx clear of any exudate or lesions. Age-appropriate dentition.  Mild to moderate hearing loss Neck: normal, supple, no masses, no thyromegaly Respiratory: clear to auscultation bilaterally, no wheezing, no crackles.  Normal respiratory effort. No accessory muscle use.  Cardiovascular: Regular rate and rhythm, no murmurs / rubs / gallops. No extremity edema. 2+ pedal pulses. No carotid bruits.  Abdomen: Obese abdomen, no tenderness, no masses palpated, no hepatosplenomegaly. Bowel sounds positive.  Musculoskeletal: no clubbing / cyanosis. No joint deformity upper and lower extremities. Good ROM, no contractures, no atrophy. Normal muscle tone.  Skin: no rashes, lesions, ulcers. No induration Neurologic: Sensation intact. Strength 5/5 in all 4.  Psychiatric: Normal judgment and insight. Alert and oriented x 3. Normal mood.   EKG: independently reviewed, showing sinus rhythm with rate of 86, QTc 423  Chest x-ray on Admission: I personally reviewed and I agree with radiologist reading as below.  DG Chest 2 View  Result Date: 12/16/2020 CLINICAL DATA:  Fall, hip pain EXAM: CHEST - 2 VIEW COMPARISON:  06/14/2004 FINDINGS: The heart size and mediastinal contours are within normal limits. Coarsened interstitial markings bilaterally. No lobar consolidation. No pleural effusion or pneumothorax. The visualized skeletal structures are unremarkable. IMPRESSION: 1. No active cardiopulmonary disease. 2. Coarsened interstitial markings bilaterally, which may represent chronic bronchitic type lung changes. Electronically Signed   By: Davina Poke D.O.   On: 12/16/2020 17:45   DG Lumbar Spine 2-3 Views  Result Date: 12/16/2020 CLINICAL DATA:  [place], weakness for 2 days EXAM: LUMBAR SPINE - 2-3 VIEW COMPARISON:  None. FINDINGS: Frontal and lateral views of the lumbar spine demonstrate 5 non-rib-bearing lumbar type vertebral bodies. There is left convex scoliosis centered at the L4 level. There is grade 1 anterolisthesis of L5 on S1. No acute fractures. Prominent multilevel spondylosis and facet hypertrophy from L2-3 through L5-S1. Sacroiliac joints appear unremarkable. IMPRESSION: 1. Left convex scoliosis, with extensive  multilevel lumbar spondylosis and facet hypertrophy. 2. No acute fracture. Electronically Signed   By: Randa Ngo M.D.   On: 12/16/2020 17:41   CT Head Wo Contrast  Result Date: 12/16/2020 CLINICAL DATA:  Fall, head trauma EXAM: CT HEAD WITHOUT CONTRAST CT CERVICAL SPINE WITHOUT CONTRAST TECHNIQUE: Multidetector CT imaging of the head and cervical spine was performed following the standard protocol without intravenous contrast. Multiplanar CT image reconstructions of the cervical spine were also generated. COMPARISON:  08/16/2019 FINDINGS: CT HEAD FINDINGS Brain: No evidence of acute infarction, hemorrhage, hydrocephalus, extra-axial collection or mass lesion/mass effect. Periventricular and deep white matter hypodensity. Mild global cerebral volume loss. Vascular: No hyperdense vessel or unexpected calcification. Skull: Normal. Negative for fracture or focal lesion. Sinuses/Orbits: No acute finding. Other: None. CT CERVICAL SPINE FINDINGS Alignment: Degenerative straightening of the normal cervical lordosis. Skull base and vertebrae: No acute fracture. No primary bone lesion or focal pathologic process. Soft tissues and spinal canal: No prevertebral fluid or swelling. No visible canal hematoma. Disc levels: Moderate  to severe disc space height loss and osteophytosis from C4 through C7. Upper chest: Negative. Other: None. IMPRESSION: 1. No acute intracranial pathology. Small-vessel white matter disease and mild global cerebral volume loss. 2. No fracture or static subluxation of the cervical spine. 3. Moderate to severe disc space height loss and osteophytosis from C4 through C7. Electronically Signed   By: Jearld Lesch M.D.   On: 12/16/2020 17:01   CT Cervical Spine Wo Contrast  Result Date: 12/16/2020 CLINICAL DATA:  Fall, head trauma EXAM: CT HEAD WITHOUT CONTRAST CT CERVICAL SPINE WITHOUT CONTRAST TECHNIQUE: Multidetector CT imaging of the head and cervical spine was performed following the  standard protocol without intravenous contrast. Multiplanar CT image reconstructions of the cervical spine were also generated. COMPARISON:  08/16/2019 FINDINGS: CT HEAD FINDINGS Brain: No evidence of acute infarction, hemorrhage, hydrocephalus, extra-axial collection or mass lesion/mass effect. Periventricular and deep white matter hypodensity. Mild global cerebral volume loss. Vascular: No hyperdense vessel or unexpected calcification. Skull: Normal. Negative for fracture or focal lesion. Sinuses/Orbits: No acute finding. Other: None. CT CERVICAL SPINE FINDINGS Alignment: Degenerative straightening of the normal cervical lordosis. Skull base and vertebrae: No acute fracture. No primary bone lesion or focal pathologic process. Soft tissues and spinal canal: No prevertebral fluid or swelling. No visible canal hematoma. Disc levels: Moderate to severe disc space height loss and osteophytosis from C4 through C7. Upper chest: Negative. Other: None. IMPRESSION: 1. No acute intracranial pathology. Small-vessel white matter disease and mild global cerebral volume loss. 2. No fracture or static subluxation of the cervical spine. 3. Moderate to severe disc space height loss and osteophytosis from C4 through C7. Electronically Signed   By: Jearld Lesch M.D.   On: 12/16/2020 17:01   DG Hip Unilat W or Wo Pelvis 2-3 Views Left  Result Date: 12/16/2020 CLINICAL DATA:  Larey Seat, decreased movement EXAM: DG HIP (WITH OR WITHOUT PELVIS) 2-3V LEFT; DG HIP (WITH OR WITHOUT PELVIS) 2-3V RIGHT COMPARISON:  None. FINDINGS: Frontal view of the pelvis as well as frontal and frogleg lateral views of both hips are obtained. Left hip: There is a basicervical left femoral neck fracture, with mild impaction at the fracture site. No dislocation. Visualized portions of the left hemipelvis are unremarkable. Right hip: No fracture, subluxation, or dislocation. Joint spaces are well preserved. Visualized portions of the right hemipelvis are  unremarkable. IMPRESSION: 1. Basicervical left femoral neck fracture with mild impaction at the fracture site. 2. Unremarkable right hip. Electronically Signed   By: Sharlet Salina M.D.   On: 12/16/2020 17:42   DG HIP UNILAT WITH PELVIS 2-3 VIEWS RIGHT  Result Date: 12/16/2020 CLINICAL DATA:  Larey Seat, decreased movement EXAM: DG HIP (WITH OR WITHOUT PELVIS) 2-3V LEFT; DG HIP (WITH OR WITHOUT PELVIS) 2-3V RIGHT COMPARISON:  None. FINDINGS: Frontal view of the pelvis as well as frontal and frogleg lateral views of both hips are obtained. Left hip: There is a basicervical left femoral neck fracture, with mild impaction at the fracture site. No dislocation. Visualized portions of the left hemipelvis are unremarkable. Right hip: No fracture, subluxation, or dislocation. Joint spaces are well preserved. Visualized portions of the right hemipelvis are unremarkable. IMPRESSION: 1. Basicervical left femoral neck fracture with mild impaction at the fracture site. 2. Unremarkable right hip. Electronically Signed   By: Sharlet Salina M.D.   On: 12/16/2020 17:42    Labs on Admission: I have personally reviewed following labs  CBC: Recent Labs  Lab 12/16/20 1606  WBC 15.5*  HGB 14.0  HCT 42.4  MCV 97.2  PLT [id number]   Basic Metabolic Panel: Recent Labs  Lab 12/16/20 1606  NA 134*  K 4.8  CL 100  CO2 26  GLUCOSE 292*  BUN 39*  CREATININE 1.77*  CALCIUM 9.4   GFR: Estimated Creatinine Clearance: 34.2 mL/min (A) (by C-G formula based on SCr of 1.77 mg/dL (H)).  Cardiac Enzymes: Recent Labs  Lab 12/16/20 1606  CKTOTAL 136   Urine analysis:    Component Value Date/Time   APPEARANCEUR Clear 03/09/2015 1333   GLUCOSEU Negative 03/09/2015 1333   BILIRUBINUR Negative 03/09/2015 1333   PROTEINUR Negative 03/09/2015 1333   NITRITE Negative 03/09/2015 1333   LEUKOCYTESUR Negative 03/09/2015 1333   Dr. Tobie Poet [facility]  If 7PM-7AM, please contact overnight-coverage provider If 7AM-7PM,  please contact day coverage provider [email or web address]  12/16/2020, 10:55 PM

## 2020-12-16 NOTE — ED Provider Notes (Signed)
Emergency Medicine Provider Triage Evaluation Note  Kyle Spence , a 87 y.o. male  was evaluated in triage.  Pt complains of weakness, left hip pain.  Does have dementia, somewhat of a poor historian and majority of the history is provided by the son.  According to the son he noticed that the patient had fallen and was laying on the floor as he has cameras inside the patient's house.  He traveled to the patient was approximately an hour to get to the patient after observing this on the camera.  Patient does not moving the left hip or leg at this time.  Unsure of any symptoms preceding the fall.  Review of Systems  Positive: Fall, left hip pain, weakness Negative: Recent illnesses, reported fevers, chills.  Patient denied chest pain or abdominal complaints  Physical Exam  There were no vitals taken for this visit. Gen:   Awake, no distress   Resp:  Normal effort  MSK:   Limited motion to the left lower extremity Other:    Medical Decision Making  Medically screening exam initiated at 4:01 PM.  Appropriate orders placed.  [name] was informed that the remainder of the evaluation will be completed by another provider, this initial triage assessment does not replace that evaluation, and the importance of remaining in the ED until their evaluation is complete.  Patient presents with his son after falling today.  Patient is typically ambulatory cannot move his left lower extremity at this time.  Was on the floor for at least an hour, possibly longer.  Patient will have labs, imaging, urine at this time   Lanette Hampshire 12/16/20 1607    Chesley Noon, MD 12/17/20 0006

## 2020-12-16 NOTE — ED Triage Notes (Signed)
Pt son reports pt fell this am and stayed on the floor about 45 minutes. Pt son reports saw him on camera and went over to help. Pt son reports as he was helping him up he noticed that he was weaker than usual. Pt at baseline can stand on his own but has not been able to today.

## 2020-12-17 ENCOUNTER — Inpatient Hospital Stay: Payer: Medicare Other | Admitting: Anesthesiology

## 2020-12-17 ENCOUNTER — Inpatient Hospital Stay: Payer: Medicare Other

## 2020-12-17 ENCOUNTER — Encounter
Admission: EM | Disposition: A | Discharge status: Skilled Nursing Facility | Payer: Self-pay | Source: Home / Self Care | Attending: Internal Medicine

## 2020-12-17 ENCOUNTER — Encounter: Payer: Self-pay | Admitting: Internal Medicine

## 2020-12-17 DIAGNOSIS — N3000 Acute cystitis without hematuria: Secondary | ICD-10-CM | POA: Diagnosis not present

## 2020-12-17 DIAGNOSIS — W010XXA Fall on same level from slipping, tripping and stumbling without subsequent striking against object, initial encounter: Secondary | ICD-10-CM | POA: Diagnosis present

## 2020-12-17 DIAGNOSIS — M25552 Pain in left hip: Secondary | ICD-10-CM | POA: Diagnosis present

## 2020-12-17 DIAGNOSIS — E8770 Fluid overload, unspecified: Secondary | ICD-10-CM | POA: Diagnosis not present

## 2020-12-17 DIAGNOSIS — S72009A Fracture of unspecified part of neck of unspecified femur, initial encounter for closed fracture: Secondary | ICD-10-CM | POA: Diagnosis present

## 2020-12-17 DIAGNOSIS — Z01818 Encounter for other preprocedural examination: Secondary | ICD-10-CM | POA: Diagnosis not present

## 2020-12-17 DIAGNOSIS — I739 Peripheral vascular disease, unspecified: Secondary | ICD-10-CM | POA: Diagnosis present

## 2020-12-17 DIAGNOSIS — R739 Hyperglycemia, unspecified: Secondary | ICD-10-CM | POA: Diagnosis present

## 2020-12-17 DIAGNOSIS — E785 Hyperlipidemia, unspecified: Secondary | ICD-10-CM | POA: Diagnosis present

## 2020-12-17 DIAGNOSIS — I10 Essential (primary) hypertension: Secondary | ICD-10-CM | POA: Diagnosis not present

## 2020-12-17 DIAGNOSIS — N1832 Chronic kidney disease, stage 3b: Secondary | ICD-10-CM | POA: Diagnosis present

## 2020-12-17 DIAGNOSIS — N390 Urinary tract infection, site not specified: Secondary | ICD-10-CM | POA: Diagnosis not present

## 2020-12-17 DIAGNOSIS — Z20822 Contact with and (suspected) exposure to covid-19: Secondary | ICD-10-CM | POA: Diagnosis present

## 2020-12-17 DIAGNOSIS — S72002S Fracture of unspecified part of neck of left femur, sequela: Secondary | ICD-10-CM

## 2020-12-17 DIAGNOSIS — F03A0 Unspecified dementia, mild, without behavioral disturbance, psychotic disturbance, mood disturbance, and anxiety: Secondary | ICD-10-CM | POA: Diagnosis present

## 2020-12-17 DIAGNOSIS — Z7982 Long term (current) use of aspirin: Secondary | ICD-10-CM | POA: Diagnosis not present

## 2020-12-17 DIAGNOSIS — I129 Hypertensive chronic kidney disease with stage 1 through stage 4 chronic kidney disease, or unspecified chronic kidney disease: Secondary | ICD-10-CM | POA: Diagnosis present

## 2020-12-17 DIAGNOSIS — K5909 Other constipation: Secondary | ICD-10-CM | POA: Diagnosis present

## 2020-12-17 DIAGNOSIS — R7301 Impaired fasting glucose: Secondary | ICD-10-CM

## 2020-12-17 DIAGNOSIS — M109 Gout, unspecified: Secondary | ICD-10-CM | POA: Diagnosis present

## 2020-12-17 DIAGNOSIS — N400 Enlarged prostate without lower urinary tract symptoms: Secondary | ICD-10-CM | POA: Diagnosis present

## 2020-12-17 DIAGNOSIS — N189 Chronic kidney disease, unspecified: Secondary | ICD-10-CM

## 2020-12-17 DIAGNOSIS — Y92012 Bathroom of single-family (private) house as the place of occurrence of the external cause: Secondary | ICD-10-CM | POA: Diagnosis not present

## 2020-12-17 DIAGNOSIS — N179 Acute kidney failure, unspecified: Secondary | ICD-10-CM | POA: Diagnosis not present

## 2020-12-17 DIAGNOSIS — Z79899 Other long term (current) drug therapy: Secondary | ICD-10-CM | POA: Diagnosis not present

## 2020-12-17 DIAGNOSIS — S72002A Fracture of unspecified part of neck of left femur, initial encounter for closed fracture: Secondary | ICD-10-CM | POA: Diagnosis not present

## 2020-12-17 DIAGNOSIS — S72012A Unspecified intracapsular fracture of left femur, initial encounter for closed fracture: Secondary | ICD-10-CM | POA: Diagnosis present

## 2020-12-17 HISTORY — PX: HIP ARTHROPLASTY: SHX981

## 2020-12-17 LAB — BASIC METABOLIC PANEL
Anion gap: 8 (ref 5–15)
BUN: 28 mg/dL — ABNORMAL HIGH (ref 8–23)
CO2: 28 mmol/L (ref 22–32)
Calcium: 9.1 mg/dL (ref 8.9–10.3)
Chloride: 102 mmol/L (ref 98–111)
Creatinine, Ser: 1.46 mg/dL — ABNORMAL HIGH (ref 0.61–1.24)
GFR, Estimated: 47 mL/min — ABNORMAL LOW (ref >60)
Glucose, Bld: 139 mg/dL — ABNORMAL HIGH (ref 70–99)
Potassium: 4.5 mmol/L (ref 3.5–5.1)
Sodium: 138 mmol/L (ref 135–145)

## 2020-12-17 LAB — VITAMIN B12: Vitamin B-12: 787 pg/mL (ref 180–914)

## 2020-12-17 LAB — CBC
HCT: 40.2 % (ref 39.0–52.0)
Hemoglobin: 13.3 g/dL (ref 13.0–17.0)
MCH: 31.5 pg (ref 26.0–34.0)
MCHC: 33.1 g/dL (ref 30.0–36.0)
MCV: 95.3 fL (ref 80.0–100.0)
Platelets: 189 10*3/uL (ref 150–400)
RBC: 4.22 MIL/uL (ref 4.22–5.81)
RDW: 13.7 % (ref 11.5–15.5)
WBC: 10.9 10*3/uL — ABNORMAL HIGH (ref 4.0–10.5)
nRBC: 0 % (ref 0.0–0.2)

## 2020-12-17 LAB — GLUCOSE, CAPILLARY
Glucose-Capillary: 123 mg/dL — ABNORMAL HIGH (ref 70–99)
Glucose-Capillary: 119 mg/dL — ABNORMAL HIGH (ref 70–99)
Glucose-Capillary: 109 mg/dL — ABNORMAL HIGH (ref 70–99)
Glucose-Capillary: 96 mg/dL (ref 70–99)
Glucose-Capillary: 159 mg/dL — ABNORMAL HIGH (ref 70–99)

## 2020-12-17 LAB — VITAMIN D 25 HYDROXY (VIT D DEFICIENCY, FRACTURES): Vit D, 25-Hydroxy: 34.3 ng/mL (ref 30–100)

## 2020-12-17 LAB — PROCALCITONIN: Procalcitonin: 0.16 ng/mL

## 2020-12-17 LAB — HEMOGLOBIN A1C
Hgb A1c MFr Bld: 5.6 % (ref 4.8–5.6)
Mean Plasma Glucose: 114.02 mg/dL

## 2020-12-17 LAB — CBG MONITORING, ED: Glucose-Capillary: 127 mg/dL — ABNORMAL HIGH (ref 70–99)

## 2020-12-17 SURGERY — HEMIARTHROPLASTY, HIP, DIRECT ANTERIOR APPROACH, FOR FRACTURE
Anesthesia: General | Site: Hip | Laterality: Left

## 2020-12-17 MED ORDER — SODIUM CHLORIDE 0.9 % IV SOLN: 1.0000 g | INTRAVENOUS | Status: DC

## 2020-12-17 MED ORDER — PHENYLEPHRINE HCL (PRESSORS) 10 MG/ML IV SOLN
INTRAVENOUS | Status: DC | PRN
  Administered 2020-12-17 (×2): 240 ug via INTRAVENOUS
  Administered 2020-12-17: 160 ug via INTRAVENOUS

## 2020-12-17 MED ORDER — METHOCARBAMOL 500 MG PO TABS
500.0000 mg | ORAL_TABLET | Freq: Four times a day (QID) | ORAL | Status: DC | PRN
  Administered 2020-12-18 – 2020-12-19 (×2): 500 mg via ORAL
  Filled 2020-12-17 (×2): qty 1

## 2020-12-17 MED ORDER — ALBUMIN HUMAN 5 % IV SOLN: INTRAVENOUS | Status: DC | PRN

## 2020-12-17 MED ORDER — BISACODYL 10 MG RE SUPP: 10.0000 mg | Freq: Every day | RECTAL | Status: DC | PRN

## 2020-12-17 MED ORDER — EPHEDRINE SULFATE 50 MG/ML IJ SOLN
INTRAMUSCULAR | Status: DC | PRN
  Administered 2020-12-17: 5 mg via INTRAVENOUS
  Administered 2020-12-17 (×2): 10 mg via INTRAVENOUS

## 2020-12-17 MED ORDER — OXYCODONE HCL 5 MG/5ML PO SOLN: 5.0000 mg | Freq: Once | ORAL | Status: DC | PRN

## 2020-12-17 MED ORDER — ENOXAPARIN SODIUM 30 MG/0.3ML IJ SOSY
30.0000 mg | PREFILLED_SYRINGE | INTRAMUSCULAR | Status: DC
  Administered 2020-12-18 – 2020-12-19 (×2): 30 mg via SUBCUTANEOUS
  Filled 2020-12-17 (×2): qty 0.3

## 2020-12-17 MED ORDER — CELECOXIB 200 MG PO CAPS
200.0000 mg | ORAL_CAPSULE | Freq: Two times a day (BID) | ORAL | Status: DC
  Administered 2020-12-17 – 2020-12-19 (×4): 200 mg via ORAL
  Filled 2020-12-17 (×4): qty 1

## 2020-12-17 MED ORDER — ALBUMIN HUMAN 5 % IV SOLN
INTRAVENOUS | Status: AC
  Filled 2020-12-17: qty 250

## 2020-12-17 MED ORDER — CLINDAMYCIN PHOSPHATE 600 MG/50ML IV SOLN
INTRAVENOUS | Status: AC
  Administered 2020-12-17: 600 mg via INTRAVENOUS
  Filled 2020-12-17: qty 50

## 2020-12-17 MED ORDER — BUPIVACAINE HCL (PF) 0.5 % IJ SOLN
INTRAMUSCULAR | Status: AC
  Filled 2020-12-17: qty 20

## 2020-12-17 MED ORDER — ALUM & MAG HYDROXIDE-SIMETH 200-200-20 MG/5ML PO SUSP: 30.0000 mL | ORAL | Status: DC | PRN

## 2020-12-17 MED ORDER — BUPIVACAINE-EPINEPHRINE (PF) 0.5% -1:200000 IJ SOLN
INTRAMUSCULAR | Status: AC
  Filled 2020-12-17: qty 30

## 2020-12-17 MED ORDER — ENALAPRIL MALEATE 20 MG PO TABS: 20.0000 mg | ORAL_TABLET | Freq: Every day | ORAL | Status: DC

## 2020-12-17 MED ORDER — VASOPRESSIN 20 UNIT/ML IV SOLN
INTRAVENOUS | Status: DC | PRN
  Administered 2020-12-17 (×4): 1 [IU] via INTRAVENOUS

## 2020-12-17 MED ORDER — BUPIVACAINE-EPINEPHRINE (PF) 0.25% -1:200000 IJ SOLN
INTRAMUSCULAR | Status: AC
  Filled 2020-12-17: qty 30

## 2020-12-17 MED ORDER — ACETAMINOPHEN 10 MG/ML IV SOLN: 1000.0000 mg | Freq: Once | INTRAVENOUS | Status: DC | PRN

## 2020-12-17 MED ORDER — SODIUM CHLORIDE FLUSH 0.9 % IV SOLN
INTRAVENOUS | Status: AC
  Filled 2020-12-17: qty 40

## 2020-12-17 MED ORDER — SODIUM CHLORIDE FLUSH 0.9 % IV SOLN
INTRAVENOUS | Status: AC
  Filled 2020-12-17: qty 10

## 2020-12-17 MED ORDER — FLEET ENEMA 7-19 GM/118ML RE ENEM: 1.0000 | ENEMA | Freq: Once | RECTAL | Status: DC | PRN

## 2020-12-17 MED ORDER — TRANEXAMIC ACID-NACL 1000-0.7 MG/100ML-% IV SOLN: 1000.0000 mg | Freq: Once | INTRAVENOUS | Status: AC

## 2020-12-17 MED ORDER — BUPIVACAINE HCL (PF) 0.5 % IJ SOLN
INTRAMUSCULAR | Status: DC | PRN
  Administered 2020-12-17: 2.6 mL

## 2020-12-17 MED ORDER — CEFAZOLIN SODIUM-DEXTROSE 1-4 GM/50ML-% IV SOLN
INTRAVENOUS | Status: AC
  Filled 2020-12-17: qty 50

## 2020-12-17 MED ORDER — PHENYLEPHRINE HCL-NACL 20-0.9 MG/250ML-% IV SOLN
INTRAVENOUS | Status: DC | PRN
  Administered 2020-12-17: 50 ug/min via INTRAVENOUS

## 2020-12-17 MED ORDER — FENTANYL CITRATE (PF) 100 MCG/2ML IJ SOLN: 25.0000 ug | INTRAMUSCULAR | Status: DC | PRN

## 2020-12-17 MED ORDER — SODIUM CHLORIDE 0.45 % IV SOLN: INTRAVENOUS | Status: DC

## 2020-12-17 MED ORDER — ONDANSETRON HCL 4 MG/2ML IJ SOLN: 4.0000 mg | Freq: Once | INTRAMUSCULAR | Status: DC | PRN

## 2020-12-17 MED ORDER — BUPIVACAINE-EPINEPHRINE (PF) 0.25% -1:200000 IJ SOLN
INTRAMUSCULAR | Status: DC | PRN
  Administered 2020-12-17: 30 mL

## 2020-12-17 MED ORDER — GLUCERNA SHAKE PO LIQD
237.0000 mL | Freq: Two times a day (BID) | ORAL | Status: DC
  Administered 2020-12-18 – 2020-12-19 (×3): 237 mL via ORAL
  Filled 2020-12-17 (×4): qty 237

## 2020-12-17 MED ORDER — SODIUM CHLORIDE 0.9 % IV SOLN
2.0000 g | INTRAVENOUS | Status: AC
  Administered 2020-12-18 – 2020-12-19 (×2): 2 g via INTRAVENOUS
  Filled 2020-12-17 (×2): qty 20

## 2020-12-17 MED ORDER — SODIUM CHLORIDE 0.9 % IR SOLN
Status: DC | PRN
  Administered 2020-12-17: 1000 mL

## 2020-12-17 MED ORDER — MENTHOL 3 MG MT LOZG
1.0000 | LOZENGE | OROMUCOSAL | Status: DC | PRN
  Filled 2020-12-17: qty 9

## 2020-12-17 MED ORDER — BUPIVACAINE LIPOSOME 1.3 % IJ SUSP
INTRAMUSCULAR | Status: AC
  Filled 2020-12-17: qty 20

## 2020-12-17 MED ORDER — ZOLPIDEM TARTRATE 5 MG PO TABS: 5.0000 mg | ORAL_TABLET | Freq: Every evening | ORAL | Status: DC | PRN

## 2020-12-17 MED ORDER — PHENOL 1.4 % MT LIQD
1.0000 | OROMUCOSAL | Status: DC | PRN
  Filled 2020-12-17: qty 177

## 2020-12-17 MED ORDER — METOCLOPRAMIDE HCL 5 MG/ML IJ SOLN: 5.0000 mg | Freq: Three times a day (TID) | INTRAMUSCULAR | Status: DC | PRN

## 2020-12-17 MED ORDER — METOCLOPRAMIDE HCL 10 MG PO TABS: 5.0000 mg | ORAL_TABLET | Freq: Three times a day (TID) | ORAL | Status: DC | PRN

## 2020-12-17 MED ORDER — ACETAMINOPHEN 325 MG PO TABS: 325.0000 mg | ORAL_TABLET | Freq: Four times a day (QID) | ORAL | Status: DC | PRN

## 2020-12-17 MED ORDER — ALLOPURINOL 100 MG PO TABS: 100.0000 mg | ORAL_TABLET | Freq: Every day | ORAL | Status: DC

## 2020-12-17 MED ORDER — VASOPRESSIN 20 UNIT/ML IV SOLN
INTRAVENOUS | Status: AC
  Filled 2020-12-17: qty 1

## 2020-12-17 MED ORDER — TAMSULOSIN HCL 0.4 MG PO CAPS
0.4000 mg | ORAL_CAPSULE | Freq: Every day | ORAL | Status: DC
  Administered 2020-12-18 – 2020-12-19 (×2): 0.4 mg via ORAL
  Filled 2020-12-17: qty 1

## 2020-12-17 MED ORDER — PROPOFOL 1000 MG/100ML IV EMUL
INTRAVENOUS | Status: AC
  Filled 2020-12-17: qty 100

## 2020-12-17 MED ORDER — CLINDAMYCIN PHOSPHATE 600 MG/50ML IV SOLN
600.0000 mg | Freq: Three times a day (TID) | INTRAVENOUS | Status: AC
  Administered 2020-12-18 (×2): 600 mg via INTRAVENOUS
  Filled 2020-12-17 (×3): qty 50

## 2020-12-17 MED ORDER — PHENYLEPHRINE HCL-NACL 20-0.9 MG/250ML-% IV SOLN
INTRAVENOUS | Status: AC
  Filled 2020-12-17: qty 250

## 2020-12-17 MED ORDER — SODIUM CHLORIDE 0.9 % IV SOLN
Status: DC | PRN
  Administered 2020-12-17: 1000 mL

## 2020-12-17 MED ORDER — KETAMINE HCL 50 MG/ML IJ SOLN
INTRAMUSCULAR | Status: DC | PRN
  Administered 2020-12-17: 25 mg via INTRAMUSCULAR

## 2020-12-17 MED ORDER — MULTI-VITAMINS PO TABS: 1.0000 | ORAL_TABLET | Freq: Every day | ORAL | Status: DC

## 2020-12-17 MED ORDER — DOCUSATE SODIUM 100 MG PO CAPS
100.0000 mg | ORAL_CAPSULE | Freq: Two times a day (BID) | ORAL | Status: DC
  Administered 2020-12-17 – 2020-12-19 (×4): 100 mg via ORAL
  Filled 2020-12-17 (×4): qty 1

## 2020-12-17 MED ORDER — FENTANYL CITRATE (PF) 100 MCG/2ML IJ SOLN
INTRAMUSCULAR | Status: AC
  Filled 2020-12-17: qty 2

## 2020-12-17 MED ORDER — GLUCOSAMINE-CHONDROITIN 500-400 MG PO TABS: 1.0000 | ORAL_TABLET | Freq: Three times a day (TID) | ORAL | Status: DC

## 2020-12-17 MED ORDER — LACTATED RINGERS IV SOLN: INTRAVENOUS | Status: DC

## 2020-12-17 MED ORDER — VERAPAMIL HCL ER 240 MG PO TBCR
240.0000 mg | EXTENDED_RELEASE_TABLET | Freq: Every day | ORAL | Status: DC
  Administered 2020-12-18 – 2020-12-19 (×2): 240 mg via ORAL
  Filled 2020-12-17 (×3): qty 1

## 2020-12-17 MED ORDER — FENTANYL CITRATE (PF) 100 MCG/2ML IJ SOLN
INTRAMUSCULAR | Status: DC | PRN
  Administered 2020-12-17: 25 ug via INTRAVENOUS

## 2020-12-17 MED ORDER — PROPOFOL 500 MG/50ML IV EMUL
INTRAVENOUS | Status: DC | PRN
  Administered 2020-12-17: 50 ug/kg/min via INTRAVENOUS

## 2020-12-17 MED ORDER — NEOMYCIN-POLYMYXIN B GU 40-200000 IR SOLN
Status: AC
  Filled 2020-12-17: qty 1

## 2020-12-17 MED ORDER — CEFAZOLIN SODIUM-DEXTROSE 2-4 GM/100ML-% IV SOLN
2.0000 g | Freq: Three times a day (TID) | INTRAVENOUS | Status: AC
  Administered 2020-12-17 – 2020-12-18 (×2): 2 g via INTRAVENOUS
  Filled 2020-12-17 (×3): qty 100

## 2020-12-17 MED ORDER — ADULT MULTIVITAMIN W/MINERALS CH
1.0000 | ORAL_TABLET | Freq: Every day | ORAL | Status: DC
Start: 2020-12-18 — End: 2020-12-19
  Administered 2020-12-18 – 2020-12-19 (×2): 1 via ORAL
  Filled 2020-12-17 (×2): qty 1

## 2020-12-17 MED ORDER — TRANEXAMIC ACID 1000 MG/10ML IV SOLN
INTRAVENOUS | Status: AC
  Filled 2020-12-17: qty 10

## 2020-12-17 MED ORDER — OXYCODONE HCL 5 MG PO TABS: 5.0000 mg | ORAL_TABLET | Freq: Once | ORAL | Status: DC | PRN

## 2020-12-17 MED ORDER — ENSURE MAX PROTEIN PO LIQD
11.0000 [oz_av] | Freq: Every day | ORAL | Status: DC
  Administered 2020-12-17: 11 [oz_av] via ORAL

## 2020-12-17 MED ORDER — ALLOPURINOL 100 MG PO TABS
100.0000 mg | ORAL_TABLET | Freq: Every day | ORAL | Status: DC
  Administered 2020-12-18 – 2020-12-19 (×2): 100 mg via ORAL
  Filled 2020-12-17 (×3): qty 1

## 2020-12-17 MED ORDER — ASPIRIN EC 81 MG PO TBEC
81.0000 mg | DELAYED_RELEASE_TABLET | Freq: Every day | ORAL | Status: DC
  Administered 2020-12-18 – 2020-12-19 (×2): 81 mg via ORAL
  Filled 2020-12-17 (×2): qty 1

## 2020-12-17 MED ORDER — MIRABEGRON ER 25 MG PO TB24
25.0000 mg | ORAL_TABLET | Freq: Every day | ORAL | Status: DC
  Administered 2020-12-18 – 2020-12-19 (×2): 25 mg via ORAL
  Filled 2020-12-17 (×2): qty 1

## 2020-12-17 MED ORDER — FERROUS SULFATE 325 (65 FE) MG PO TABS
325.0000 mg | ORAL_TABLET | Freq: Every day | ORAL | Status: DC
  Administered 2020-12-18 – 2020-12-19 (×2): 325 mg via ORAL
  Filled 2020-12-17 (×2): qty 1

## 2020-12-17 MED ORDER — METHOCARBAMOL 1000 MG/10ML IJ SOLN
500.0000 mg | Freq: Four times a day (QID) | INTRAVENOUS | Status: DC | PRN
  Filled 2020-12-17: qty 5

## 2020-12-17 MED ORDER — EPHEDRINE 5 MG/ML INJ
INTRAVENOUS | Status: AC
  Filled 2020-12-17: qty 5

## 2020-12-17 MED ORDER — TRANEXAMIC ACID-NACL 1000-0.7 MG/100ML-% IV SOLN
INTRAVENOUS | Status: AC
  Administered 2020-12-17: 1000 mg via INTRAVENOUS
  Filled 2020-12-17: qty 100

## 2020-12-17 SURGICAL SUPPLY — 51 items
BLADE SAGITTAL WIDE XTHICK NO (BLADE) ×2 IMPLANT
CHLORAPREP W/TINT 26 (MISCELLANEOUS) ×4 IMPLANT
COVER BACK TABLE REUSABLE LG (DRAPES) ×2 IMPLANT
DRAPE INCISE IOBAN 66X60 STRL (DRAPES) ×4 IMPLANT
DRSG AQUACEL AG ADV 3.5X10 (GAUZE/BANDAGES/DRESSINGS) ×1 IMPLANT
DRSG AQUACEL AG ADV 3.5X14 (GAUZE/BANDAGES/DRESSINGS) ×1 IMPLANT
ELECT CAUTERY BLADE 6.4 (BLADE) ×2 IMPLANT
ELECT REM PT RETURN 9FT ADLT (ELECTROSURGICAL) ×2
ELECTRODE REM PT RTRN 9FT ADLT (ELECTROSURGICAL) ×1 IMPLANT
GAUZE 4X4 16PLY ~~LOC~~+RFID DBL (SPONGE) ×2 IMPLANT
GAUZE SPONGE 4X4 12PLY STRL (GAUZE/BANDAGES/DRESSINGS) ×3 IMPLANT
GAUZE XEROFORM 1X8 LF (GAUZE/BANDAGES/DRESSINGS) ×3 IMPLANT
GLOVE SURG ORTHO LTX SZ8.5 (GLOVE) ×2 IMPLANT
GLOVE SURG UNDER LTX SZ8 (GLOVE) ×2 IMPLANT
GOWN STRL REUS W/ TWL LRG LVL3 (GOWN DISPOSABLE) ×2 IMPLANT
GOWN STRL REUS W/TWL LRG LVL3 (GOWN DISPOSABLE) ×2
GOWN STRL REUS W/TWL LRG LVL4 (GOWN DISPOSABLE) ×2 IMPLANT
HEAD MODULAR ENDO (Orthopedic Implant) ×1 IMPLANT
HEAD UNPLR 53XMDLR STRL HIP (Orthopedic Implant) IMPLANT
HEMOVAC 400CC 10FR (MISCELLANEOUS) ×2 IMPLANT
IV NS 1000ML (IV SOLUTION) ×1
IV NS 1000ML BAXH (IV SOLUTION) ×1 IMPLANT
KIT TURNOVER KIT A (KITS) ×2 IMPLANT
MANIFOLD NEPTUNE II (INSTRUMENTS) ×2 IMPLANT
NDL FILTER BLUNT 18X1 1/2 (NEEDLE) ×1 IMPLANT
NDL SPNL 18GX3.5 QUINCKE PK (NEEDLE) ×2 IMPLANT
NEEDLE FILTER BLUNT 18X 1/2SAF (NEEDLE) ×1
NEEDLE FILTER BLUNT 18X1 1/2 (NEEDLE) ×1 IMPLANT
NEEDLE SPNL 18GX3.5 QUINCKE PK (NEEDLE) ×4 IMPLANT
NS IRRIG 1000ML POUR BTL (IV SOLUTION) ×2 IMPLANT
PACK HIP PROSTHESIS (MISCELLANEOUS) ×2 IMPLANT
PAD ABD DERMACEA PRESS 5X9 (GAUZE/BANDAGES/DRESSINGS) ×2 IMPLANT
PULSAVAC PLUS IRRIG FAN TIP (DISPOSABLE) ×2
SLEEVE UNITRAX V40 STD (Orthopedic Implant) ×1 IMPLANT
SOL PREP PVP 2OZ (MISCELLANEOUS) ×2
SOLUTION PREP PVP 2OZ (MISCELLANEOUS) ×1 IMPLANT
SPONGE T-LAP 18X18 ~~LOC~~+RFID (SPONGE) ×8 IMPLANT
STAPLER SKIN PROX 35W (STAPLE) ×2 IMPLANT
STEM HIP 127 DEG (Stem) ×1 IMPLANT
SUT DVC 2 QUILL PDO  T11 36X36 (SUTURE) ×2
SUT DVC 2 QUILL PDO T11 36X36 (SUTURE) ×2 IMPLANT
SUT QUILL PDO 0 36 36 VIOLET (SUTURE) ×2 IMPLANT
SUT TICRON 2-0 30IN 311381 (SUTURE) ×6 IMPLANT
SYR 10ML LL (SYRINGE) ×2 IMPLANT
SYR 30ML LL (SYRINGE) ×2 IMPLANT
SYR 50ML LL SCALE MARK (SYRINGE) ×2 IMPLANT
TAPE MICROFOAM 4IN (TAPE) ×2 IMPLANT
TIP FAN IRRIG PULSAVAC PLUS (DISPOSABLE) ×1 IMPLANT
TUBE SUCT KAM VAC (TUBING) ×2 IMPLANT
WATER STERILE IRR 1000ML POUR (IV SOLUTION) ×2 IMPLANT
WATER STERILE IRR 500ML POUR (IV SOLUTION) ×2 IMPLANT

## 2020-12-17 NOTE — TOC Progression Note (Signed)
Transition of Care [facility]) - Progression Note    Patient Details  Name: Kyle Spence MRN: [id number] Date of Birth: Apr 22, 1932  [facility] CM/SW Contact  Marlowe Sax, RN Phone Number: 12/17/2020, 10:07 AM  Clinical Narrative:     Patient from home he fell at home and his son came to help after seeing on camera.  The patient is to have surgery today. PT to eval after, TOC to follow and assist with DC plan       Expected Discharge Plan and Services                                                 Social Determinants of Health (SDOH) Interventions    Readmission Risk Interventions No flowsheet data found.

## 2020-12-17 NOTE — Consult Note (Signed)
ORTHOPAEDIC CONSULTATION  REQUESTING PHYSICIAN: Alford Highland, MD  Chief Complaint: Left hip pain  HPI: Kyle Spence is a 87 y.o. male who complains of left hip pain after a fall last evening.  Patient was using his walker to go to the bathroom when he lost his balance and fell.  He was brought to the emergency room where exam and x-rays revealed a displaced subcapital fracture of the left hip.  He has been admitted for surgical fixation of the fracture and medical stabilization.  I have discussed treatment with the patient I recommended a hemiarthroplasty since the fracture is displaced.  He is agreeable to this.  Risks and benefits and postop protocol were discussed with him.  I will speak with his family when they arrive later this afternoon.  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   Edema leg 02/07/2015   Overview:  chronic    Essential (primary) hypertension 02/07/2015   H/O: gout 04/26/2014   Peripheral nerve disease 02/07/2015   Past Surgical History:  Procedure Laterality Date   GREEN LIGHT LASER TURP (TRANSURETHRAL RESECTION OF PROSTATE     HERNIA REPAIR     STOMACH SURGERY     child   TONSILLECTOMY     Social History   Socioeconomic History   Marital status: Widowed    Spouse name: Not on file   Number of children: Not on file   Years of education: Not on file   Highest education level: Not on file  Occupational History   Not on file  Tobacco Use   Smoking status: Never   Smokeless tobacco: Never  Vaping Use   Vaping Use: Never used  Substance and Sexual Activity   Alcohol use: No    Alcohol/week: 0.0 standard drinks   Drug use: No   Sexual activity: Not Currently  Other Topics Concern    Not on file  Social History Narrative   Not on file   Social Determinants of Health   Financial Resource Strain: Not on file  Food Insecurity: Not on file  Transportation Needs: Not on file  Physical Activity: Not on file  Stress: Not on file  Social Connections: Not on file   Family History  Problem Relation Age of Onset   Prostate cancer Neg Hx    Kidney cancer Neg Hx    Bladder Cancer Neg Hx    Allergies  Allergen Reactions   Enalapril Other (See Comments)    HYPERKALEMIA    Penicillins Swelling   Prior to Admission medications   Medication Sig Start Date End Date Taking? Authorizing Provider  allopurinol (ZYLOPRIM) 100 MG tablet  01/20/15   [provider]  aspirin EC 81 MG tablet Take by mouth.    [provider]  atorvastatin (LIPITOR) 40 MG tablet  12/28/14   [provider]  docusate sodium (COLACE) 50 MG capsule Take by mouth.    [provider]  enalapril (VASOTEC) 20 MG tablet  12/28/14   [provider]  glucosamine-chondroitin 500-400 MG tablet Take 1 tablet by mouth 3 (three) times daily.    [provider]  Glycerin-Hypromellose-PEG 400 ([facility] DRY EYE RELIEF) 0.2-0.2-1 % SOLN Apply to eye.    [provider]  mirabegron ER (MYRBETRIQ) 25  MG TB24 tablet Take 1 tablet (25 mg total) by mouth daily. Patient taking differently: Take 50 mg by mouth daily.  03/26/15   Nickie Retort, MD  Multiple Vitamin (MULTI-VITAMINS) TABS Take by mouth.    [provider]  Nutritional Supplements (PROSTATE 2.4) CAPS Take by mouth.    [provider]  verapamil (VERELAN PM) 240 MG 24 hr capsule 240 mg daily. 01/17/15   [provider]   DG Chest 2 View  Result Date: 12/16/2020 CLINICAL DATA:  Fall, hip pain EXAM: CHEST - 2 VIEW COMPARISON:  06/14/2004 FINDINGS: The heart size and mediastinal contours are within normal limits. Coarsened interstitial markings bilaterally. No lobar  consolidation. No pleural effusion or pneumothorax. The visualized skeletal structures are unremarkable. IMPRESSION: 1. No active cardiopulmonary disease. 2. Coarsened interstitial markings bilaterally, which may represent chronic bronchitic type lung changes. Electronically Signed   By: Davina Poke D.O.   On: 12/16/2020 17:45   DG Lumbar Spine 2-3 Views  Result Date: 12/16/2020 CLINICAL DATA:  [place], weakness for 2 days EXAM: LUMBAR SPINE - 2-3 VIEW COMPARISON:  None. FINDINGS: Frontal and lateral views of the lumbar spine demonstrate 5 non-rib-bearing lumbar type vertebral bodies. There is left convex scoliosis centered at the L4 level. There is grade 1 anterolisthesis of L5 on S1. No acute fractures. Prominent multilevel spondylosis and facet hypertrophy from L2-3 through L5-S1. Sacroiliac joints appear unremarkable. IMPRESSION: 1. Left convex scoliosis, with extensive multilevel lumbar spondylosis and facet hypertrophy. 2. No acute fracture. Electronically Signed   By: Randa Ngo M.D.   On: 12/16/2020 17:41   CT Head Wo Contrast  Result Date: 12/16/2020 CLINICAL DATA:  Fall, head trauma EXAM: CT HEAD WITHOUT CONTRAST CT CERVICAL SPINE WITHOUT CONTRAST TECHNIQUE: Multidetector CT imaging of the head and cervical spine was performed following the standard protocol without intravenous contrast. Multiplanar CT image reconstructions of the cervical spine were also generated. COMPARISON:  08/16/2019 FINDINGS: CT HEAD FINDINGS Brain: No evidence of acute infarction, hemorrhage, hydrocephalus, extra-axial collection or mass lesion/mass effect. Periventricular and deep white matter hypodensity. Mild global cerebral volume loss. Vascular: No hyperdense vessel or unexpected calcification. Skull: Normal. Negative for fracture or focal lesion. Sinuses/Orbits: No acute finding. Other: None. CT CERVICAL SPINE FINDINGS Alignment: Degenerative straightening of the normal cervical lordosis. Skull base and  vertebrae: No acute fracture. No primary bone lesion or focal pathologic process. Soft tissues and spinal canal: No prevertebral fluid or swelling. No visible canal hematoma. Disc levels: Moderate to severe disc space height loss and osteophytosis from C4 through C7. Upper chest: Negative. Other: None. IMPRESSION: 1. No acute intracranial pathology. Small-vessel white matter disease and mild global cerebral volume loss. 2. No fracture or static subluxation of the cervical spine. 3. Moderate to severe disc space height loss and osteophytosis from C4 through C7. Electronically Signed   By: Delanna Ahmadi M.D.   On: 12/16/2020 17:01   CT Cervical Spine Wo Contrast  Result Date: 12/16/2020 CLINICAL DATA:  Fall, head trauma EXAM: CT HEAD WITHOUT CONTRAST CT CERVICAL SPINE WITHOUT CONTRAST TECHNIQUE: Multidetector CT imaging of the head and cervical spine was performed following the standard protocol without intravenous contrast. Multiplanar CT image reconstructions of the cervical spine were also generated. COMPARISON:  08/16/2019 FINDINGS: CT HEAD FINDINGS Brain: No evidence of acute infarction, hemorrhage, hydrocephalus, extra-axial collection or mass lesion/mass effect. Periventricular and deep white matter hypodensity. Mild global cerebral volume loss. Vascular: No hyperdense vessel or unexpected calcification. Skull: Normal. Negative for fracture  or focal lesion. Sinuses/Orbits: No acute finding. Other: None. CT CERVICAL SPINE FINDINGS Alignment: Degenerative straightening of the normal cervical lordosis. Skull base and vertebrae: No acute fracture. No primary bone lesion or focal pathologic process. Soft tissues and spinal canal: No prevertebral fluid or swelling. No visible canal hematoma. Disc levels: Moderate to severe disc space height loss and osteophytosis from C4 through C7. Upper chest: Negative. Other: None. IMPRESSION: 1. No acute intracranial pathology. Small-vessel white matter disease and mild  global cerebral volume loss. 2. No fracture or static subluxation of the cervical spine. 3. Moderate to severe disc space height loss and osteophytosis from C4 through C7. Electronically Signed   By: Delanna Ahmadi M.D.   On: 12/16/2020 17:01   DG Hip Unilat W or Wo Pelvis 2-3 Views Left  Result Date: 12/16/2020 CLINICAL DATA:  [place], decreased movement EXAM: DG HIP (WITH OR WITHOUT PELVIS) 2-3V LEFT; DG HIP (WITH OR WITHOUT PELVIS) 2-3V RIGHT COMPARISON:  None. FINDINGS: Frontal view of the pelvis as well as frontal and frogleg lateral views of both hips are obtained. Left hip: There is a basicervical left femoral neck fracture, with mild impaction at the fracture site. No dislocation. Visualized portions of the left hemipelvis are unremarkable. Right hip: No fracture, subluxation, or dislocation. Joint spaces are well preserved. Visualized portions of the right hemipelvis are unremarkable. IMPRESSION: 1. Basicervical left femoral neck fracture with mild impaction at the fracture site. 2. Unremarkable right hip. Electronically Signed   By: Randa Ngo M.D.   On: 12/16/2020 17:42   DG HIP UNILAT WITH PELVIS 2-3 VIEWS RIGHT  Result Date: 12/16/2020 CLINICAL DATA:  [place], decreased movement EXAM: DG HIP (WITH OR WITHOUT PELVIS) 2-3V LEFT; DG HIP (WITH OR WITHOUT PELVIS) 2-3V RIGHT COMPARISON:  None. FINDINGS: Frontal view of the pelvis as well as frontal and frogleg lateral views of both hips are obtained. Left hip: There is a basicervical left femoral neck fracture, with mild impaction at the fracture site. No dislocation. Visualized portions of the left hemipelvis are unremarkable. Right hip: No fracture, subluxation, or dislocation. Joint spaces are well preserved. Visualized portions of the right hemipelvis are unremarkable. IMPRESSION: 1. Basicervical left femoral neck fracture with mild impaction at the fracture site. 2. Unremarkable right hip. Electronically Signed   By: Randa Ngo M.D.   On:  12/16/2020 17:42    Positive ROS: All other systems have been reviewed and were otherwise negative with the exception of those mentioned in the HPI and as above.  Physical Exam: General: Alert, no acute distress Cardiovascular: No pedal edema Respiratory: No cyanosis, no use of accessory musculature GI: No organomegaly, abdomen is soft and non-tender Skin: No lesions in the area of chief complaint Neurologic: Sensation intact distally Psychiatric: Patient is competent for consent with normal mood and affect Lymphatic: No axillary or cervical lymphadenopathy  MUSCULOSKELETAL: Healthy male no acute distress.  He is oriented.  Left leg is shortened and rotated.  There is pain with movement.  The skin is intact.  Neurovascular status is good.  Right leg has no pain and normal range of motion.  Upper extremities are normal.  The back is nontender.   Assessment: Displaced subcapital fracture left hip  Plan: Left hip hemiarthroplasty    Park Breed, MD [phone]   12/17/2020 1:50 PM

## 2020-12-17 NOTE — ED Notes (Signed)
Fsbs 127

## 2020-12-17 NOTE — Anesthesia Procedure Notes (Signed)
Spinal  Patient location during procedure: OR Start time: 12/17/2020 3:18 PM End time: 12/17/2020 3:23 PM Reason for block: surgical anesthesia Staffing Performed: resident/CRNA  Anesthesiologist: Reed Breech, MD Resident/CRNA: Lynden Oxford, CRNA Preanesthetic Checklist Completed: patient identified, IV checked, site marked, risks and benefits discussed, surgical consent, monitors and equipment checked, pre-op evaluation and timeout performed Spinal Block Patient position: right lateral decubitus Prep: ChloraPrep Patient monitoring: heart rate, cardiac monitor, continuous pulse ox and blood pressure Approach: midline Location: L3-4 Injection technique: single-shot Needle Needle type: Pencan  Needle gauge: 25 G Needle length: 9 cm Assessment Sensory level: T4 Events: CSF return

## 2020-12-17 NOTE — Transfer of Care (Signed)
Immediate Anesthesia Transfer of Care Note  Patient: Kyle Spence  Procedure(s) Performed: ARTHROPLASTY BIPOLAR HIP (HEMIARTHROPLASTY) (Left: Hip)  Patient Location: PACU  Anesthesia Type:Spinal  Level of Consciousness: drowsy  Airway & Oxygen Therapy: Patient Spontanous Breathing  Post-op Assessment: Report given to RN and Post -op Vital signs reviewed and stable  Post vital signs: Reviewed and stable  Last Vitals:  Vitals Value Taken Time  BP 116/58 12/17/20 1719  Temp    Pulse 88 12/17/20 1722  Resp 22 12/17/20 1722  SpO2 96 % 12/17/20 1722  Vitals shown include unvalidated device data.  Last Pain:  Vitals:   12/17/20 1446  TempSrc: Temporal  PainSc:       Patients Stated Pain Goal: 2 (12/17/20 0538)  Complications: No notable events documented.

## 2020-12-17 NOTE — H&P (Signed)
THE PATIENT WAS SEEN PRIOR TO SURGERY TODAY.  HISTORY, ALLERGIES, HOME MEDICATIONS AND OPERATIVE PROCEDURE WERE REVIEWED. RISKS AND BENEFITS OF SURGERY DISCUSSED WITH PATIENT AGAIN.  NO CHANGES FROM INITIAL HISTORY AND PHYSICAL NOTED.    

## 2020-12-17 NOTE — Anesthesia Preprocedure Evaluation (Addendum)
Anesthesia Evaluation  Patient identified by MRN, date of birth, ID band Patient awake    Reviewed: Allergy & Precautions, NPO status , Patient's Chart, lab work & pertinent test results  History of Anesthesia Complications Negative for: history of anesthetic complications  Airway Mallampati: IV   Neck ROM: Full    Dental   Missing several molars:   Pulmonary neg pulmonary ROS,    Pulmonary exam normal breath sounds clear to auscultation       Cardiovascular hypertension, Normal cardiovascular exam Rhythm:Regular Rate:Normal  ECG 12/17/19:  Normal sinus rhythm Possible Anterior infarct , age undetermined   Neuro/Psych PSYCHIATRIC DISORDERS (Alzheimer) Dementia negative neurological ROS     GI/Hepatic negative GI ROS,   Endo/Other  Prediabetes, obesity  Renal/GU Renal disease (stage III CKD)     Musculoskeletal Gout    Abdominal   Peds  Hematology negative hematology ROS (+)   Anesthesia Other Findings   Reproductive/Obstetrics                            Anesthesia Physical Anesthesia Plan  ASA: 2  Anesthesia Plan: General and Spinal   Post-op Pain Management:    Induction: Intravenous  PONV Risk Score and Plan: 2 and TIVA, Propofol infusion and Treatment may vary due to age or medical condition  Airway Management Planned: Natural Airway  Additional Equipment:   Intra-op Plan:   Post-operative Plan:   Informed Consent: I have reviewed the patients History and Physical, chart, labs and discussed the procedure including the risks, benefits and alternatives for the proposed anesthesia with the patient or authorized representative who has indicated his/her understanding and acceptance.     Dental advisory given and Consent reviewed with POA (pt's son/POA at bedside)  Plan Discussed with: CRNA  Anesthesia Plan Comments: (Plan for spinal and GA with natural airway, LMA/GETA  backup.  Patient and son consented for risks of anesthesia including but not limited to:  - adverse reactions to medications - headache, bleeding, infection, nerve damage 2/2 spinal - damage to eyes, teeth, lips or other oral mucosa - nerve damage due to positioning  - sore throat or hoarseness - damage to heart, brain, nerves, lungs, other parts of body or loss of life  Informed patient about role of CRNA in peri- and intra-operative care.  Patient and son voiced understanding.)       Anesthesia Quick Evaluation

## 2020-12-17 NOTE — Progress Notes (Signed)
Patient ID: Kyle Spence, male   DOB: 02-13-1932, 87 y.o.   MRN: [id number] [facility] PROGRESS NOTE  Kyle Spence RCV:[id number] DOB: 12/06/1932 DOA: 12/16/2020 PCP: Marisue Ivan, MD  HPI/Subjective: Patient having some left hip pain.  No chest pain or shortness of breath.  Patient states that he was using his walker and had a fall.  No loss of consciousness no syncope.  Objective: Vitals:   12/17/20 1113 12/17/20 1134  BP: (!) 176/76 (!) 172/67  Pulse: 90 93  Resp: 17 18  Temp: 98.2 F (36.8 C) 98.4 F (36.9 C)  SpO2: 99% 92%    Intake/Output Summary (Last 24 hours) at 12/17/2020 1246 Last data filed at 12/17/2020 0900 Gross per 24 hour  Intake 425.86 ml  Output --  Net 425.86 ml   Filed Weights   12/16/20 1601  Weight: 99 kg    ROS: Review of Systems  Respiratory:  Negative for shortness of breath.   Cardiovascular:  Negative for chest pain.  Gastrointestinal:  Negative for abdominal pain, nausea and vomiting.  Exam: Physical Exam HENT:     Head: Normocephalic.     Mouth/Throat:     Pharynx: No oropharyngeal exudate.  Eyes:     General: Lids are normal.     Conjunctiva/sclera: Conjunctivae normal.  Cardiovascular:     Rate and Rhythm: Normal rate and regular rhythm.     Heart sounds: Normal heart sounds, S1 normal and S2 normal.  Pulmonary:     Breath sounds: No decreased breath sounds, wheezing, rhonchi or rales.  Abdominal:     Palpations: Abdomen is soft.     Tenderness: There is no abdominal tenderness.  Musculoskeletal:     Right lower leg: No swelling.     Left lower leg: No swelling.  Skin:    General: Skin is warm.     Findings: No rash.  Neurological:     Mental Status: He is alert and oriented to person, place, and time.      Scheduled Meds:  atorvastatin  40 mg Oral QHS   chlorhexidine  1 application Topical Once   insulin aspart  0-15 Units Subcutaneous TID WC   insulin aspart  0-5 Units Subcutaneous QHS    Continuous Infusions:  sodium chloride 75 mL/hr at 12/17/20 0700    ceFAZolin (ANCEF) IV     [START ON 12/18/2020] cefTRIAXone (ROCEPHIN)  IV     clindamycin (CLEOCIN) IV      Assessment/Plan:  Pre-op for Left femoral neck fracture secondary to fall.  No contraindications to surgery at this time Essential hypertension restart verapamil CR. Acute kidney injury on chronic kidney disease stage IIIa.  Creatinine 1.77 on presentation down to 1.46.  Hold enalapril. Impaired fasting glucose.  Hemoglobin A1c low at 5.6. Positive urinalysis started on Rocephin.  Wait for urine culture Hyperlipidemia unspecified on atorvastatin     Code Status:     Code Status Orders  (From admission, onward)           Start     Ordered   12/16/20 2235  Full code  Continuous        12/16/20 2237           Code Status History     This patient has a current code status but no historical code status.      Advance Directive Documentation    Flowsheet Row Most Recent Value  Type of Advance Directive Healthcare Power of [place], Living will  Pre-existing out of facility DNR order (yellow form or pink MOST form) --  "MOST" Form in Place? --      Family Communication: Updated son on the phone Disposition Plan: Status is: Inpatient  Consultants: Orthopedic surgery  Time spent: 28 minutes  [place]

## 2020-12-17 NOTE — Progress Notes (Addendum)
Initial Nutrition Assessment  DOCUMENTATION CODES:   Obesity unspecified  INTERVENTION:   -Once diet is advanced, add:   -Glucerna Shake po BID, each supplement provides 220 kcal and 10 grams of protein  -Ensure Max po daily, each supplement provides 150 kcal and 30 grams of protein -MVI with minerals daily  NUTRITION DIAGNOSIS:   Increased nutrient needs related to post-op healing as evidenced by estimated needs.  GOAL:   Patient will meet greater than or equal to 90% of their needs  MONITOR:   PO intake, Supplement acceptance, Diet advancement, Labs, Weight trends, Skin, I & O's  REASON FOR ASSESSMENT:   Consult Assessment of nutrition requirement/status, Hip fracture protocol  ASSESSMENT:   Kyle Spence is a 87 y.o. male with medical history significant for hypertension, hyperlipidemia, history of gout, mild dementia, who presents emergency department for chief concerns of a fall.  Pt admitted with lt femoral neck fracture secondary to mechanical fall.   Reviewed I/O's: +426 ml x 24 hours  Pt unavailable at time of visit. Attempted to speak with pt via call to hospital room phone, however, unable to reach. RD unable to obtain further nutrition-related history or complete nutrition-focused physical exam at this time.    Pt currently NPO for surgery today.  Pt previously on a heart healthy, carb modified diet. No meal completion data available to assess at this time.    Reviewed wt hx; pt wt has been stable over the past 3 months.   Labs reviewed: CBGS: 123-127 (inpatient orders for glycemic control are 0-15 units insulin aspart TID with meals and 0-5 units insulin aspart daily at bedtime).    Diet Order:   Diet Order             Diet NPO time specified  Diet effective midnight                   EDUCATION NEEDS:   No education needs have been identified at this time  Skin:  Skin Assessment: Skin Integrity Issues: Skin Integrity Issues:: Other  (Comment) Other: MASD to buttocks, coccyx, groin, penis, and scrotum  Last BM:  12/16/20  Height:   Ht Readings from Last 1 Encounters:  12/17/20 5\' 8"  (1.727 m)    Weight:   Wt Readings from Last 1 Encounters:  12/17/20 99 kg    Ideal Body Weight:  70 kg  BMI:  Body mass index is 33.19 kg/m.  Estimated Nutritional Needs:   Kcal:  1900-2100  Protein:  90-105 grams  Fluid:  > 1.9 L    12/19/20, RD, LDN, CDCES Registered Dietitian II Certified Diabetes Care and Education Specialist Please refer to [facility] for RD and/or RD on-call/weekend/after hours pager

## 2020-12-17 NOTE — Op Note (Signed)
12/17/2020  5:18 PM  PATIENT:  Kyle Spence   MRN: [id number]  PRE-OPERATIVE DIAGNOSIS:  Displaced Subcapital fracture left hip   POST-OPERATIVE DIAGNOSIS: Same  PROCEDURE: Left   hip hemiarthroplasty with Stryker Accolade prosthesis  PREOPERATIVE INDICATIONS:  Kyle Spence is an 87 y.o. male who was admitted 12/16/2020 with a diagnosis of displaced subcapital fracture of the hip and elected for surgical management.  The risks benefits and alternatives were discussed with the patient including but not limited to the risks of nonoperative treatment, versus surgical intervention including infection, bleeding, nerve injury, periprosthetic fracture, the need for revision surgery, dislocation, leg length discrepancy, blood clots, cardiopulmonary complications, morbidity, mortality, among others, and they were willing to proceed.  Predicted outcome is good, although there will be at least a six to nine month expected recovery.     SURGEON:  Earnestine Leys, MD  ASST: Carlynn Spry, PA-C    ANESTHESIA: Spinal    COMPLICATIONS:  None.   EBL: 50 cc    COMPONENTS:  Stryker Accolade Femoral Fracture stem size #5,   and a size  53 mm  fracture head unipolar hip ball with    standard mm  neck length.    PROCEDURE IN DETAIL: The patient was met in the holding area and identified.  The appropriate hip  was marked at the operative site. The patient was then transported to the OR and  placed under general anesthesia.  At that point, the patient was  placed in the lateral decubitus position with the operative side up and  secured to the operating room table and all bony prominences padded.     The operative lower extremity was prepped from the iliac crest to the toes.  Sterile draping was performed.  Time out was performed prior to incision.      A routine posterolateral approach was utilized via sharp dissection  carried down to the subcutaneous tissue.  Gross bleeders were Bovie  coagulated.  The  iliotibial band was identified and incised  along the length of the skin incision.  Self-retaining retractors were  inserted.  With the hip internally rotated, the short external rotators  were identified. The piriformis was tagged and the hip capsule released in a T-type fashion.  The femoral neck was exposed, and I resected the femoral neck using the appropriate jig. This was performed at approximately a thumb's breadth above the lesser trochanter.    I then exposed the deep acetabulum, cleared out any tissue including the ligamentum teres.    I then prepared the proximal femur using the cookie-cutter, the lateralizing reamer, and then sequentially broached.  A trial stem   was  utilized along with a unipolar head and neck.  I reduced the hip and it was found to have excellent stability with functional range of motion. Leg lengths were equal.  The trial components were then removed.   The same size Accolade femoral stem was then inserted and was very stable.  The Unitrax head and neck as trialed were inserted as well.     The hip was then reduced and taken through functional range of motion and found to have excellent stability. Leg lengths were restored.     I closed the T in the capsule with #2 Ticron as well as the short external rotators. A hemovac was inserted.    I then irrigated the hip copiously again with pulse lavage, and repaired the fascia with #2 Quill and the subcutaneous layer with #0  Quill. Sponge and needle counts were correct. Dry sterile Aquacell was applied.   The patient was then awakened and returned to PACU in stable and satisfactory condition. There were no complications.  Park Breed, MD Orthopedic Surgeon [phone]   12/17/2020 5:18 PM

## 2020-12-17 NOTE — Plan of Care (Signed)

## 2020-12-18 ENCOUNTER — Encounter: Payer: Self-pay | Admitting: Specialist

## 2020-12-18 DIAGNOSIS — F03A0 Unspecified dementia, mild, without behavioral disturbance, psychotic disturbance, mood disturbance, and anxiety: Secondary | ICD-10-CM

## 2020-12-18 DIAGNOSIS — N3000 Acute cystitis without hematuria: Secondary | ICD-10-CM

## 2020-12-18 LAB — CBC
HCT: 30.6 % — ABNORMAL LOW (ref 39.0–52.0)
Hemoglobin: 10 g/dL — ABNORMAL LOW (ref 13.0–17.0)
MCH: 31.8 pg (ref 26.0–34.0)
MCHC: 32.7 g/dL (ref 30.0–36.0)
MCV: 97.5 fL (ref 80.0–100.0)
Platelets: 146 10*3/uL — ABNORMAL LOW (ref 150–400)
RBC: 3.14 MIL/uL — ABNORMAL LOW (ref 4.22–5.81)
RDW: 13.8 % (ref 11.5–15.5)
WBC: 10.7 10*3/uL — ABNORMAL HIGH (ref 4.0–10.5)
nRBC: 0 % (ref 0.0–0.2)

## 2020-12-18 LAB — URINE CULTURE
Culture: >=100000 — AB
Culture: >=100000 — AB

## 2020-12-18 LAB — BASIC METABOLIC PANEL
Anion gap: 6 (ref 5–15)
BUN: 32 mg/dL — ABNORMAL HIGH (ref 8–23)
CO2: 26 mmol/L (ref 22–32)
Calcium: 8.1 mg/dL — ABNORMAL LOW (ref 8.9–10.3)
Chloride: 103 mmol/L (ref 98–111)
Creatinine, Ser: 1.72 mg/dL — ABNORMAL HIGH (ref 0.61–1.24)
GFR, Estimated: 38 mL/min — ABNORMAL LOW (ref >60)
Glucose, Bld: 120 mg/dL — ABNORMAL HIGH (ref 70–99)
Potassium: 4.5 mmol/L (ref 3.5–5.1)
Sodium: 135 mmol/L (ref 135–145)

## 2020-12-18 LAB — GLUCOSE, CAPILLARY
Glucose-Capillary: 103 mg/dL — ABNORMAL HIGH (ref 70–99)
Glucose-Capillary: 112 mg/dL — ABNORMAL HIGH (ref 70–99)
Glucose-Capillary: 107 mg/dL — ABNORMAL HIGH (ref 70–99)
Glucose-Capillary: 162 mg/dL — ABNORMAL HIGH (ref 70–99)

## 2020-12-18 MED ORDER — SODIUM CHLORIDE 0.9 % IV SOLN: INTRAVENOUS | Status: DC

## 2020-12-18 NOTE — Progress Notes (Signed)
Nutrition Follow-up  DOCUMENTATION CODES:   Obesity unspecified  INTERVENTION:   -D/c Ensure Max -Continue Glucerna Shake po BID, each supplement provides 220 kcal and 10 grams of protein  -Continue MVI with minerals daily  NUTRITION DIAGNOSIS:   Increased nutrient needs related to post-op healing as evidenced by estimated needs.  Ongoing  GOAL:   Patient will meet greater than or equal to 90% of their needs  Progressing   MONITOR:   PO intake, Supplement acceptance, Diet advancement, Labs, Weight trends, Skin, I & O's  REASON FOR ASSESSMENT:   Consult Assessment of nutrition requirement/status, Hip fracture protocol  ASSESSMENT:   Kyle Spence is a 87 y.o. male with medical history significant for hypertension, hyperlipidemia, history of gout, mild dementia, who presents emergency department for chief concerns of a fall.  11/22- PROCEDURE: Left   hip hemiarthroplasty with Stryker Accolade prosthesis  Reviewed I/O's: -370 ml x 24 hours and +56 ml since admission  UOP: 720 ml x 24 hours   Spoke with pt and son at bedside. Pt eating lunch at time of visit without difficulty. Per pt son, pt with very good appetite presently. He also eats very well at home and has Meals on Wheels. Pt also snacks throughout the day.   Per son, pt with no weight loss. Wt has been stable over the past 3 months.   Discussed importance of good meal and supplement intake to promote healing.   Medications reviewed and include ferrous sulfate.   Labs reviewed: CBGS: 96-159 (inpatient orders for glycemic control are 0-5 units insulin aspart daily at bedtime and 0-15 units insulin aspart TID with meals).    NUTRITION - FOCUSED PHYSICAL EXAM:  Flowsheet Row Most Recent Value  Orbital Region No depletion  Upper Arm Region No depletion  Thoracic and Lumbar Region No depletion  Buccal Region No depletion  Temple Region No depletion  Clavicle Bone Region No depletion  Clavicle and  Acromion Bone Region No depletion  Scapular Bone Region No depletion  Dorsal Hand No depletion  Patellar Region No depletion  Anterior Thigh Region No depletion  Posterior Calf Region No depletion  Edema (RD Assessment) Mild  Hair Reviewed  Eyes Reviewed  Mouth Reviewed  Skin Reviewed  Nails Reviewed       Diet Order:   Diet Order             Diet Carb Modified Fluid consistency: Thin; Room service appropriate? Yes  Diet effective now                   EDUCATION NEEDS:   No education needs have been identified at this time  Skin:  Skin Assessment: Skin Integrity Issues: Skin Integrity Issues:: Other (Comment) Other: MASD to buttocks, coccyx, groin, penis, and scrotum  Last BM:  12/16/20  Height:   Ht Readings from Last 1 Encounters:  12/17/20 5\' 8"  (1.727 m)    Weight:   Wt Readings from Last 1 Encounters:  12/17/20 99 kg    Ideal Body Weight:  70 kg  BMI:  Body mass index is 33.19 kg/m.  Estimated Nutritional Needs:   Kcal:  1900-2100  Protein:  90-105 grams  Fluid:  > 1.9 L    12/19/20, RD, LDN, CDCES Registered Dietitian II Certified Diabetes Care and Education Specialist Please refer to [facility] for RD and/or RD on-call/weekend/after hours pager

## 2020-12-18 NOTE — Evaluation (Signed)
Physical Therapy Evaluation Patient Details Name: Kyle Spence MRN: [id number] DOB: 06/07/1932 Today's Date: 12/18/2020  History of Present Illness  Pt is an 87 yo male s/p fall and L hip hemiarthroplasty, PWB. PMH of HTN, dementia, CKD III.  Clinical Impression  Patient alert, oriented to self, place, some pieces of the situation. At rest reported 3/10 pain. Family in room throughout to assist with PLOF, history, and home set up. Pt previously lived alone with a son that checks in daily around 5pm, assists with IADLs as needed, but patient previously modI for ambulation with RW, and ADLs. Has meals on wheels. Pt/family educated on PWB and posterior hip precautions.  The patient was able to perform several supine exercises with constant tactile and verbal cues. AAROM for LLE, exhibited moderate pain signs/symptoms. AROM for RLE. Supine to sit with maxA, HOB elevated, and use of bed rails. Progressed sitting balance from modA-CGA with time and encouragement. LAQ x10 bilaterally in sitting. MaxAx2 with RW to attempt standing three times, pt unable to come up into fully standing, and very fearful throughout. Education and encouragement provided as able. Returned to supine maxAx2, all needs in reach.  Overall the patient demonstrated deficits (see "PT Problem List") that impede the patient's functional abilities, safety, and mobility and would benefit from skilled PT intervention. Recommendation is SNF due to current level of assistance needed and decline from PLOF.      Recommendations for follow up therapy are one component of a multi-disciplinary discharge planning process, led by the attending physician.  Recommendations may be updated based on patient status, additional functional criteria and insurance authorization.  Follow Up Recommendations Skilled nursing-short term rehab (<3 hours/day)    Assistance Recommended at Discharge    Functional Status Assessment Patient has had a recent decline  in their functional status and demonstrates the ability to make significant improvements in function in a reasonable and predictable amount of time.  Equipment Recommendations  Other (comment) (TBD at next venue of care)    Recommendations for Other Services OT consult     Precautions / Restrictions Precautions Precautions: Fall;Posterior Hip Restrictions Weight Bearing Restrictions: Yes LLE Weight Bearing: Partial weight bearing LLE Partial Weight Bearing Percentage or Pounds: 50%      Mobility  Bed Mobility Overal bed mobility: Needs Assistance Bed Mobility: Supine to Sit;Sit to Supine     Supine to sit: Max assist;HOB elevated Sit to supine: Max assist;+2 for safety/equipment        Transfers Overall transfer level: Needs assistance Equipment used: Rolling walker (2 wheels) Transfers: Sit to/from Stand Sit to Stand: Max assist;+2 physical assistance;From elevated surface           General transfer comment: performed three times, max mulitmodal cues to perform. unable to stand fully upright    Ambulation/Gait               General Gait Details: unable  Stairs            Wheelchair Mobility    Modified Rankin (Stroke Patients Only)       Balance Overall balance assessment: Needs assistance Sitting-balance support: Feet supported Sitting balance-Leahy Scale: Fair Sitting balance - Comments: initially reliant on UE, as confidence improved able to maintain with CGA   Standing balance support: Reliant on assistive device for balance Standing balance-Leahy Scale: Zero Standing balance comment: posterior lean  Pertinent Vitals/Pain Pain Assessment: 0-10 Pain Score: 3  Pain Location: but with mobility and standing attempts exhibits significant pain Pain Descriptors / Indicators: Guarding;Grimacing;Moaning Pain Intervention(s): Limited activity within patient's tolerance;Monitored during  session;Repositioned;Premedicated before session    Home Living Family/patient expects to be discharged to:: Private residence Living Arrangements: Alone Available Help at Discharge: Family;Available PRN/intermittently;Other (Comment) (has a son that checks in every day at 5pm) Type of Home: House Home Access: Ramped entrance       Home Layout: One level Home Equipment: Agricultural consultant (2 wheels);Cane - single point;Grab bars - tub/shower;Shower seat (adjustable bed) Additional Comments: lift recliner    Prior Function Prior Level of Function : Needs assist             Mobility Comments: uses RW at baseline. ADLs Comments: son is over daily to assist with ADLs/IADLs as needed. has meals on wheels     Hand Dominance   Dominant Hand: Right    Extremity/Trunk Assessment   Upper Extremity Assessment Upper Extremity Assessment: Defer to OT evaluation    Lower Extremity Assessment Lower Extremity Assessment: Generalized weakness (able to lift RLE against gravity, assistance for LLE needed throughout)    Cervical / Trunk Assessment Cervical / Trunk Assessment: Kyphotic  Communication   Communication: No difficulties  Cognition Arousal/Alertness: Awake/alert Behavior During Therapy: WFL for tasks assessed/performed;Anxious Overall Cognitive Status: History of cognitive impairments - at baseline                                 General Comments: oriented to self, place, some situational facts        General Comments      Exercises Total Joint Exercises Ankle Circles/Pumps: AROM;Both;10 reps Heel Slides: AROM;Right;10 reps;AAROM;Left;Strengthening Hip ABduction/ADduction: AROM;Strengthening;Right;10 reps;AAROM;Left   Assessment/Plan    PT Assessment Patient needs continued PT services  PT Problem List Decreased strength;Decreased mobility;Decreased safety awareness;Decreased range of motion;Decreased knowledge of precautions;Decreased activity  tolerance;Decreased balance;Decreased knowledge of use of DME;Pain       PT Treatment Interventions DME instruction;Therapeutic exercise;Gait training;Balance training;Stair training;Neuromuscular re-education;Functional mobility training;Therapeutic activities;Patient/family education    PT Goals (Current goals can be found in the Care Plan section)  Acute Rehab PT Goals Patient Stated Goal: to get better PT Goal Formulation: With patient Time For Goal Achievement: 01/01/21 Potential to Achieve Goals: Good    Frequency 7X/week   Barriers to discharge Decreased caregiver support      Co-evaluation               AM-PAC PT "6 Clicks" Mobility  Outcome Measure Help needed turning from your back to your side while in a flat bed without using bedrails?: A Lot Help needed moving from lying on your back to sitting on the side of a flat bed without using bedrails?: A Lot Help needed moving to and from a bed to a chair (including a wheelchair)?: Total Help needed standing up from a chair using your arms (e.g., wheelchair or bedside chair)?: A Lot Help needed to walk in hospital room?: Total Help needed climbing 3-5 steps with a railing? : Total 6 Click Score: 9    End of Session Equipment Utilized During Treatment: Gait belt Activity Tolerance: Patient limited by pain;Other (comment) (limited by fear) Patient left: in bed;with call bell/phone within reach;with nursing/sitter in room;with family/visitor present;with bed alarm set;with SCD's reapplied Nurse Communication: Mobility status PT Visit Diagnosis: Other abnormalities of gait and  mobility (R26.89);Muscle weakness (generalized) (M62.81);Pain;Difficulty in walking, not elsewhere classified (R26.2) Pain - Right/Left: Left Pain - part of body: Hip    Time: 2694-8546 PT Time Calculation (min) (ACUTE ONLY): 24 min   Charges:   PT Evaluation $PT Eval Low Complexity: 1 Low PT Treatments $Therapeutic Exercise: 8-22  mins $Therapeutic Activity: 8-22 mins       Olga Coaster PT, DPT 1:32 PM,12/18/20

## 2020-12-18 NOTE — Progress Notes (Signed)
Subjective: 1 Day Post-Op Procedure(s) (LRB): ARTHROPLASTY BIPOLAR HIP (HEMIARTHROPLASTY) (Left) Patient is 1 day post left hip hemiarthroplasty.  He is doing well and sitting up at the side of the bed with PT.  His son is in the room today as well.  He did well last night.  Hemoglobin is stable at 10.0.  Dressings are dry.  Patient reports pain as mild.  Objective:   VITALS:   Vitals:   12/18/20 0750 12/18/20 1125  BP: (!) 128/58 133/62  Pulse: 85 88  Resp: 16 18  Temp: 98.3 F (36.8 C) 98.5 F (36.9 C)  SpO2: 96% 99%    Leg lengths are equal.  Dressing is dry left hip.  Range of motion is good.  LABS Recent Labs    12/16/20 1606 12/17/20 0651 12/18/20 0309  HGB 14.0 13.3 10.0*  HCT 42.4 40.2 30.6*  WBC 15.5* 10.9* 10.7*  PLT 217 189 146*    Recent Labs    12/16/20 1606 12/17/20 0651 12/18/20 0309  NA 134* 138 135  K 4.8 4.5 4.5  BUN 39* 28* 32*  CREATININE 1.77* 1.46* 1.72*  GLUCOSE 292* 139* 120*    Recent Labs    12/16/20 2316  INR 1.1     Assessment/Plan: 1 Day Post-Op Procedure(s) (LRB): ARTHROPLASTY BIPOLAR HIP (HEMIARTHROPLASTY) (Left)   Advance diet Up with therapy Discharge to SNF

## 2020-12-18 NOTE — TOC Progression Note (Addendum)
Transition of Care [facility]) - Progression Note    Patient Details  Name: Kyle Spence MRN: [id number] Date of Birth: 1932-10-18  Transition [facility]) CM/SW Contact  Marlowe Sax, RN Phone Number: 12/18/2020, 2:00 PM  Clinical Narrative:     Spoke with the patient and his son in the room we reviewed the bed options and the Medicare Star rating.  They chose [facility], I notified Tammy at [facility], I submitted the clinical to get ins auth with [facility] approval obtained ref number [id number] start date 11/23       Expected Discharge Plan and Services                                                 Social Determinants of Health (SDOH) Interventions    Readmission Risk Interventions No flowsheet data found.

## 2020-12-18 NOTE — NC FL2 (Signed)
[place] LEVEL OF CARE SCREENING TOOL     IDENTIFICATION  Patient Name: Kyle Spence Birthdate: Aug 29, 1932 Sex: male Admission Date (Current Location): 12/16/2020  [facility] and [place] Number:  Engineering geologist and Address:  [facility], [address], [place], [place] [address]      Provider Number: [id number]  Attending Physician Name and Address:  Loletha Grayer, MD  Relative Name and Phone Number:  [name] [phone]    Current Level of Care: Hospital Recommended Level of Care: [employer] Prior Approval Number:    Date Approved/Denied:   PASRR Number: [id number]  Discharge Plan: SNF    Current Diagnoses: Patient Active Problem List   Diagnosis Date Noted   Hip fracture ([place]) 12/17/2020   Pre-op evaluation    Acute kidney injury superimposed on CKD ([place])    Impaired fasting glucose    Fracture of femoral neck, left ([place]) 12/16/2020   Stage 3b chronic kidney disease (CKD) ([place]) 12/16/2020   Leukocytosis 12/16/2020   Peripheral artery disease ([place]) 05/13/2016   Swelling of limb 03/11/2016   Pain in limb 03/11/2016   Borderline diabetes mellitus 02/07/2015   Chronic constipation 02/07/2015   Chronic renal failure 02/07/2015   Essential (primary) hypertension 02/07/2015   Edema leg 02/07/2015   Hyperlipidemia 02/07/2015   Adiposity 02/07/2015   Arthritis, degenerative 02/07/2015   Detrusor muscle hypertonia 02/07/2015   Peripheral nerve disease 02/07/2015   H/O: gout 04/26/2014    Orientation RESPIRATION BLADDER Height & Weight     Self, Time, Situation, Place  Normal Continent, Incontinent Weight: 99 kg Height:  5\' 8"  (172.7 cm)  BEHAVIORAL SYMPTOMS/MOOD NEUROLOGICAL BOWEL NUTRITION STATUS      Continent, Incontinent Diet (regular)  AMBULATORY STATUS COMMUNICATION OF NEEDS Skin   Extensive Assist Verbally Normal, Surgical wounds                       Personal Care Assistance Level  of Assistance  Bathing, Dressing Bathing Assistance: Limited assistance   Dressing Assistance: Limited assistance     Functional Limitations Info             SPECIAL CARE FACTORS FREQUENCY  PT (By licensed PT)     PT Frequency: 5 times per week              Contractures Contractures Info: Not present    Additional Factors Info  Code Status, Allergies Code Status Info: Full Code Allergies Info: Penecillin, Enalapril           Current Medications (12/18/2020):  This is the current hospital active medication list Current Facility-Administered Medications  Medication Dose Route Frequency Provider Last Rate Last Admin   0.9 %  sodium chloride infusion   Intravenous Continuous Wieting, Richard, MD       acetaminophen (TYLENOL) tablet 325-650 mg  325-650 mg Oral Q6H PRN Earnestine Leys, MD       allopurinol (ZYLOPRIM) tablet 100 mg  100 mg Oral Daily Earnestine Leys, MD   100 mg at 12/18/20 1013   alum & mag hydroxide-simeth (MAALOX/MYLANTA) 200-200-20 MG/5ML suspension 30 mL  30 mL Oral Q4H PRN Earnestine Leys, MD       aspirin EC tablet 81 mg  81 mg Oral Daily Earnestine Leys, MD   81 mg at 12/18/20 1012   atorvastatin (LIPITOR) tablet 40 mg  40 mg Oral Tora Duck, MD   40 mg at 12/17/20 2215   bisacodyl (DULCOLAX)  suppository 10 mg  10 mg Rectal Daily PRN Earnestine Leys, MD       ceFAZolin (ANCEF) IVPB 2g/100 mL premix  2 g Intravenous Lajuan Lines, MD 200 mL/hr at 12/18/20 0625 2 g at 12/18/20 [id number]   cefTRIAXone (ROCEPHIN) 2 g in sodium chloride 0.9 % 100 mL IVPB  2 g Intravenous Q24H Earnestine Leys, MD       celecoxib (CELEBREX) capsule 200 mg  200 mg Oral BID Earnestine Leys, MD   200 mg at 12/18/20 1012   chlorhexidine (HIBICLENS) 4 % liquid 1 application  1 application Topical Once Earnestine Leys, MD       clindamycin (CLEOCIN) IVPB 600 mg  600 mg Intravenous Lajuan Lines, MD 100 mL/hr at 12/18/20 0526 600 mg at 12/18/20 0526   docusate sodium (COLACE)  capsule 100 mg  100 mg Oral BID Earnestine Leys, MD   100 mg at 12/18/20 1012   enoxaparin (LOVENOX) injection 30 mg  30 mg Subcutaneous Q24H Earnestine Leys, MD   30 mg at 12/18/20 1003   feeding supplement (GLUCERNA SHAKE) ([place]) liquid 237 mL  237 mL Oral BID BM Earnestine Leys, MD       ferrous sulfate tablet 325 mg  325 mg Oral Q breakfast Earnestine Leys, MD   325 mg at 12/18/20 1012   HYDROcodone-acetaminophen (NORCO/VICODIN) 5-325 MG per tablet 1 tablet  1 tablet Oral Q4H PRN Earnestine Leys, MD   1 tablet at 12/18/20 1002   insulin aspart (novoLOG) injection 0-15 Units  0-15 Units Subcutaneous TID WC Earnestine Leys, MD       insulin aspart (novoLOG) injection 0-5 Units  0-5 Units Subcutaneous QHS Earnestine Leys, MD       menthol-cetylpyridinium (CEPACOL) lozenge 3 mg  1 lozenge Oral PRN Earnestine Leys, MD       Or   phenol (CHLORASEPTIC) mouth spray 1 spray  1 spray Mouth/Throat PRN Earnestine Leys, MD       methocarbamol (ROBAXIN) tablet 500 mg  500 mg Oral Q6H PRN Earnestine Leys, MD   500 mg at 12/18/20 1003   Or   methocarbamol (ROBAXIN) 500 mg in dextrose 5 % 50 mL IVPB  500 mg Intravenous Q6H PRN Earnestine Leys, MD       metoCLOPramide (REGLAN) tablet 5-10 mg  5-10 mg Oral Q8H PRN Earnestine Leys, MD       Or   metoCLOPramide (REGLAN) injection 5-10 mg  5-10 mg Intravenous Q8H PRN Earnestine Leys, MD       mirabegron ER [facility]) tablet 25 mg  25 mg Oral Daily Earnestine Leys, MD   25 mg at 12/18/20 1013   morphine 2 MG/ML injection 1 mg  1 mg Intravenous Q2H PRN Earnestine Leys, MD   1 mg at 12/17/20 1123   multivitamin with minerals tablet 1 tablet  1 tablet Oral Daily Earnestine Leys, MD   1 tablet at 12/18/20 1012   ondansetron (ZOFRAN) injection 4 mg  4 mg Intravenous Q8H PRN Earnestine Leys, MD       protein supplement (ENSURE MAX) liquid  11 oz Oral QHS Earnestine Leys, MD   11 oz at 12/17/20 2215   sodium phosphate (FLEET) 7-19 GM/118ML enema 1 enema  1 enema Rectal Once PRN  Earnestine Leys, MD       tamsulosin [facility]) capsule 0.4 mg  0.4 mg Oral Daily Earnestine Leys, MD   0.4 mg at 12/18/20 1012   verapamil (CALAN-SR) CR tablet 240 mg  240 mg Oral Daily Deeann Saint, MD   240 mg at 12/18/20 1012   zolpidem (AMBIEN) tablet 5 mg  5 mg Oral QHS PRN Deeann Saint, MD         Discharge Medications: Please see discharge summary for a list of discharge medications.  Relevant Imaging Results:  Relevant Lab Results:   Additional Information SS# [id number]  Marlowe Sax, RN

## 2020-12-18 NOTE — TOC Progression Note (Signed)
Transition of Care [facility]) - Progression Note    Patient Details  Name: Kyle Spence MRN: [id number] Date of Birth: 12-11-1932  Transition of Care [facility]) CM/SW [place], RN Phone Number: 12/18/2020, 10:36 AM  Clinical Narrative:     Met with the patient and on of his sons in the room to discuss needs and plan, He lives alone and has one sone stop by daily for about an hour.  He has fallen several times at home, We discussed the possibility of going to [facility] SNF, He is getting ready to work with PT and will decide what will work best afterwards.  He provided permission to go ahead and do a bed search for the potential of going to SNF.        Expected Discharge Plan and Services                                                 Social Determinants of Health (SDOH) Interventions    Readmission Risk Interventions No flowsheet data found.

## 2020-12-18 NOTE — Progress Notes (Addendum)
Patient ID: Kyle Spence, male   DOB: 16-Nov-1932, 87 y.o.   MRN: [id number] [facility] PROGRESS NOTE  Kyle Spence [id number] DOB: [birth year] DOA: 12/16/2020 PCP: Dion Body, MD  HPI/Subjective: Patient admitted with fall and left hip fracture.  Slight pain in hip.  Objective: Vitals:   12/18/20 0750 12/18/20 1125  BP: (!) 128/58 133/62  Pulse: 85 88  Resp: 16 18  Temp: 98.3 F (36.8 C) 98.5 F (36.9 C)  SpO2: 96% 99%    Intake/Output Summary (Last 24 hours) at 12/18/2020 1257 Last data filed at 12/18/2020 0930 Gross per 24 hour  Intake 450 ml  Output 720 ml  Net -270 ml   Filed Weights   12/16/20 1601 12/17/20 1446  Weight: 99 kg 99 kg    ROS: Review of Systems  Respiratory:  Negative for shortness of breath.   Cardiovascular:  Negative for chest pain.  Gastrointestinal:  Negative for abdominal pain, nausea and vomiting.  Musculoskeletal:  Positive for joint pain.  Exam: Physical Exam HENT:     Head: Normocephalic.     Mouth/Throat:     Pharynx: No oropharyngeal exudate.  Eyes:     General: Lids are normal.     Conjunctiva/sclera: Conjunctivae normal.  Cardiovascular:     Rate and Rhythm: Normal rate and regular rhythm.     Heart sounds: Normal heart sounds, S1 normal and S2 normal.  Pulmonary:     Breath sounds: Normal breath sounds. No decreased breath sounds, wheezing, rhonchi or rales.  Abdominal:     Palpations: Abdomen is soft.     Tenderness: There is no abdominal tenderness.  Musculoskeletal:     Right lower leg: Swelling present.     Left lower leg: Swelling present.  Skin:    General: Skin is warm.     Findings: No rash.  Neurological:     Mental Status: He is alert.      Scheduled Meds:  allopurinol  100 mg Oral Daily   aspirin EC  81 mg Oral Daily   atorvastatin  40 mg Oral QHS   celecoxib  200 mg Oral BID   chlorhexidine  1 application Topical Once   docusate sodium  100 mg Oral BID   enoxaparin (LOVENOX)  injection  30 mg Subcutaneous Q24H   feeding supplement (GLUCERNA SHAKE)  237 mL Oral BID BM   ferrous sulfate  325 mg Oral Q breakfast   insulin aspart  0-15 Units Subcutaneous TID WC   insulin aspart  0-5 Units Subcutaneous QHS   mirabegron ER  25 mg Oral Daily   multivitamin with minerals  1 tablet Oral Daily   Ensure Max Protein  11 oz Oral QHS   tamsulosin  0.4 mg Oral Daily   verapamil  240 mg Oral Daily   Continuous Infusions:  sodium chloride      ceFAZolin (ANCEF) IV 2 g (12/18/20 0625)   cefTRIAXone (ROCEPHIN)  IV     clindamycin (CLEOCIN) IV 600 mg (12/18/20 0526)   methocarbamol (ROBAXIN) IV     Brief history.  87 year old man past medical history of essential hypertension, neuropathy, BPH, chronic kidney disease, mild dementia presented after a fall and found to have a hip fracture.  A left hemiarthroplasty was performed by Dr. Sabra Heck on 12/17/2020.  Assessment/Plan:  Left femoral neck fracture status post operative repair with left Hemiarthroplasty.  Pain control.  Physical therapy.  Transitional care team to look into rehabs. Essential hypertension on verapamil CR.  Holding enalapril. Acute kidney injury on chronic kidney disease stage IIIa.  Creatinine 1.77 on presentation yesterday down to 1.46.  Today again up at 1.77.  Gentle IV fluids.  Hold enalapril. Acute cystitis without hematuria.  Streptococcus agalactiae growing out of culture would be covered by Rocephin. Hyperlipidemia unspecified on atorvastatin Impaired fasting glucose.  Hemoglobin A1c low at 5.6 Mild dementia as per son. History of gout on allopurinol BPH on Flomax     Code Status:     Code Status Orders  (From admission, onward)           Start     Ordered   12/16/20 2235  Full code  Continuous        12/16/20 2237           Code Status History     This patient has a current code status but no historical code status.      Advance Directive Documentation    Flowsheet Row  Most Recent Value  Type of Advance Directive Healthcare Power of Attorney, Living will  Pre-existing out of facility DNR order (yellow form or pink MOST form) --  "MOST" Form in Place? --      Family Communication: Updated patient's son on phone Disposition Plan: Status is: Inpatient  Consultants: Orthopedic surgery  Procedures: Left hemiarthroplasty  Antibiotics: Rocephin  Time spent: 27 minutes  Kyle Spence [employer]

## 2020-12-19 ENCOUNTER — Encounter: Payer: Self-pay | Admitting: Internal Medicine

## 2020-12-19 LAB — SURGICAL PATHOLOGY

## 2020-12-19 LAB — CBC
HCT: 29.5 % — ABNORMAL LOW (ref 39.0–52.0)
Hemoglobin: 9.8 g/dL — ABNORMAL LOW (ref 13.0–17.0)
MCH: 32.3 pg (ref 26.0–34.0)
MCHC: 33.2 g/dL (ref 30.0–36.0)
MCV: 97.4 fL (ref 80.0–100.0)
Platelets: 132 10*3/uL — ABNORMAL LOW (ref 150–400)
RBC: 3.03 MIL/uL — ABNORMAL LOW (ref 4.22–5.81)
RDW: 13.8 % (ref 11.5–15.5)
WBC: 11.6 10*3/uL — ABNORMAL HIGH (ref 4.0–10.5)
nRBC: 0 % (ref 0.0–0.2)

## 2020-12-19 LAB — GLUCOSE, CAPILLARY
Glucose-Capillary: 121 mg/dL — ABNORMAL HIGH (ref 70–99)
Glucose-Capillary: 143 mg/dL — ABNORMAL HIGH (ref 70–99)
Glucose-Capillary: 100 mg/dL — ABNORMAL HIGH (ref 70–99)

## 2020-12-19 LAB — BASIC METABOLIC PANEL
Anion gap: 6 (ref 5–15)
BUN: 40 mg/dL — ABNORMAL HIGH (ref 8–23)
CO2: 25 mmol/L (ref 22–32)
Calcium: 8 mg/dL — ABNORMAL LOW (ref 8.9–10.3)
Chloride: 100 mmol/L (ref 98–111)
Creatinine, Ser: 1.88 mg/dL — ABNORMAL HIGH (ref 0.61–1.24)
GFR, Estimated: 34 mL/min — ABNORMAL LOW (ref >60)
Glucose, Bld: 143 mg/dL — ABNORMAL HIGH (ref 70–99)
Potassium: 4.4 mmol/L (ref 3.5–5.1)
Sodium: 131 mmol/L — ABNORMAL LOW (ref 135–145)

## 2020-12-19 MED ORDER — ACETAMINOPHEN 325 MG PO TABS
325.0000 mg | ORAL_TABLET | Freq: Four times a day (QID) | ORAL | Status: AC | PRN
Start: 2020-12-19 — End: ?

## 2020-12-19 MED ORDER — VERAPAMIL HCL ER 240 MG PO CP24: 240.0000 mg | ORAL_CAPSULE | Freq: Every day | ORAL | Status: DC

## 2020-12-19 MED ORDER — ENOXAPARIN SODIUM 30 MG/0.3ML IJ SOSY: 30.0000 mg | PREFILLED_SYRINGE | INTRAMUSCULAR | Status: DC

## 2020-12-19 MED ORDER — HYDROCODONE-ACETAMINOPHEN 5-325 MG PO TABS: 1.0000 | ORAL_TABLET | Freq: Three times a day (TID) | ORAL | 0 refills | Status: DC | PRN

## 2020-12-19 MED ORDER — FUROSEMIDE 20 MG PO TABS: 20.0000 mg | ORAL_TABLET | Freq: Every day | ORAL | Status: DC

## 2020-12-19 MED ORDER — FERROUS SULFATE 325 (65 FE) MG PO TABS: 325.0000 mg | ORAL_TABLET | ORAL | Status: DC

## 2020-12-19 NOTE — TOC Progression Note (Signed)
Transition of Care [facility]) - Progression Note    Patient Details  Name: Kyle Spence MRN: [id number] Date of Birth: 12/01/1932  [facility]) CM/SW Contact  Marlowe Sax, RN Phone Number: 12/19/2020, 1:04 PM  Clinical Narrative:   Spoke with the patient's son and notified him that the patient is going to Room 608B at [facility], He requested EMS transport, the bedside nurse to call report to [facility], EMS called and the patient placed in queu for transport         Expected Discharge Plan and Services           Expected Discharge Date: 12/19/20                                     Social Determinants of Health (SDOH) Interventions    Readmission Risk Interventions No flowsheet data found.

## 2020-12-19 NOTE — Progress Notes (Signed)
Physical Therapy Treatment Patient Details Name: Kyle Spence MRN: [id number] DOB: [birth year] Today's Date: 12/19/2020   History of Present Illness Pt is an 87 yo male s/p fall and L hip hemiarthroplasty, PWB. PMH of HTN, dementia, CKD III.    PT Comments    PT/OT cotreat 2/2 to pt requiring +2 assistance and very limited activity tolerance. Pt is A and oriented however does have some cognition deficits come to light throughout session. No family present to confirm baseline cognition. He continues to require +2 assistance to exit bed and stand 4 x to RW. He does put forth great effort and does relatively well maintaining PWB status. Poor standing posture and poor activity tolerance limits session progressing to OOB to chair. Pt will need extensive PT going forward to assist him to PLOF. Highly recommend DC to SNF for safety and to progress pt to maximal independence with ADLs.     Recommendations for follow up therapy are one component of a multi-disciplinary discharge planning process, led by the attending physician.  Recommendations may be updated based on patient status, additional functional criteria and insurance authorization.  Follow Up Recommendations  Skilled nursing-short term rehab (<3 hours/day)     Assistance Recommended at Discharge Frequent or constant Supervision/Assistance  Equipment Recommendations  Other (comment) (defer to next level of care)       Precautions / Restrictions Precautions Precautions: Fall;Posterior Hip Precaution Booklet Issued: Yes (comment) Restrictions Weight Bearing Restrictions: Yes LLE Weight Bearing: Partial weight bearing LLE Partial Weight Bearing Percentage or Pounds: 50     Mobility  Bed Mobility Overal bed mobility: Needs Assistance Bed Mobility: Supine to Sit;Sit to Supine     Supine to sit: Max assist;+2 for safety/equipment Sit to supine: Max assist;+2 for safety/equipment        Transfers Overall transfer level:  Needs assistance Equipment used: Rolling walker (2 wheels) Transfers: Sit to/from Stand Sit to Stand: Mod assist;From elevated surface;+2 safety/equipment;+2 physical assistance           General transfer comment: Pt continues to require +2 assistance + max vcs for PWB to stand. He did stand 4 x throughout session with prolonged standing tolerance each trial. poor overall standing posture. He did put forth great effort to maintain PWB however.    Ambulation/Gait      General Gait Details: unable/ unsafe at this time    Balance Overall balance assessment: Needs assistance Sitting-balance support: Feet supported Sitting balance-Leahy Scale: Fair     Standing balance support: Reliant on assistive device for balance Standing balance-Leahy Scale: Poor Standing balance comment: +2 assist for all  standing activity       Cognition Arousal/Alertness: Awake/alert Behavior During Therapy: Flat affect Overall Cognitive Status: No family/caregiver present to determine baseline cognitive functioning Area of Impairment: Orientation;Attention;Awareness;Problem solving    General Comments: Pt is alert and able to consistently follow simple one step commands however overall poor awareness of situation. No family members present to confirm baseline cognition.        Exercises Total Joint Exercises Ankle Circles/Pumps: AROM;Both;10 reps Heel Slides: AROM;Right;10 reps;AAROM;Left;Strengthening Hip ABduction/ADduction: AROM;Strengthening;Right;10 reps;AAROM;Left General Exercises - Lower Extremity Gluteal Sets: AROM;10 reps Straight Leg Raises: AAROM;10 reps     Pertinent Vitals/Pain Pain Assessment: 0-10 Pain Score: 4  Pain Location: L hip Pain Descriptors / Indicators: Guarding;Grimacing;Moaning Pain Intervention(s): Limited activity within patient's tolerance;Monitored during session;Premedicated before session;Repositioned     PT Goals (current goals can now be found in the  care plan section)  Acute Rehab PT Goals Patient Stated Goal: to get better Progress towards PT goals: Progressing toward goals    Frequency    7X/week      PT Plan Current plan remains appropriate       AM-PAC PT "6 Clicks" Mobility   Outcome Measure  Help needed turning from your back to your side while in a flat bed without using bedrails?: A Lot Help needed moving from lying on your back to sitting on the side of a flat bed without using bedrails?: A Lot Help needed moving to and from a bed to a chair (including a wheelchair)?: Total Help needed standing up from a chair using your arms (e.g., wheelchair or bedside chair)?: A Lot Help needed to walk in hospital room?: Total Help needed climbing 3-5 steps with a railing? : Total 6 Click Score: 9    End of Session Equipment Utilized During Treatment: Gait belt Activity Tolerance: Patient tolerated treatment well Patient left: in bed;with call bell/phone within reach;with nursing/sitter in room;with family/visitor present;with bed alarm set;with SCD's reapplied Nurse Communication: Mobility status PT Visit Diagnosis: Other abnormalities of gait and mobility (R26.89);Muscle weakness (generalized) (M62.81);Pain;Difficulty in walking, not elsewhere classified (R26.2) Pain - Right/Left: Left Pain - part of body: Hip     Time: 5643-3295 PT Time Calculation (min) (ACUTE ONLY): 24 min  Charges:  $Therapeutic Activity: 8-22 mins                     Jetta Lout PTA 12/19/20, 11:56 AM

## 2020-12-19 NOTE — Progress Notes (Signed)
Subjective:  Patient reports pain as mild to moderate.    Objective:   VITALS:   Vitals:   12/18/20 2132 12/19/20 0402 12/19/20 0415 12/19/20 0729  BP: (!) 116/50 (!) 118/44 (!) 106/43 (!) 125/58  Pulse: 84 71 72 71  Resp: 17 17 16 18   Temp: 98.5 F (36.9 C) 98.1 F (36.7 C) 98.9 F (37.2 C) 98.1 F (36.7 C)  TempSrc:   Oral Oral  SpO2: 95% 97% 97% 99%  Weight:      Height:        PHYSICAL EXAM:  Neurologically intact ABD soft Neurovascular intact Sensation intact distally Intact pulses distally Dorsiflexion/Plantar flexion intact Incision: dressing C/D/I No cellulitis present Compartment soft  LABS  Results for orders placed or performed during the hospital encounter of 12/16/20 (from the past 24 hour(s))  Glucose, capillary     Status: Abnormal   Collection Time: 12/18/20 11:28 AM  Result Value Ref Range   Glucose-Capillary 112 (H) 70 - 99 mg/dL   Comment 1 Notify RN    Comment 2 Document in Chart   Glucose, capillary     Status: Abnormal   Collection Time: 12/18/20  4:46 PM  Result Value Ref Range   Glucose-Capillary 107 (H) 70 - 99 mg/dL   Comment 1 Notify RN    Comment 2 Document in Chart   Glucose, capillary     Status: Abnormal   Collection Time: 12/18/20  9:34 PM  Result Value Ref Range   Glucose-Capillary 162 (H) 70 - 99 mg/dL   Comment 1 Notify RN   CBC     Status: Abnormal   Collection Time: 12/19/20  3:54 AM  Result Value Ref Range   WBC 11.6 (H) 4.0 - 10.5 K/uL   RBC 3.03 (L) 4.22 - 5.81 MIL/uL   Hemoglobin 9.8 (L) 13.0 - 17.0 g/dL   HCT 12/21/20 (L) 29.9 - 37.1 %   MCV 97.4 80.0 - 100.0 fL   MCH 32.3 26.0 - 34.0 pg   MCHC 33.2 30.0 - 36.0 g/dL   RDW 69.6 78.9 - 38.1 %   Platelets 132 (L) 150 - 400 K/uL   nRBC 0.0 0.0 - 0.2 %  Basic metabolic panel     Status: Abnormal   Collection Time: 12/19/20  3:54 AM  Result Value Ref Range   Sodium 131 (L) 135 - 145 mmol/L   Potassium 4.4 3.5 - 5.1 mmol/L   Chloride 100 98 - 111 mmol/L   CO2  25 22 - 32 mmol/L   Glucose, Bld 143 (H) 70 - 99 mg/dL   BUN 40 (H) 8 - 23 mg/dL   Creatinine, Ser 12/21/20 (H) 0.61 - 1.24 mg/dL   Calcium 8.0 (L) 8.9 - 10.3 mg/dL   GFR, Estimated 34 (L) >60 mL/min   Anion gap 6 5 - 15  Glucose, capillary     Status: Abnormal   Collection Time: 12/19/20  8:36 AM  Result Value Ref Range   Glucose-Capillary 121 (H) 70 - 99 mg/dL   Comment 1 Notify RN   Glucose, capillary     Status: Abnormal   Collection Time: 12/19/20 11:10 AM  Result Value Ref Range   Glucose-Capillary 143 (H) 70 - 99 mg/dL   Comment 1 Notify RN     DG Hip Port Unilat With Pelvis 1V Left  Result Date: 12/17/2020 CLINICAL DATA:  Recent left femoral neck fracture, status post left hip hemiarthroplasty. EXAM: DG HIP (WITH OR WITHOUT PELVIS) 2V PORT  LEFT COMPARISON:  12/16/2020 FINDINGS: Left hip hemiarthroplasty in place without periprosthetic fracture or other early complicating feature. Small amount of gas in the adjacent soft tissues, a normal postoperative appearance. Mild spurring of the upper margin of the pubis. No additional significant findings. IMPRESSION: 1. Two views of the left hip demonstrate left hip hemiarthroplasty in place without periprosthetic fracture or acute complicating feature. Electronically Signed   By: Van Clines M.D.   On: 12/17/2020 18:37    Assessment/Plan: 2 Days Post-Op   Principal Problem:   Fracture of femoral neck, left (HCC) Active Problems:   Chronic constipation   Essential (primary) hypertension   Hyperlipidemia   Arthritis, degenerative   Peripheral nerve disease   H/O: gout   Peripheral artery disease (HCC)   Stage 3b chronic kidney disease (CKD) (HCC)   Leukocytosis   Hip fracture (HCC)   Pre-op evaluation   Acute kidney injury superimposed on CKD (HCC)   Impaired fasting glucose   Acute cystitis without hematuria   Mild dementia without behavioral disturbance, psychotic disturbance, mood disturbance, or anxiety   Advance  diet Up with therapy Discharge to SNF WBAT LLE Lovenox 30 daily X 2 weeks Follow up with Dr. Sabra Heck in 2 weeks for staple removal Call office to confirm appointment [place] , PA-C 12/19/2020, 11:14 AM

## 2020-12-19 NOTE — Plan of Care (Signed)
  Problem: Education: Goal: Knowledge of General Education information will improve Description: Including pain rating scale, medication(s)/side effects and non-pharmacologic comfort measures Outcome: Adequate for Discharge   Problem: Health Behavior/Discharge Planning: Goal: Ability to manage health-related needs will improve Outcome: Adequate for Discharge   Problem: Clinical Measurements: Goal: Ability to maintain clinical measurements within normal limits will improve Outcome: Adequate for Discharge Goal: Will remain free from infection Outcome: Adequate for Discharge Goal: Diagnostic test results will improve Outcome: Adequate for Discharge Goal: Respiratory complications will improve Outcome: Adequate for Discharge Goal: Cardiovascular complication will be avoided Outcome: Adequate for Discharge   Problem: Nutrition: Goal: Adequate nutrition will be maintained Outcome: Adequate for Discharge   Problem: Coping: Goal: Level of anxiety will decrease Outcome: Adequate for Discharge   Problem: Elimination: Goal: Will not experience complications related to bowel motility Outcome: Adequate for Discharge Goal: Will not experience complications related to urinary retention Outcome: Adequate for Discharge   Problem: Safety: Goal: Ability to remain free from injury will improve Outcome: Adequate for Discharge   Problem: Skin Integrity: Goal: Risk for impaired skin integrity will decrease Outcome: Adequate for Discharge   

## 2020-12-19 NOTE — Discharge Summary (Addendum)
Physician Discharge Summary  Kyle Spence [id number] DOB: [birth year] DOA: 12/16/2020  PCP: Dion Body, MD  Admit date: 12/16/2020 Discharge date: 12/19/2020  Discharge disposition: SNF   Recommendations for Outpatient Follow-Up:   Follow-up with physician at the nursing home within 3 days of discharge. Follow-up with Dr. Sabra Heck, orthopedic surgeon, in 2 weeks  Discharge Diagnosis:   Principal Problem:   Fracture of femoral neck, left ([place]) Active Problems:   Chronic constipation   Essential (primary) hypertension   Hyperlipidemia   Arthritis, degenerative   Peripheral nerve disease   H/O: gout   Peripheral artery disease (HCC)   Stage 3b chronic kidney disease (CKD) ([place])   Leukocytosis   Hip fracture (HCC)   Pre-op evaluation   Impaired fasting glucose   Acute cystitis without hematuria   Mild dementia without behavioral disturbance, psychotic disturbance, mood disturbance, or anxiety    Discharge Condition: Stable.  Diet recommendation:  Diet Order             Diet - low sodium heart healthy           Diet Carb Modified Fluid consistency: Thin; Room service appropriate? Yes  Diet effective now                     Code Status: Full Code     Hospital Course:   Mr. Kyle Spence is an 87 year old man with medical history significant for hypertension, neuropathy, BPH, CKD stage IIIb, mild dementia, PAD, gout, who presented to the hospital after a mechanical fall at home.  He was found to have left hip fracture.  He underwent left hip hemiarthroplasty on 12/17/2020.    He was given IV fluids for suspected AKI.  However, he has CKD stage IIIb and it appears his creatinine is around his baseline (1.7-1.8).  Decrease in creatinine was likely from overhydration.  AKI was ruled out.  He was treated with empiric IV antibiotics for suspected UTI.  Urine culture showed strep agalactiae. He was evaluated by PT and OT who recommended further  rehabilitation at a skilled nursing facility.    Medical Consultants:   Orthopedic surgeon   Discharge Exam:    Vitals:   12/19/20 0402 12/19/20 0415 12/19/20 0729 12/19/20 1237  BP: (!) 118/44 (!) 106/43 (!) 125/58 (!) 141/54  Pulse: 71 72 71 86  Resp: 17 16 18 18   Temp: 98.1 F (36.7 C) 98.9 F (37.2 C) 98.1 F (36.7 C) 98.4 F (36.9 C)  TempSrc:  Oral Oral Oral  SpO2: 97% 97% 99% 100%  Weight:      Height:         GEN: NAD SKIN: Warm and dry.  Dressing on left hip surgical wound is clean dry and intact.  EYES: EOMI ENT: MMM CV: RRR PULM: CTA B ABD: soft, obese, NT, +BS CNS: AAO x 3, non focal EXT: No edema or tenderness   The results of significant diagnostics from this hospitalization (including imaging, microbiology, ancillary and laboratory) are listed below for reference.     Procedures and Diagnostic Studies:   DG Chest 2 View  Result Date: 12/16/2020 CLINICAL DATA:  Fall, hip pain EXAM: CHEST - 2 VIEW COMPARISON:  06/14/2004 FINDINGS: The heart size and mediastinal contours are within normal limits. Coarsened interstitial markings bilaterally. No lobar consolidation. No pleural effusion or pneumothorax. The visualized skeletal structures are unremarkable. IMPRESSION: 1. No active cardiopulmonary disease. 2. Coarsened interstitial markings bilaterally, which may represent chronic bronchitic type  lung changes. Electronically Signed   By: Davina Poke D.O.   On: 12/16/2020 17:45   DG Lumbar Spine 2-3 Views  Result Date: 12/16/2020 CLINICAL DATA:  [place], weakness for 2 days EXAM: LUMBAR SPINE - 2-3 VIEW COMPARISON:  None. FINDINGS: Frontal and lateral views of the lumbar spine demonstrate 5 non-rib-bearing lumbar type vertebral bodies. There is left convex scoliosis centered at the L4 level. There is grade 1 anterolisthesis of L5 on S1. No acute fractures. Prominent multilevel spondylosis and facet hypertrophy from L2-3 through L5-S1. Sacroiliac joints  appear unremarkable. IMPRESSION: 1. Left convex scoliosis, with extensive multilevel lumbar spondylosis and facet hypertrophy. 2. No acute fracture. Electronically Signed   By: Randa Ngo M.D.   On: 12/16/2020 17:41   CT Head Wo Contrast  Result Date: 12/16/2020 CLINICAL DATA:  Fall, head trauma EXAM: CT HEAD WITHOUT CONTRAST CT CERVICAL SPINE WITHOUT CONTRAST TECHNIQUE: Multidetector CT imaging of the head and cervical spine was performed following the standard protocol without intravenous contrast. Multiplanar CT image reconstructions of the cervical spine were also generated. COMPARISON:  08/16/2019 FINDINGS: CT HEAD FINDINGS Brain: No evidence of acute infarction, hemorrhage, hydrocephalus, extra-axial collection or mass lesion/mass effect. Periventricular and deep white matter hypodensity. Mild global cerebral volume loss. Vascular: No hyperdense vessel or unexpected calcification. Skull: Normal. Negative for fracture or focal lesion. Sinuses/Orbits: No acute finding. Other: None. CT CERVICAL SPINE FINDINGS Alignment: Degenerative straightening of the normal cervical lordosis. Skull base and vertebrae: No acute fracture. No primary bone lesion or focal pathologic process. Soft tissues and spinal canal: No prevertebral fluid or swelling. No visible canal hematoma. Disc levels: Moderate to severe disc space height loss and osteophytosis from C4 through C7. Upper chest: Negative. Other: None. IMPRESSION: 1. No acute intracranial pathology. Small-vessel white matter disease and mild global cerebral volume loss. 2. No fracture or static subluxation of the cervical spine. 3. Moderate to severe disc space height loss and osteophytosis from C4 through C7. Electronically Signed   By: Delanna Ahmadi M.D.   On: 12/16/2020 17:01   CT Cervical Spine Wo Contrast  Result Date: 12/16/2020 CLINICAL DATA:  Fall, head trauma EXAM: CT HEAD WITHOUT CONTRAST CT CERVICAL SPINE WITHOUT CONTRAST TECHNIQUE: Multidetector CT  imaging of the head and cervical spine was performed following the standard protocol without intravenous contrast. Multiplanar CT image reconstructions of the cervical spine were also generated. COMPARISON:  08/16/2019 FINDINGS: CT HEAD FINDINGS Brain: No evidence of acute infarction, hemorrhage, hydrocephalus, extra-axial collection or mass lesion/mass effect. Periventricular and deep white matter hypodensity. Mild global cerebral volume loss. Vascular: No hyperdense vessel or unexpected calcification. Skull: Normal. Negative for fracture or focal lesion. Sinuses/Orbits: No acute finding. Other: None. CT CERVICAL SPINE FINDINGS Alignment: Degenerative straightening of the normal cervical lordosis. Skull base and vertebrae: No acute fracture. No primary bone lesion or focal pathologic process. Soft tissues and spinal canal: No prevertebral fluid or swelling. No visible canal hematoma. Disc levels: Moderate to severe disc space height loss and osteophytosis from C4 through C7. Upper chest: Negative. Other: None. IMPRESSION: 1. No acute intracranial pathology. Small-vessel white matter disease and mild global cerebral volume loss. 2. No fracture or static subluxation of the cervical spine. 3. Moderate to severe disc space height loss and osteophytosis from C4 through C7. Electronically Signed   By: Delanna Ahmadi M.D.   On: 12/16/2020 17:01   DG Hip Port Unilat With Pelvis 1V Left  Result Date: 12/17/2020 CLINICAL DATA:  Recent left femoral neck fracture,  status post left hip hemiarthroplasty. EXAM: DG HIP (WITH OR WITHOUT PELVIS) 2V PORT LEFT COMPARISON:  12/16/2020 FINDINGS: Left hip hemiarthroplasty in place without periprosthetic fracture or other early complicating feature. Small amount of gas in the adjacent soft tissues, a normal postoperative appearance. Mild spurring of the upper margin of the pubis. No additional significant findings. IMPRESSION: 1. Two views of the left hip demonstrate left hip  hemiarthroplasty in place without periprosthetic fracture or acute complicating feature. Electronically Signed   By: Van Clines M.D.   On: 12/17/2020 18:37   DG Hip Unilat W or Wo Pelvis 2-3 Views Left  Result Date: 12/16/2020 CLINICAL DATA:  [place], decreased movement EXAM: DG HIP (WITH OR WITHOUT PELVIS) 2-3V LEFT; DG HIP (WITH OR WITHOUT PELVIS) 2-3V RIGHT COMPARISON:  None. FINDINGS: Frontal view of the pelvis as well as frontal and frogleg lateral views of both hips are obtained. Left hip: There is a basicervical left femoral neck fracture, with mild impaction at the fracture site. No dislocation. Visualized portions of the left hemipelvis are unremarkable. Right hip: No fracture, subluxation, or dislocation. Joint spaces are well preserved. Visualized portions of the right hemipelvis are unremarkable. IMPRESSION: 1. Basicervical left femoral neck fracture with mild impaction at the fracture site. 2. Unremarkable right hip. Electronically Signed   By: Randa Ngo M.D.   On: 12/16/2020 17:42   DG HIP UNILAT WITH PELVIS 2-3 VIEWS RIGHT  Result Date: 12/16/2020 CLINICAL DATA:  [place], decreased movement EXAM: DG HIP (WITH OR WITHOUT PELVIS) 2-3V LEFT; DG HIP (WITH OR WITHOUT PELVIS) 2-3V RIGHT COMPARISON:  None. FINDINGS: Frontal view of the pelvis as well as frontal and frogleg lateral views of both hips are obtained. Left hip: There is a basicervical left femoral neck fracture, with mild impaction at the fracture site. No dislocation. Visualized portions of the left hemipelvis are unremarkable. Right hip: No fracture, subluxation, or dislocation. Joint spaces are well preserved. Visualized portions of the right hemipelvis are unremarkable. IMPRESSION: 1. Basicervical left femoral neck fracture with mild impaction at the fracture site. 2. Unremarkable right hip. Electronically Signed   By: Randa Ngo M.D.   On: 12/16/2020 17:42     Labs:   Basic Metabolic Panel: Recent Labs  Lab  12/16/20 1606 12/17/20 0651 12/18/20 0309 12/19/20 0354  NA 134* 138 135 131*  K 4.8 4.5 4.5 4.4  CL 100 102 103 100  CO2 26 28 26 25   GLUCOSE 292* 139* 120* 143*  BUN 39* 28* 32* 40*  CREATININE 1.77* 1.46* 1.72* 1.88*  CALCIUM 9.4 9.1 8.1* 8.0*   GFR Estimated Creatinine Clearance: 32.2 mL/min (A) (by C-G formula based on SCr of 1.88 mg/dL (H)). Liver Function Tests: No results for input(s): AST, ALT, ALKPHOS, BILITOT, PROT, ALBUMIN in the last 168 hours. No results for input(s): LIPASE, AMYLASE in the last 168 hours. No results for input(s): AMMONIA in the last 168 hours. Coagulation profile Recent Labs  Lab 12/16/20 2316  INR 1.1    CBC: Recent Labs  Lab 12/16/20 1606 12/17/20 0651 12/18/20 0309 12/19/20 0354  WBC 15.5* 10.9* 10.7* 11.6*  HGB 14.0 13.3 10.0* 9.8*  HCT 42.4 40.2 30.6* 29.5*  MCV 97.2 95.3 97.5 97.4  PLT 217 189 146* 132*   Cardiac Enzymes: Recent Labs  Lab 12/16/20 1606  CKTOTAL 136   BNP: Invalid input(s): POCBNP CBG: Recent Labs  Lab 12/18/20 1128 12/18/20 1646 12/18/20 2134 12/19/20 0836 12/19/20 1110  GLUCAP 112* 107* 162* 121* 143*  D-Dimer No results for input(s): DDIMER in the last 72 hours. Hgb A1c Recent Labs    12/16/20 2316  HGBA1C 5.6   Lipid Profile No results for input(s): CHOL, HDL, LDLCALC, TRIG, CHOLHDL, LDLDIRECT in the last 72 hours. Thyroid function studies No results for input(s): TSH, T4TOTAL, T3FREE, THYROIDAB in the last 72 hours.  Invalid input(s): FREET3 Anemia work up Recent Labs    12/16/20 2315  VITAMINB12 787   Microbiology Recent Results (from the past 240 hour(s))  Resp Panel by RT-PCR (Flu A&B, Covid) Nasopharyngeal Swab     Status: None   Collection Time: 12/16/20  8:25 PM   Specimen: Nasopharyngeal Swab; Nasopharyngeal(NP) swabs in vial transport medium  Result Value Ref Range Status   SARS Coronavirus 2 by RT PCR NEGATIVE NEGATIVE Final    Comment: (NOTE) SARS-CoV-2 target  nucleic acids are NOT DETECTED.  The SARS-CoV-2 RNA is generally detectable in upper respiratory specimens during the acute phase of infection. The lowest concentration of SARS-CoV-2 viral copies this assay can detect is 138 copies/mL. A negative result does not preclude SARS-Cov-2 infection and should not be used as the sole basis for treatment or other patient management decisions. A negative result may occur with  improper specimen collection/handling, submission of specimen other than nasopharyngeal swab, presence of viral mutation(s) within the areas targeted by this assay, and inadequate number of viral copies(<138 copies/mL). A negative result must be combined with clinical observations, patient history, and epidemiological information. The expected result is Negative.  Fact Sheet for Patients:  BloggerCourse.com  Fact Sheet for Healthcare Providers:  SeriousBroker.it  This test is no t yet approved or cleared by the [place] FDA and  has been authorized for detection and/or diagnosis of SARS-CoV-2 by FDA under an Emergency Use Authorization (EUA). This EUA will remain  in effect (meaning this test can be used) for the duration of the COVID-19 declaration under Section 564(b)(1) of the Act, 21 U.S.C.section 360bbb-3(b)(1), unless the authorization is terminated  or revoked sooner.       Influenza A by PCR NEGATIVE NEGATIVE Final   Influenza B by PCR NEGATIVE NEGATIVE Final    Comment: (NOTE) The Xpert Xpress SARS-CoV-2/FLU/RSV plus assay is intended as an aid in the diagnosis of influenza from Nasopharyngeal swab specimens and should not be used as a sole basis for treatment. Nasal washings and aspirates are unacceptable for Xpert Xpress SARS-CoV-2/FLU/RSV testing.  Fact Sheet for Patients: BloggerCourse.com  Fact Sheet for Healthcare  Providers: SeriousBroker.it  This test is not yet approved or cleared by the [place] FDA and has been authorized for detection and/or diagnosis of SARS-CoV-2 by FDA under an Emergency Use Authorization (EUA). This EUA will remain in effect (meaning this test can be used) for the duration of the COVID-19 declaration under Section 564(b)(1) of the Act, 21 U.S.C. section 360bbb-3(b)(1), unless the authorization is terminated or revoked.  Performed at [facility], [address], [place], [place] [address]   Urine Culture     Status: Abnormal   Collection Time: 12/16/20  8:25 PM   Specimen: Urine, Random  Result Value Ref Range Status   Specimen Description   Final    URINE, RANDOM Performed at [facility], [address], [place], [place] [address]    Special Requests   Final    NONE Performed at [facility], [address] [place], [place] [address]    Culture (A)  Final    >=100,000 COLONIES/mL GROUP  B STREP(S.AGALACTIAE)ISOLATED Standardized susceptibility testing for this organism is not available. Performed at [facility], [place] [address] [place], [place] [address]    Report Status 12/18/2020 FINAL  Final  Urine Culture     Status: Abnormal   Collection Time: 12/17/20  3:30 PM   Specimen: Urine, Catheterized  Result Value Ref Range Status   Specimen Description   Final    URINE, CATHETERIZED Performed at [facility], [address] [place], [place] [address]    Special Requests   Final    NONE Performed at [facility], [address] [place], [place] [address]    Culture (A)  Final    >=100,000 COLONIES/mL STREPTOCOCCUS AGALACTIAE TESTING AGAINST S. AGALACTIAE NOT ROUTINELY PERFORMED DUE TO PREDICTABILITY OF AMP/PEN/VAN SUSCEPTIBILITY. Performed at [facility], [place] [address] [place], [place] [address]    Report Status 12/18/2020 FINAL  Final     Discharge  Instructions:   Discharge Instructions     Diet - low sodium heart healthy   Complete by: As directed    Discharge wound care:   Complete by: As directed    Keep perineal area clean and dry.  Orthopedic surgeon will evaluate left hip surgical wound as an outpatient   Increase activity slowly   Complete by: As directed       Allergies as of 12/19/2020       Reactions   Enalapril Other (See Comments)   HYPERKALEMIA    Penicillins Swelling        Medication List     TAKE these medications    acetaminophen 325 MG tablet Commonly known as: TYLENOL Take 1-2 tablets (325-650 mg total) by mouth every 6 (six) hours as needed for mild pain (pain score 1-3 or temp > 100.5).   allopurinol 100 MG tablet Commonly known as: ZYLOPRIM Take 100 mg by mouth daily.   aspirin EC 81 MG tablet Take 81 mg by mouth daily.   atorvastatin 40 MG tablet Commonly known as: LIPITOR Take 40 mg by mouth daily.   docusate sodium 50 MG capsule Commonly known as: COLACE Take by mouth.   enalapril 20 MG tablet Commonly known as: VASOTEC   enoxaparin 30 MG/0.3ML injection Commonly known as: LOVENOX Inject 0.3 mLs (30 mg total) into the skin daily for 14 days. Start taking on: December 20, 2020   ferrous sulfate 325 (65 FE) MG tablet Take 1 tablet (325 mg total) by mouth every other day.   furosemide 20 MG tablet Commonly known as: LASIX Take 1 tablet (20 mg total) by mouth daily. Start taking on: December 20, 2020 What changed: when to take this   glucosamine-chondroitin 500-400 MG tablet Take 1 tablet by mouth 3 (three) times daily.   Glycerin-Hypromellose-PEG 400 0.2-0.2-1 % Soln Apply to eye.   HYDROcodone-acetaminophen 5-325 MG tablet Commonly known as: NORCO/VICODIN Take 1 tablet by mouth every 8 (eight) hours as needed for moderate pain.   mirabegron ER 25 MG Tb24 tablet Commonly known as: MYRBETRIQ Take 1 tablet (25 mg total) by mouth daily.   Multi-Vitamins Tabs Take  1 tablet by mouth daily.   Prostate 2.4 Caps Take by mouth.   tamsulosin 0.4 MG Caps capsule Commonly known as: FLOMAX Take 0.4 mg by mouth daily.   verapamil 240 MG 24 hr capsule Commonly known as: VERELAN PM Take 1 capsule (240 mg total) by mouth daily. What changed: how to take this  Discharge Care Instructions  (From admission, onward)           Start     Ordered   12/19/20 0000  Discharge wound care:       Comments: Keep perineal area clean and dry.  Orthopedic surgeon will evaluate left hip surgical wound as an outpatient   12/19/20 1237            Contact information for after-discharge care     Destination     [facility]erred [facility] .   Service: Skilled Nursing Contact information: [address] [place] [phone]                       If you experience worsening of your admission symptoms, develop shortness of breath, life threatening emergency, suicidal or homicidal thoughts you must seek medical attention immediately by calling 911 or calling your MD immediately  if symptoms less severe.   You must read complete instructions/literature along with all the possible adverse reactions/side effects for all the medicines you take and that have been prescribed to you. Take any new medicines after you have completely understood and accept all the possible adverse reactions/side effects.    Please note   You were cared for by a hospitalist during your hospital stay. If you have any questions about your discharge medications or the care you received while you were in the hospital after you are discharged, you can call the unit and asked to speak with the hospitalist on call if the hospitalist that took care of you is not available. Once you are discharged, your primary care physician will handle any further medical issues. Please note that NO REFILLS for any discharge medications will  be authorized once you are discharged, as it is imperative that you return to your primary care physician (or establish a relationship with a primary care physician if you do not have one) for your aftercare needs so that they can reassess your need for medications and monitor your lab values.       Time coordinating discharge: 32 minutes  Signed:  Tangia Pinard  [facility] 12/19/2020, 12:50 PM   Pager on [email or web address]. If 7PM-7AM, please contact night-coverage at [email or web address]

## 2020-12-19 NOTE — Care Management Important Message (Signed)
Important Message  Patient Details  Name: Kyle Spence MRN: [id number] Date of Birth: [birth year]   Medicare Important Message Given:  N/A - LOS <3 / Initial given by admissions     [name] A [name] 12/19/2020, 8:41 AM

## 2020-12-19 NOTE — Evaluation (Signed)
Occupational Therapy Evaluation Patient Details Name: Kyle Spence MRN: [id number] DOB: 12-21-1932 Today's Date: 12/19/2020   History of Present Illness Pt is an 87 yo male s/p fall and L hip hemiarthroplasty, PWB. PMH of HTN, dementia, CKD III.   Clinical Impression   Pt seen for OT evaluation and co-tx with PT this date. Pt was mod independent in basic ADL prior to surgery, amb w/ RW, and received assist from son for groceries, Meals on Wheels. Pt unable to recall precautions or WBing restrictions. Pt currently requires MAX A +2 for bed mobility, standing attempts x4 EOB, +3 for standing toileting/pericare/linens change, with VC for PWBing LLE and ultimately unable to achieve full upright posture with forward flexed position contributing to posterior lean, while also fearful of falling. Pt would benefit from additional instruction in self care skills and techniques to help maintain precautions with assistive devices to support recall and carryover prior to discharge. Recommend SNF upon discharge.     Recommendations for follow up therapy are one component of a multi-disciplinary discharge planning process, led by the attending physician.  Recommendations may be updated based on patient status, additional functional criteria and insurance authorization.   Follow Up Recommendations  Skilled nursing-short term rehab (<3 hours/day)    Assistance Recommended at Discharge Frequent or constant Supervision/Assistance  Functional Status Assessment  Patient has had a recent decline in their functional status and demonstrates the ability to make significant improvements in function in a reasonable and predictable amount of time.  Equipment Recommendations  BSC/3in1;Other (comment) (hip kit)    Recommendations for Other Services       Precautions / Restrictions Precautions Precautions: Fall;Posterior Hip Precaution Booklet Issued: Yes (comment) Restrictions Weight Bearing Restrictions:  Yes LLE Weight Bearing: Partial weight bearing LLE Partial Weight Bearing Percentage or Pounds: 50      Mobility Bed Mobility Overal bed mobility: Needs Assistance Bed Mobility: Supine to Sit;Sit to Supine     Supine to sit: Max assist;+2 for safety/equipment Sit to supine: Max assist;+2 for safety/equipment   General bed mobility comments: VC for technique to maintain posterior THPs    Transfers Overall transfer level: Needs assistance Equipment used: Rolling walker (2 wheels) Transfers: Sit to/from Stand Sit to Stand: Mod assist;From elevated surface;+2 safety/equipment;+2 physical assistance           General transfer comment: Pt continues to require +2 assistance + max vcs for PWB to stand. He did stand 4 x throughout session with prolonged standing tolerance each trial. poor overall standing posture. He did put forth great effort to maintain PWB however.      Balance Overall balance assessment: Needs assistance Sitting-balance support: Feet supported;Single extremity supported;No upper extremity supported Sitting balance-Leahy Scale: Fair Sitting balance - Comments: initially reliant on UE, as confidence improved able to maintain with CGA   Standing balance support: Reliant on assistive device for balance;Bilateral upper extremity supported Standing balance-Leahy Scale: Poor Standing balance comment: +2 assist for all  standing activity, fearful of falling, unable to achieve full upright posture                           ADL either performed or assessed with clinical judgement   ADL Overall ADL's : Needs assistance/impaired  General ADL Comments: Pt currently requires MAX A +2 for LB bathing, dressing, and +2-3 for toileting in standing (+2 for stand, 3rd person for pericare) 2/2 impairments; MIN A for seated UB ADL     Vision         Perception     Praxis      Pertinent Vitals/Pain Pain  Assessment: 0-10 Pain Score: 4  Pain Location: L hip Pain Descriptors / Indicators: Guarding;Grimacing;Moaning Pain Intervention(s): Limited activity within patient's tolerance;Monitored during session;Premedicated before session;Repositioned     Hand Dominance Right   Extremity/Trunk Assessment Upper Extremity Assessment Upper Extremity Assessment: Generalized weakness   Lower Extremity Assessment Lower Extremity Assessment: Generalized weakness;LLE deficits/detail LLE Deficits / Details: post-op strength/ROM deficits   Cervical / Trunk Assessment Cervical / Trunk Assessment: Kyphotic   Communication Communication Communication: No difficulties   Cognition Arousal/Alertness: Awake/alert Behavior During Therapy: Flat affect Overall Cognitive Status: No family/caregiver present to determine baseline cognitive functioning Area of Impairment: Orientation;Attention;Awareness;Problem solving                               General Comments: Pt is alert and able to consistently follow simple one step commands however overall poor awareness of situation. No family members present to confirm baseline cognition.     General Comments       Exercises Other Exercises: Pt instructed in posterior THPs, PWBing through LLE, ADL transfer training, bed mobility training   Shoulder Instructions      Home Living Family/patient expects to be discharged to:: Private residence Living Arrangements: Alone Available Help at Discharge: Family;Available PRN/intermittently;Other (Comment) (has a son that checks in every day at 5pm) Type of Home: House Home Access: Ramped entrance     [place]: One level     Bathroom Shower/Tub: Teacher, early years/pre: Standard Bathroom Accessibility: Yes   Home Equipment: Conservation officer, nature (2 wheels);Cane - single point;Grab bars - tub/shower;Shower seat (adjustable bed)   Additional Comments: lift recliner      Prior  Functioning/Environment Prior Level of Function : Needs assist             Mobility Comments: uses RW at baseline. ADLs Comments: son is over daily to assist with ADLs/IADLs as needed. has meals on wheels, able to use microwave for light meal prep        OT Problem List: Decreased strength;Pain;Decreased cognition;Decreased range of motion;Decreased activity tolerance;Decreased safety awareness;Decreased knowledge of use of DME or AE;Impaired balance (sitting and/or standing);Decreased knowledge of precautions      OT Treatment/Interventions: Self-care/ADL training;Therapeutic exercise;Therapeutic activities;DME and/or AE instruction;Patient/family education;Balance training    OT Goals(Current goals can be found in the care plan section) Acute Rehab OT Goals Patient Stated Goal: "get out of this mess" OT Goal Formulation: With patient Time For Goal Achievement: 01/02/21 Potential to Achieve Goals: Good ADL Goals Pt Will Perform Lower Body Dressing: with mod assist;sit to/from stand;with adaptive equipment (maintaining [place], posterior THPs) Pt Will Transfer to Toilet: with mod assist;bedside commode;stand pivot transfer (LRAD, maintaining [place], posterior THPs) Additional ADL Goal #1: Pt will verbalize 100% of L hip/PWBing precautions and require only MIN VC to maintain during ADL/mobility.  OT Frequency: Min 2X/week   Barriers to D/C:            Co-evaluation PT/OT/SLP Co-Evaluation/Treatment: Yes Reason for Co-Treatment: For patient/therapist safety;To address functional/ADL transfers PT goals addressed during session: Mobility/safety with mobility;Balance;Proper use of DME OT  goals addressed during session: Proper use of Adaptive equipment and DME;ADL's and self-care      AM-PAC OT "6 Clicks" Daily Activity     Outcome Measure Help from another person eating meals?: None Help from another person taking care of personal grooming?: A Little Help from another person  toileting, which includes using toliet, bedpan, or urinal?: Total Help from another person bathing (including washing, rinsing, drying)?: A Lot Help from another person to put on and taking off regular upper body clothing?: A Little Help from another person to put on and taking off regular lower body clothing?: A Lot 6 Click Score: 15   End of Session Equipment Utilized During Treatment: Rolling walker (2 wheels)  Activity Tolerance: Patient tolerated treatment well Patient left: in bed;with call bell/phone within reach;Other (comment) (with PT for additional session)  OT Visit Diagnosis: Other abnormalities of gait and mobility (R26.89);Muscle weakness (generalized) (M62.81);Pain Pain - Right/Left: Left Pain - part of body: Hip                Time: 1460-4799 OT Time Calculation (min): 25 min Charges:  OT General Charges $OT Visit: 1 Visit OT Evaluation $OT Eval High Complexity: 1 High OT Treatments $Self Care/Home Management : 8-22 mins  Ardeth Perfect., MPH, MS, OTR/L ascom [phone] 12/19/20, 12:41 PM

## 2020-12-24 NOTE — Anesthesia Postprocedure Evaluation (Signed)
Anesthesia Post Note  Patient: Kyle Spence  Procedure(s) Performed: ARTHROPLASTY BIPOLAR HIP (HEMIARTHROPLASTY) (Left: Hip)  Patient location during evaluation: PACU Anesthesia Type: Spinal Level of consciousness: awake and alert Pain management: pain level controlled Vital Signs Assessment: post-procedure vital signs reviewed and stable Respiratory status: spontaneous breathing, nonlabored ventilation, respiratory function stable and patient connected to nasal cannula oxygen Cardiovascular status: blood pressure returned to baseline and stable Postop Assessment: no apparent nausea or vomiting Anesthetic complications: no   No notable events documented.   Last Vitals:  Vitals:   12/19/20 0729 12/19/20 1237  BP: (!) 125/58 (!) 141/54  Pulse: 71 86  Resp: 18 18  Temp: 36.7 C 36.9 C  SpO2: 99% 100%    Last Pain:  Vitals:   12/19/20 1700  TempSrc:   PainSc: 5                  Yevette Edwards

## 2021-01-08 ENCOUNTER — Encounter: Payer: Medicare Other | Attending: Physician Assistant | Admitting: Physician Assistant

## 2021-01-08 ENCOUNTER — Other Ambulatory Visit: Payer: Self-pay

## 2021-01-08 DIAGNOSIS — N183 Chronic kidney disease, stage 3 unspecified: Secondary | ICD-10-CM | POA: Insufficient documentation

## 2021-01-08 DIAGNOSIS — L89150 Pressure ulcer of sacral region, unstageable: Secondary | ICD-10-CM | POA: Diagnosis not present

## 2021-01-08 DIAGNOSIS — I129 Hypertensive chronic kidney disease with stage 1 through stage 4 chronic kidney disease, or unspecified chronic kidney disease: Secondary | ICD-10-CM | POA: Diagnosis not present

## 2021-01-09 NOTE — Progress Notes (Signed)
[name], [name] ([id number]) Visit Report for 01/08/2021 Allergy List Details Patient Name: Kyle Spence, Kyle Spence. Date of Service: 01/08/2021 8:45 AM Medical Record Number: [id number] Patient Account Number: [id number] Date of Birth/Sex: Jun 08, 1932 (87 y.o. M) Treating RN: Yevonne Pax Primary Care Arieanna Pressey: Marisue Ivan Other Clinician: Referring [name]: Dennison Bulla Treating Ayala Ribble/Extender: Allen Derry Weeks in Treatment: 0 Allergies Active Allergies enalapril penicillin Allergy Notes Electronic Signature(s) Signed: 01/09/2021 4:05:54 PM By: Yevonne Pax RN Entered By: Yevonne Pax on 01/08/2021 09:10:12 Kyle Spence ([id number]) -------------------------------------------------------------------------------- Arrival Information Details Patient Name: Kyle Spence Date of Service: 01/08/2021 8:45 AM Medical Record Number: [id number] Patient Account Number: [id number] Date of Birth/Sex: Jun 16, 1932 (87 y.o. M) Treating RN: Yevonne Pax Primary Care Kelvin Sennett: Marisue Ivan Other Clinician: Referring Marshawn Ninneman: Dennison Bulla Treating Yemariam Magar/Extender: Rowan Blase in Treatment: 0 Visit Information Patient Arrived: Wheel Chair Arrival Time: 09:07 Accompanied By: son Transfer Assistance: None Patient Identification Verified: Yes Secondary Verification Process Completed: Yes Patient Requires Transmission-Based Precautions: No Patient Has Alerts: No Electronic Signature(s) Signed: 01/09/2021 4:05:54 PM By: Yevonne Pax RN Entered By: Yevonne Pax on 01/08/2021 09:07:56 Kyle Spence, Kyle Spence ([id number]) -------------------------------------------------------------------------------- Clinic Level of Care Assessment Details Patient Name: Kyle Spence. Date of Service: 01/08/2021 8:45 AM Medical Record Number: [id number] Patient Account Number: [id number] Date of Birth/Sex: 1932/09/08 (87 y.o. M) Treating RN: Yevonne Pax Primary Care Helios Kohlmann:  Marisue Ivan Other Clinician: Referring Norrin Shreffler: Dennison Bulla Treating Ciarah Peace/Extender: Rowan Blase in Treatment: 0 Clinic Level of Care Assessment Items TOOL 1 Quantity Score X - Use when EandM and Procedure is performed on INITIAL visit 1 0 ASSESSMENTS - Nursing Assessment / Reassessment X - General Physical Exam (combine w/ comprehensive assessment (listed just below) when performed on new 1 20 pt. evals) X- 1 25 Comprehensive Assessment (HX, ROS, Risk Assessments, Wounds Hx, etc.) ASSESSMENTS - Wound and Skin Assessment / Reassessment  - Dermatologic / Skin Assessment (not related to wound area) 0 ASSESSMENTS - Ostomy and/or Continence Assessment and Care  - Incontinence Assessment and Management 0  - 0 Ostomy Care Assessment and Management (repouching, etc.) PROCESS - Coordination of Care X - Simple Patient / Family Education for ongoing care 1 15  - 0 Complex (extensive) Patient / Family Education for ongoing care X- 1 10 Staff obtains Chiropractor, Records, Test Results / Process Orders  - 0 Staff telephones HHA, Nursing Homes / Clarify orders / etc  - 0 Routine Transfer to another Facility (non-emergent condition)  - 0 Routine Hospital Admission (non-emergent condition) X- 1 15 New Admissions / Manufacturing engineer / Ordering NPWT, Apligraf, etc.  - 0 Emergency Hospital Admission (emergent condition) PROCESS - Special Needs  - Pediatric / Minor Patient Management 0  - 0 Isolation Patient Management  - 0 Hearing / Language / Visual special needs  - 0 Assessment of Community assistance (transportation, D/C planning, etc.)  - 0 Additional assistance / Altered mentation  - 0 Support Surface(s) Assessment (bed, cushion, seat, etc.) INTERVENTIONS - Miscellaneous  - External ear exam 0  - 0 Patient Transfer (multiple staff / Nurse, adult / Similar devices)  - 0 Simple Staple / Suture removal (25 or less)  -  0 Complex Staple / Suture removal (26 or more)  - 0 Hypo/Hyperglycemic Management (do not check if billed separately)  - 0 Ankle / Brachial Index (ABI) - do not check if billed separately Has the patient been seen at the hospital within the last three years: Yes Total Score: 85 Level Of Care:  New/Established - Level 3 Kyle Spence, Kyle Spence ([id number]) Electronic Signature(s) Signed: 01/09/2021 4:05:54 PM By: Yevonne Pax RN Entered By: Yevonne Pax on 01/08/2021 09:58:06 Kyle Spence ([id number]) -------------------------------------------------------------------------------- Encounter Discharge Information Details Patient Name: Kyle Spence Date of Service: 01/08/2021 8:45 AM Medical Record Number: [id number] Patient Account Number: [id number] Date of Birth/Sex: 08/16/1932 (87 y.o. M) Treating RN: Yevonne Pax Primary Care Zylie Mumaw: Marisue Ivan Other Clinician: Referring Nthony Lefferts: Dennison Bulla Treating Catcher Dehoyos/Extender: Rowan Blase in Treatment: 0 Encounter Discharge Information Items Post Procedure Vitals Discharge Condition: Stable Temperature (F): 98.2 Ambulatory Status: Wheelchair Pulse (bpm): 76 Discharge Destination: Home Respiratory Rate (breaths/min): 18 Transportation: Private Auto Blood Pressure (mmHg): 147/76 Accompanied ByShari Heritage Schedule Follow-up Appointment: Yes Clinical Summary of Care: Patient Declined Electronic Signature(s) Signed: 01/09/2021 4:05:54 PM By: Yevonne Pax RN Entered By: Yevonne Pax on 01/08/2021 09:59:24 Kyle Spence ([id number]) -------------------------------------------------------------------------------- Lower Extremity Assessment Details Patient Name: Kyle Spence. Date of Service: 01/08/2021 8:45 AM Medical Record Number: [id number] Patient Account Number: [id number] Date of Birth/Sex: [birth year] (87 y.o. M) Treating RN: Yevonne Pax Primary Care Anshika Pethtel: Marisue Ivan Other  Clinician: Referring Cj Beecher: Dennison Bulla Treating Earlyn Sylvan/Extender: Rowan Blase in Treatment: 0 Electronic Signature(s) Signed: 01/09/2021 4:05:54 PM By: Yevonne Pax RN Entered By: Yevonne Pax on 01/08/2021 09:16:39 Kyle Spence ([id number]) -------------------------------------------------------------------------------- Multi Wound Chart Details Patient Name: Kyle Spence. Date of Service: 01/08/2021 8:45 AM Medical Record Number: [id number] Patient Account Number: [id number] Date of Birth/Sex: [birth year] (87 y.o. M) Treating RN: Yevonne Pax Primary Care Avi Archuleta: Marisue Ivan Other Clinician: Referring Juliett Eastburn: Dennison Bulla Treating Taffy Delconte/Extender: Rowan Blase in Treatment: 0 Vital Signs Height(in): 70 Pulse(bpm): 76 Weight(lbs): 217 Blood Pressure(mmHg): 147/76 Body Mass Index(BMI): 31 Temperature(F): 98.2 Respiratory Rate(breaths/min): 18 Photos: [1:No Photos] [N/A:N/A] Wound Location: [1:Coccyx] [N/A:N/A] Wounding Event: [1:Gradually Appeared] [N/A:N/A] Primary Etiology: [1:Pressure Ulcer] [N/A:N/A] Comorbid History: [1:Hypertension] [N/A:N/A] Date Acquired: [1:12/23/2020] [N/A:N/A] Weeks of Treatment: [1:0] [N/A:N/A] Wound Status: [1:Open] [N/A:N/A] Measurements L x W x D (cm) [1:5x7x0.1] [N/A:N/A] Area (cm) : [1:27.489] [N/A:N/A] Volume (cm) : [1:2.749] [N/A:N/A] Classification: [1:Unstageable/Unclassified] [N/A:N/A] Exudate Amount: [1:Medium] [N/A:N/A] Exudate Type: [1:Serosanguineous] [N/A:N/A] Exudate Color: [1:red, brown] [N/A:N/A] Necrotic Amount: [1:N/A] [N/A:N/A] Exposed Structures: [1:Fat Layer (Subcutaneous Tissue): Yes Fascia: No Tendon: No Muscle: No Joint: No Bone: No None] [N/A:N/A N/A] Treatment Notes Electronic Signature(s) Signed: 01/09/2021 4:05:54 PM By: Yevonne Pax RN Entered By: Yevonne Pax on 01/08/2021 09:49:06 Kyle Spence  ([id number]) -------------------------------------------------------------------------------- Multi-Disciplinary Care Plan Details Patient Name: Kyle Spence. Date of Service: 01/08/2021 8:45 AM Medical Record Number: [id number] Patient Account Number: [id number] Date of Birth/Sex: [birth year] (87 y.o. M) Treating RN: Yevonne Pax Primary Care Manu Rubey: Marisue Ivan Other Clinician: Referring Tacoya Altizer: Dennison Bulla Treating Sydell Prowell/Extender: Rowan Blase in Treatment: 0 Active Inactive Abuse / Safety / Falls / Self Care Management Nursing Diagnoses: Potential for injury related to falls Potential for injury related to transfers Self care deficit: actual or potential Goals: Patient/caregiver will verbalize understanding of skin care regimen Date Initiated: 01/08/2021 Target Resolution Date: 02/08/2021 Goal Status: Active Patient/caregiver will verbalize understanding of the importance to maintain current immunizations/vaccinations Date Initiated: 01/08/2021 Target Resolution Date: 02/08/2021 Goal Status: Active Patient/caregiver will verbalize/demonstrate measure taken to improve self care Date Initiated: 01/08/2021 Target Resolution Date: 03/11/2021 Goal Status: Active Patient/caregiver will verbalize/demonstrate measures taken to improve the patient's personal safety Date Initiated: 01/08/2021 Target Resolution Date: 04/08/2021 Goal Status: Active Patient/caregiver will verbalize/demonstrate measures taken to prevent injury and/or falls Date Initiated: 01/08/2021 Target Resolution Date: 05/09/2021  Goal Status: Active Patient/caregiver will verbalize/demonstrate understanding of what to do in case of emergency Date Initiated: 01/08/2021 Target Resolution Date: 06/08/2021 Goal Status: Active Interventions: Assess fall risk on admission and as needed Assess: immobility, friction, shearing, incontinence upon admission and as needed Assess impairment of mobility on  admission and as needed per policy Assess personal safety and home safety (as indicated) on admission and as needed Assess self care needs on admission and as needed Patient/Caregiver referred to community resources (specify in notes) Notes: Wound/Skin Impairment Nursing Diagnoses: Knowledge deficit related to ulceration/compromised skin integrity Goals: Patient/caregiver will verbalize understanding of skin care regimen Date Initiated: 01/08/2021 Target Resolution Date: 02/08/2021 Goal Status: Active Ulcer/skin breakdown will have a volume reduction of 30% by week 4 Date Initiated: 01/08/2021 Target Resolution Date: 03/11/2021 Goal Status: Active Ulcer/skin breakdown will have a volume reduction of 50% by week 8 Date Initiated: 01/08/2021 Target Resolution Date: 04/08/2021 Goal Status: Active Kyle Spence, Kyle Spence ([id number]) Ulcer/skin breakdown will have a volume reduction of 80% by week 12 Date Initiated: 01/08/2021 Target Resolution Date: 05/09/2021 Goal Status: Active Ulcer/skin breakdown will heal within 14 weeks Date Initiated: 01/08/2021 Target Resolution Date: 06/08/2021 Goal Status: Active Interventions: Assess patient/caregiver ability to obtain necessary supplies Assess patient/caregiver ability to perform ulcer/skin care regimen upon admission and as needed Assess ulceration(s) every visit Notes: Electronic Signature(s) Signed: 01/09/2021 4:05:54 PM By: Yevonne Pax RN Entered By: Yevonne Pax on 01/08/2021 09:48:51 Kyle Spence, Kyle Spence ([id number]) -------------------------------------------------------------------------------- Pain Assessment Details Patient Name: Kyle Spence. Date of Service: 01/08/2021 8:45 AM Medical Record Number: [id number] Patient Account Number: [id number] Date of Birth/Sex: 02-08-1932 (87 y.o. M) Treating RN: Yevonne Pax Primary Care Tiasha Helvie: Marisue Ivan Other Clinician: Referring Ellyn Rubiano: Dennison Bulla Treating  Daryn Hicks/Extender: Rowan Blase in Treatment: 0 Active Problems Location of Pain Severity and Description of Pain Patient Has Paino No Site Locations Pain Management and Medication Current Pain Management: Electronic Signature(s) Signed: 01/09/2021 4:05:54 PM By: Yevonne Pax RN Entered By: Yevonne Pax on 01/08/2021 09:08:03 Kyle Spence ([id number]) -------------------------------------------------------------------------------- Patient/Caregiver Education Details Patient Name: Kyle Spence. Date of Service: 01/08/2021 8:45 AM Medical Record Number: [id number] Patient Account Number: [id number] Date of Birth/Gender: 05/17/1932 (87 y.o. M) Treating RN: Yevonne Pax Primary Care Physician: Marisue Ivan Other Clinician: Referring Physician: Dennison Bulla Treating Physician/Extender: Rowan Blase in Treatment: 0 Education Assessment Education Provided To: Patient Education Topics Provided Wound/Skin Impairment: Methods: Explain/Verbal Responses: State content correctly Electronic Signature(s) Signed: 01/09/2021 4:05:54 PM By: Yevonne Pax RN Entered By: Yevonne Pax on 01/08/2021 09:58:23 Kyle Spence ([id number]) -------------------------------------------------------------------------------- Wound Assessment Details Patient Name: Kyle Spence. Date of Service: 01/08/2021 8:45 AM Medical Record Number: [id number] Patient Account Number: [id number] Date of Birth/Sex: 1932/03/09 (87 y.o. M) Treating RN: Yevonne Pax Primary Care Solace Wendorff: Marisue Ivan Other Clinician: Referring Cully Luckow: Dennison Bulla Treating Railey Glad/Extender: Rowan Blase in Treatment: 0 Wound Status Wound Number: 1 Primary Etiology: Pressure Ulcer Wound Location: Sacrum Wound Status: Open Wounding Event: Gradually Appeared Comorbid History: Hypertension Date Acquired: 12/23/2020 Weeks Of Treatment: 0 Clustered Wound: No Photos Wound  Measurements Length: (cm) 5 Width: (cm) 7 Depth: (cm) 0.1 Area: (cm) 27.489 Volume: (cm) 2.749 % Reduction in Area: 0% % Reduction in Volume: 0% Epithelialization: None Tunneling: No Undermining: No Wound Description Classification: Unstageable/Unclassified Exudate Amount: Medium Exudate Type: Serosanguineous Exudate Color: red, brown Foul Odor After Cleansing: No Slough/Fibrino Yes Wound Bed Necrotic Amount: Large (67-100%) Exposed Structure Necrotic Quality: Adherent Slough Fascia Exposed: No Fat Layer (Subcutaneous Tissue)  Exposed: Yes Tendon Exposed: No Muscle Exposed: No Joint Exposed: No Bone Exposed: No Treatment Notes Wound #1 (Sacrum) Cleanser Normal Saline Discharge Instruction: Wash your hands with soap and water. Remove old dressing, discard into plastic bag and place into trash. Cleanse the wound with Normal Saline prior to applying a clean dressing using gauze sponges, not tissues or cotton balls. Do not scrub or use excessive force. Pat dry using gauze sponges, not tissue or cotton balls. Wound Cleanser NAZAIR, FORTENBERRY ([id number]) Discharge Instruction: Wash your hands with soap and water. Remove old dressing, discard into plastic bag and place into trash. Cleanse the wound with Wound Cleanser prior to applying a clean dressing using gauze sponges, not tissues or cotton balls. Do not scrub or use excessive force. Pat dry using gauze sponges, not tissue or cotton balls. Peri-Wound Care Topical Primary Dressing Hydrofera Blue Ready Transfer Foam, 4x5 (in/in) Discharge Instruction: Apply Hydrofera Blue Ready to wound bed as directed Secondary Dressing ABD Pad 5x9 (in/in) Discharge Instruction: Cover with ABD pad Secured With Tegaderm Film Transparent 4x4.75 (in/in) Discharge Instruction: Apply to wound bed Compression Wrap Compression Stockings Add-Ons Electronic Signature(s) Signed: 01/08/2021 10:18:17 AM By: Yevonne Pax RN Entered By: Yevonne Pax on 01/08/2021 10:18:16 Kyle Spence ([id number]) -------------------------------------------------------------------------------- Vitals Details Patient Name: Kyle Spence. Date of Service: 01/08/2021 8:45 AM Medical Record Number: [id number] Patient Account Number: [id number] Date of Birth/Sex: 1932/10/28 (87 y.o. M) Treating RN: Yevonne Pax Primary Care Tanayah Squitieri: Marisue Ivan Other Clinician: Referring Tao Satz: Dennison Bulla Treating Muriel Hannold/Extender: Rowan Blase in Treatment: 0 Vital Signs Time Taken: 09:08 Temperature (F): 98.2 Height (in): 70 Pulse (bpm): 76 Source: Stated Respiratory Rate (breaths/min): 18 Weight (lbs): 217 Blood Pressure (mmHg): 147/76 Source: Measured Reference Range: 80 - 120 mg / dl Body Mass Index (BMI): 31.1 Electronic Signature(s) Signed: 01/09/2021 4:05:54 PM By: Yevonne Pax RN Entered By: Yevonne Pax on 01/08/2021 09:09:12

## 2021-01-09 NOTE — Progress Notes (Signed)
[name], [name] ([id number]) Visit Report for 01/08/2021 Abuse/Suicide Risk Screen Details Patient Name: Kyle Spence, Kyle Spence. Date of Service: 01/08/2021 8:45 AM Medical Record Number: [id number] Patient Account Number: [id number] Date of Birth/Sex: August 23, 1932 (88 y.o. M) Treating RN: Yevonne Pax Primary Care Compton Brigance: Marisue Ivan Other Clinician: Referring Celes Dedic: Dennison Bulla Treating Shakima Nisley/Extender: Rowan Blase in Treatment: 0 Abuse/Suicide Risk Screen Items Answer ABUSE RISK SCREEN: Has anyone close to you tried to hurt or harm you recentlyo No Do you feel uncomfortable with anyone in your familyo No Has anyone forced you do things that you didnot want to doo No Electronic Signature(s) Signed: 01/09/2021 4:05:54 PM By: Yevonne Pax RN Entered By: Yevonne Pax on 01/08/2021 09:13:14 Kyle Spence ([id number]) -------------------------------------------------------------------------------- Activities of Daily Living Details Patient Name: Spence, Kyle. Date of Service: 01/08/2021 8:45 AM Medical Record Number: [id number] Patient Account Number: [id number] Date of Birth/Sex: 03-14-1932 (88 y.o. M) Treating RN: Yevonne Pax Primary Care Vicki Pasqual: Marisue Ivan Other Clinician: Referring Payson Evrard: Dennison Bulla Treating Henrik Orihuela/Extender: Rowan Blase in Treatment: 0 Activities of Daily Living Items Answer Activities of Daily Living (Please select one for each item) Drive Automobile Not Able Take Medications Not Able Use Telephone Not Able Care for Appearance Not Able Use Toilet Not Able Bath / Shower Not Able Dress Self Not Able Feed Self Completely Able Walk Not Able Get In / Out Bed Need Assistance Housework Not Able Prepare Meals Not Able Handle Money Not Able Shop for Self Not Able Electronic Signature(s) Signed: 01/09/2021 4:05:54 PM By: Yevonne Pax RN Entered By: Yevonne Pax on 01/08/2021 09:14:13 Kyle Spence  ([id number]) -------------------------------------------------------------------------------- Education Screening Details Patient Name: Kyle Spence. Date of Service: 01/08/2021 8:45 AM Medical Record Number: [id number] Patient Account Number: [id number] Date of Birth/Sex: August 08, 1932 (88 y.o. M) Treating RN: Yevonne Pax Primary Care Evalene Vath: Marisue Ivan Other Clinician: Referring Alisen Marsiglia: Dennison Bulla Treating Daily Doe/Extender: Rowan Blase in Treatment: 0 Primary Learner Assessed: Patient Learning Preferences/Education Level/Primary Language Learning Preference: Explanation Highest Education Level: High School Preferred Language: English Cognitive Barrier Language Barrier: No Translator Needed: No Memory Deficit: No Emotional Barrier: No Cultural/Religious Beliefs Affecting Medical Care: No Physical Barrier Impaired Vision: Yes Glasses Impaired Hearing: No Decreased Hand dexterity: No Knowledge/Comprehension Knowledge Level: Medium Comprehension Level: Medium Ability to understand written instructions: Medium Ability to understand verbal instructions: Medium Motivation Anxiety Level: Anxious Cooperation: Cooperative Education Importance: Acknowledges Need Interest in Health Problems: Uninterested Perception: Confused Willingness to Engage in Self-Management Medium Activities: Readiness to Engage in Self-Management Medium Activities: Electronic Signature(s) Signed: 01/09/2021 4:05:54 PM By: Yevonne Pax RN Entered By: Yevonne Pax on 01/08/2021 09:15:39 Kyle Spence ([id number]) -------------------------------------------------------------------------------- Fall Risk Assessment Details Patient Name: Kyle Spence. Date of Service: 01/08/2021 8:45 AM Medical Record Number: [id number] Patient Account Number: [id number] Date of Birth/Sex: [birth year] (88 y.o. M) Treating RN: Yevonne Pax Primary Care Kalyn Dimattia: Marisue Ivan Other  Clinician: Referring Galilea Quito: Dennison Bulla Treating Paisli Silfies/Extender: Rowan Blase in Treatment: 0 Fall Risk Assessment Items Have you had 2 or more falls in the last 12 monthso 0 Yes Have you had any fall that resulted in injury in the last 12 monthso 0 Yes FALLS RISK SCREEN History of falling - immediate or within 3 months 25 Yes Secondary diagnosis (Do you have 2 or more medical diagnoseso) 0 No Ambulatory aid None/bed rest/wheelchair/nurse 0 No Crutches/cane/walker 0 No Furniture 0 No Intravenous therapy Access/Saline/Heparin Lock 0 No Gait/Transferring Normal/ bed rest/ wheelchair 0 No Weak (short steps with or  without shuffle, stooped but able to lift head while walking, may 0 No seek support from furniture) Impaired (short steps with shuffle, may have difficulty arising from chair, head down, impaired 0 No balance) Mental Status Oriented to own ability 0 No Electronic Signature(s) Signed: 01/09/2021 4:05:54 PM By: Yevonne Pax RN Entered By: Yevonne Pax on 01/08/2021 09:15:49 Kyle Spence ([id number]) -------------------------------------------------------------------------------- Foot Assessment Details Patient Name: Kyle Spence. Date of Service: 01/08/2021 8:45 AM Medical Record Number: [id number] Patient Account Number: [id number] Date of Birth/Sex: 28-Sep-1932 (88 y.o. M) Treating RN: Yevonne Pax Primary Care Aayan Haskew: Marisue Ivan Other Clinician: Referring Yalonda Sample: Dennison Bulla Treating Rilyn Scroggs/Extender: Rowan Blase in Treatment: 0 Foot Assessment Items Site Locations + = Sensation present, - = Sensation absent, C = Callus, U = Ulcer R = Redness, W = Warmth, M = Maceration, PU = Pre-ulcerative lesion F = Fissure, S = Swelling, D = Dryness Assessment Right: Left: Other Deformity: No No Prior Foot Ulcer: No No Prior Amputation: No No Charcot Joint: No No Ambulatory Status: Non-ambulatory Assistance Device:  Wheelchair Gait: Surveyor, mining) Signed: 01/09/2021 4:05:54 PM By: Yevonne Pax RN Entered By: Yevonne Pax on 01/08/2021 09:16:21 Kyle Spence ([id number]) -------------------------------------------------------------------------------- Nutrition Risk Screening Details Patient Name: Kyle Spence. Date of Service: 01/08/2021 8:45 AM Medical Record Number: [id number] Patient Account Number: [id number] Date of Birth/Sex: [birth year] (88 y.o. M) Treating RN: Yevonne Pax Primary Care Sung Parodi: Marisue Ivan Other Clinician: Referring Ruble Buttler: Dennison Bulla Treating Yosmar Ryker/Extender: Rowan Blase in Treatment: 0 Height (in): 70 Weight (lbs): 217 Body Mass Index (BMI): 31.1 Nutrition Risk Screening Items Score Screening NUTRITION RISK SCREEN: I have an illness or condition that made me change the kind and/or amount of food I eat 0 No I eat fewer than two meals per day 0 No I eat few fruits and vegetables, or milk products 0 No I have three or more drinks of beer, liquor or wine almost every day 0 No I have tooth or mouth problems that make it hard for me to eat 0 No I don't always have enough money to buy the food I need 0 No I eat alone most of the time 0 No I take three or more different prescribed or over-the-counter drugs a day 1 Yes Without wanting to, I have lost or gained 10 pounds in the last six months 0 No I am not always physically able to shop, cook and/or feed myself 2 Yes Nutrition Protocols Good Risk Protocol Moderate Risk Protocol 0 Provide education on nutrition High Risk Proctocol Risk Level: Moderate Risk Score: 3 Electronic Signature(s) Signed: 01/09/2021 4:05:54 PM By: Yevonne Pax RN Entered By: Yevonne Pax on 01/08/2021 09:16:05

## 2021-01-09 NOTE — Progress Notes (Signed)
CAMARON, Kyle ([id number]) Visit Report for 01/08/2021 Chief Complaint Document Details Patient Name: Kyle Spence, Kyle Spence. Date of Service: 01/08/2021 8:45 AM Medical Record Number: [id number] Patient Account Number: [id number] Date of Birth/Sex: 28-Jul-1932 (87 y.o. M) Treating RN: Yevonne Pax Primary Care Provider: Marisue Ivan Other Clinician: Referring Provider: Dennison Bulla Treating Provider/Extender: Rowan Blase in Treatment: 0 Information Obtained from: Patient Chief Complaint Pressure ulcer sacral region Electronic Signature(s) Signed: 01/08/2021 9:44:06 AM By: Lenda Kelp PA-C Entered By: Lenda Kelp on 01/08/2021 09:44:06 Kyle Spence ([id number]) -------------------------------------------------------------------------------- Debridement Details Patient Name: Kyle Spence Date of Service: 01/08/2021 8:45 AM Medical Record Number: [id number] Patient Account Number: [id number] Date of Birth/Sex: [birth year] (87 y.o. M) Treating RN: Yevonne Pax Primary Care Provider: Marisue Ivan Other Clinician: Referring Provider: Dennison Bulla Treating Provider/Extender: Rowan Blase in Treatment: 0 Debridement Performed for Wound #1 Sacrum Assessment: Performed By: Physician Nelida Meuse., PA-C Debridement Type: Debridement Level of Consciousness (Pre- Awake and Alert procedure): Pre-procedure Verification/Time Out Yes - 09:50 Taken: Start Time: 09:50 Pain Control: Lidocaine 4% Topical Solution Total Area Debrided (L x W): 5 (cm) x 7 (cm) = 35 (cm) Tissue and other material Viable, Non-Viable, Slough, Subcutaneous, Slough debrided: Level: Skin/Subcutaneous Tissue Debridement Description: Excisional Instrument: Curette Bleeding: Moderate Hemostasis Achieved: Pressure End Time: 09:55 Procedural Pain: 0 Post Procedural Pain: 0 Response to Treatment: Procedure was tolerated well Level of Consciousness (Post- Awake and  Alert procedure): Post Debridement Measurements of Total Wound Length: (cm) 5 Stage: Unstageable/Unclassified Width: (cm) 7 Depth: (cm) 0.1 Volume: (cm) 2.749 Character of Wound/Ulcer Post Debridement: Improved Post Procedure Diagnosis Same as Pre-procedure Electronic Signature(s) Signed: 01/08/2021 5:37:18 PM By: Lenda Kelp PA-C Signed: 01/09/2021 4:05:54 PM By: Yevonne Pax RN Entered By: Yevonne Pax on 01/08/2021 09:51:58 Kyle Spence ([id number]) -------------------------------------------------------------------------------- HPI Details Patient Name: Kyle Spence. Date of Service: 01/08/2021 8:45 AM Medical Record Number: [id number] Patient Account Number: [id number] Date of Birth/Sex: 02-26-1932 (87 y.o. M) Treating RN: Yevonne Pax Primary Care Provider: Marisue Ivan Other Clinician: Referring Provider: Dennison Bulla Treating Provider/Extender: Rowan Blase in Treatment: 0 History of Present Illness HPI Description: 01/08/2021 upon inspection today patient presents for initial inspection here in the clinic concerning a significant wound over the sacral area. Unfortunately this is something that began following a fractured hip that occurred just before Thanksgiving 2022. Subsequently the patient was in the hospital had this. And then went [facility] for [facility] and physical therapy. The surgeon wanted this done 5 days a week instead reason he had to be inpatient at a skilled nursing facility for this. With that being said he unfortunately during the time in the skilled nursing facility developed the ulceration in the sacral region which initially was very leathery and black for what I am being told the wound care physician that comes into the facility there actually took care of this quite well based on what. He scored as a been using Santyl that loosen things up quite a bit. With that being said right now it appears to be very moist and  that is one of my biggest concerns to be perfectly honest. I also think that the other issue is keeping pressure off which I am not sure is happening quite as effectively as it should right now. His son is living with him to help take care of him and has also been doing what he can as far as dressing changes or otherwise. The patient does have  a history of hypertension, generalized weakness, dementia, and chronic kidney disease stage III. Electronic Signature(s) Signed: 01/08/2021 10:38:22 AM By: Worthy Keeler PA-C Entered By: Worthy Keeler on 01/08/2021 10:38:21 Kyle Spence ([id number]) -------------------------------------------------------------------------------- Physical Exam Details Patient Name: Kyle Spence, Kyle Spence. Date of Service: 01/08/2021 8:45 AM Medical Record Number: [id number] Patient Account Number: [id number] Date of Birth/Sex: Jan 15, 1933 (87 y.o. M) Treating RN: Carlene Coria Primary Care Provider: Dion Body Other Clinician: Referring Provider: Argie Ramming Treating Provider/Extender: Skipper Cliche in Treatment: 0 Constitutional patient is hypertensive.. pulse regular and within target range for patient.Marland Kitchen respirations regular, non-labored and within target range for patient.Marland Kitchen temperature within target range for patient.. Well-nourished and well-hydrated in no acute distress. Eyes conjunctiva clear no eyelid edema noted. pupils equal round and reactive to light and accommodation. Ears, Nose, Mouth, and Throat no gross abnormality of ear auricles or external auditory canals. normal hearing noted during conversation. mucus membranes moist. Respiratory normal breathing without difficulty. Musculoskeletal Patient unable to walk without assistance. Psychiatric this patient is able to make decisions and demonstrates good insight into disease process. Alert and Oriented x 3. pleasant and cooperative. Notes Upon inspection patient's wound bed did have  necrotic debris noted over the surface of the wound which did require some sharp debridement today. Fortunately I was able to debride this way without him having any pain which is great news. With that being said the unfortunate thing here is simply that he has the wound to begin with. This is going to take some time to get better and I do think that pressure is good to be the key factor if we are offloading well this is good to get better quite readily and effectively his son is taking great care of him. Electronic Signature(s) Signed: 01/08/2021 10:39:01 AM By: Worthy Keeler PA-C Entered By: Worthy Keeler on 01/08/2021 10:39:01 Kyle Spence ([id number]) -------------------------------------------------------------------------------- Physician Orders Details Patient Name: Kyle Spence Date of Service: 01/08/2021 8:45 AM Medical Record Number: [id number] Patient Account Number: [id number] Date of Birth/Sex: 02/13/1932 (87 y.o. M) Treating RN: Carlene Coria Primary Care Provider: Dion Body Other Clinician: Referring Provider: Argie Ramming Treating Provider/Extender: Skipper Cliche in Treatment: 0 Verbal / Phone Orders: No Diagnosis Coding ICD-10 Coding Code Description L89.150 Pressure ulcer of sacral region, unstageable M62.81 Muscle weakness (generalized) I10 Essential (primary) hypertension N18.30 Chronic kidney disease, stage 3 unspecified Follow-up Appointments o Return Appointment in 2 weeks. [facility] for wound care. May utilize formulary equivalent dressing for wound treatment orders unless otherwise specified. Home Health Nurse may visit [facility] to address patientos wound care needs. Kathaleen Bury phone [phone] Bathing/ Shower/ Hygiene o Clean wound with Normal Saline or wound cleanser. Anesthetic (Use 'Patient Medications' Section for Anesthetic Order Entry) o Lidocaine applied to wound  bed Off-Loading o Hospital bed/mattress o Low air-loss mattress (Group 2) - HOME HEATH TO ORDER PLEASE o Turn and reposition every 2 hours Additional Orders / Instructions o Follow Nutritious Diet and Increase Protein Intake Wound Treatment Wound #1 - Sacrum Cleanser: Normal Saline 1 x Per Day/30 Days Discharge Instructions: Wash your hands with soap and water. Remove old dressing, discard into plastic bag and place into trash. Cleanse the wound with Normal Saline prior to applying a clean dressing using gauze sponges, not tissues or cotton balls. Do not scrub or use excessive force. Pat dry using gauze sponges, not tissue or cotton balls. Cleanser: Wound Cleanser (Home Health) 1 x Per  Day/30 Days Discharge Instructions: Wash your hands with soap and water. Remove old dressing, discard into plastic bag and place into trash. Cleanse the wound with Wound Cleanser prior to applying a clean dressing using gauze sponges, not tissues or cotton balls. Do not scrub or use excessive force. Pat dry using gauze sponges, not tissue or cotton balls. Primary Dressing: Hydrofera Blue Ready Transfer Foam, 4x5 (in/in) (Home Health) 1 x Per Day/30 Days Discharge Instructions: Apply Hydrofera Blue Ready to wound bed as directed Secured With: Tegaderm Film Transparent 4x4.75 (in/in) ([facility]) 1 x Per Day/30 Days Discharge Instructions: Apply to wound bed Electronic Signature(s) Signed: 01/08/2021 5:37:18 PM By: Worthy Keeler PA-C Signed: 01/09/2021 4:05:54 PM By: Carlene Coria RN Entered By: Carlene Coria on 01/08/2021 10:28:01 Kyle Spence ([id number]) Kyle Spence, Kyle Spence ([id number]) -------------------------------------------------------------------------------- Problem List Details Patient Name: Kyle Spence, Kyle Spence. Date of Service: 01/08/2021 8:45 AM Medical Record Number: [id number] Patient Account Number: [id number] Date of Birth/Sex: [birth year] (87 y.o. M) Treating RN: Carlene Coria Primary Care Provider: Dion Body Other Clinician: Referring Provider: Argie Ramming Treating Provider/Extender: Skipper Cliche in Treatment: 0 Active Problems ICD-10 Encounter Code Description Active Date MDM Diagnosis L89.150 Pressure ulcer of sacral region, unstageable 01/08/2021 No Yes M62.81 Muscle weakness (generalized) 01/08/2021 No Yes I10 Essential (primary) hypertension 01/08/2021 No Yes N18.30 Chronic kidney disease, stage 3 unspecified 01/08/2021 No Yes Inactive Problems Resolved Problems Electronic Signature(s) Signed: 01/08/2021 9:43:45 AM By: Worthy Keeler PA-C Entered By: Worthy Keeler on 01/08/2021 09:43:45 Harkey, Joyice Faster ([id number]) -------------------------------------------------------------------------------- Progress Note Details Patient Name: Kyle Spence. Date of Service: 01/08/2021 8:45 AM Medical Record Number: [id number] Patient Account Number: [id number] Date of Birth/Sex: 1932-03-11 (87 y.o. M) Treating RN: Carlene Coria Primary Care Provider: Dion Body Other Clinician: Referring Provider: Argie Ramming Treating Provider/Extender: Skipper Cliche in Treatment: 0 Subjective Chief Complaint Information obtained from Patient Pressure ulcer sacral region History of Present Illness (HPI) 01/08/2021 upon inspection today patient presents for initial inspection here in the clinic concerning a significant wound over the sacral area. Unfortunately this is something that began following a fractured hip that occurred just before Thanksgiving 2022. Subsequently the patient was in the hospital had this. And then went to [facility] for [facility] and physical therapy. The surgeon wanted this done 5 days a week instead reason he had to be inpatient at a skilled nursing facility for this. With that being said he unfortunately during the time in the skilled nursing facility developed the ulceration in the sacral  region which initially was very leathery and black for what I am being told the wound care physician that comes into the facility there actually took care of this quite well based on what. He scored as a been using Santyl that loosen things up quite a bit. With that being said right now it appears to be very moist and that is one of my biggest concerns to be perfectly honest. I also think that the other issue is keeping pressure off which I am not sure is happening quite as effectively as it should right now. His son is living with him to help take care of him and has also been doing what he can as far as dressing changes or otherwise. The patient does have a history of hypertension, generalized weakness, dementia, and chronic kidney disease stage III. Patient History Allergies enalapril, penicillin Social History Never smoker, Marital Status - Widowed, Alcohol Use - Never, Drug Use - No History, Caffeine  Use - Rarely. Medical History Cardiovascular Patient has history of Hypertension Medical And Surgical History Notes Genitourinary CKD 3 Review of Systems (ROS) Integumentary (Skin) Complains or has symptoms of Wounds. Objective Constitutional patient is hypertensive.. pulse regular and within target range for patient.Marland Kitchen respirations regular, non-labored and within target range for patient.Marland Kitchen temperature within target range for patient.. Well-nourished and well-hydrated in no acute distress. Vitals Time Taken: 9:08 AM, Height: 70 in, Source: Stated, Weight: 217 lbs, Source: Measured, BMI: 31.1, Temperature: 98.2 F, Pulse: 76 bpm, Respiratory Rate: 18 breaths/min, Blood Pressure: 147/76 mmHg. Eyes conjunctiva clear no eyelid edema noted. pupils equal round and reactive to light and accommodation. Ears, Nose, Mouth, and Throat no gross abnormality of ear auricles or external auditory canals. normal hearing noted during conversation. mucus membranes moist. Respiratory Kyle Spence, Kyle L.  ([id number]) normal breathing without difficulty. Musculoskeletal Patient unable to walk without assistance. Psychiatric this patient is able to make decisions and demonstrates good insight into disease process. Alert and Oriented x 3. pleasant and cooperative. General Notes: Upon inspection patient's wound bed did have necrotic debris noted over the surface of the wound which did require some sharp debridement today. Fortunately I was able to debride this way without him having any pain which is great news. With that being said the unfortunate thing here is simply that he has the wound to begin with. This is going to take some time to get better and I do think that pressure is good to be the key factor if we are offloading well this is good to get better quite readily and effectively his son is taking great care of him. Integumentary (Hair, Skin) Wound #1 status is Open. Original cause of wound was Gradually Appeared. The date acquired was: 12/23/2020. The wound is located on the Sacrum. The wound measures 5cm length x 7cm width x 0.1cm depth; 27.489cm^2 area and 2.749cm^3 volume. There is Fat Layer (Subcutaneous Tissue) exposed. There is no tunneling or undermining noted. There is a medium amount of serosanguineous drainage noted. There is a large (67-100%) amount of necrotic tissue within the wound bed including Adherent Slough. Assessment Active Problems ICD-10 Pressure ulcer of sacral region, unstageable Muscle weakness (generalized) Essential (primary) hypertension Chronic kidney disease, stage 3 unspecified Procedures Wound #1 Pre-procedure diagnosis of Wound #1 is a Pressure Ulcer located on the Sacrum . There was a Excisional Skin/Subcutaneous Tissue Debridement with a total area of 35 sq cm performed by Nelida Meuse., PA-C. With the following instrument(s): Curette to remove Viable and Non-Viable tissue/material. Material removed includes Subcutaneous Tissue and Slough and after  achieving pain control using Lidocaine 4% Topical Solution. No specimens were taken. A time out was conducted at 09:50, prior to the start of the procedure. A Moderate amount of bleeding was controlled with Pressure. The procedure was tolerated well with a pain level of 0 throughout and a pain level of 0 following the procedure. Post Debridement Measurements: 5cm length x 7cm width x 0.1cm depth; 2.749cm^3 volume. Post debridement Stage noted as Unstageable/Unclassified. Character of Wound/Ulcer Post Debridement is improved. Post procedure Diagnosis Wound #1: Same as Pre-Procedure Plan Follow-up Appointments: Return Appointment in 2 weeks. Home Health: [facility] for wound care. May utilize formulary equivalent dressing for wound treatment orders unless otherwise specified. Home Health Nurse may visit [facility] to address patient s wound care needs. Oneida Arenas phone [phone] [phone] Bathing/ Shower/ Hygiene: Clean wound with Normal Saline or wound cleanser. Anesthetic (Use 'Patient Medications' Section for Anesthetic Order  Entry): Lidocaine applied to wound bed Off-Loading: Hospital bed/mattress Low air-loss mattress (Group 2) - HOME HEATH TO ORDER PLEASE Turn and reposition every 2 hours Additional Orders / Instructions: Follow Nutritious Diet and Increase Protein Intake WOUND #1: - Sacrum Wound Laterality: Cleanser: Normal Saline 1 x Per Day/30 Days Discharge Instructions: Wash your hands with soap and water. Remove old dressing, discard into plastic bag and place into trash. Kyle Spence, Kyle L. ([id number]) Cleanse the wound with Normal Saline prior to applying a clean dressing using gauze sponges, not tissues or cotton balls. Do not scrub or use excessive force. Pat dry using gauze sponges, not tissue or cotton balls. Cleanser: Wound Cleanser (Home Health) 1 x Per Day/30 Days Discharge Instructions: Wash your hands with soap and water. Remove old dressing, discard  into plastic bag and place into trash. Cleanse the wound with Wound Cleanser prior to applying a clean dressing using gauze sponges, not tissues or cotton balls. Do not scrub or use excessive force. Pat dry using gauze sponges, not tissue or cotton balls. Primary Dressing: Hydrofera Blue Ready Transfer Foam, 4x5 (in/in) (Home Health) 1 x Per Day/30 Days Discharge Instructions: Apply Hydrofera Blue Ready to wound bed as directed Secured With: Tegaderm Film Transparent 4x4.75 (in/in) (Home Health) 1 x Per Day/30 Days Discharge Instructions: Apply to wound bed 1. I would recommend aggressive pressure offloading and that was discussed with the son today he is in agreement and understands the need and importance of this. 2. I am also can recommend that we have the patient [facility] I think will be a good option for him dressing was working to secure this with Tegaderm to try to keep it clean due to the close proximity to the anal region. 3. Also I am going to recommend that we see about getting an air mattress for the patient as well as a Roho cushion through home health. We will see patient back for reevaluation in 2 weeks here in the clinic. If anything worsens or changes patient will contact our office for additional recommendations. Electronic Signature(s) Signed: 01/08/2021 10:40:08 AM By: Lenda KelpStone III, Milo Solana PA-C Entered By: Lenda KelpStone III, Shaliah Wann on 01/08/2021 10:40:08 Kyle Spence, Kyle L. ([id number]) -------------------------------------------------------------------------------- ROS/PFSH Details Patient Name: Kyle Spence, Kyle L. Date of Service: 01/08/2021 8:45 AM Medical Record Number: [id number] Patient Account Number: [id number] Date of Birth/Sex: 1932/11/04 (87 y.o. M) Treating RN: Yevonne PaxEpps, Carrie Primary Care Provider: Marisue IvanLinthavong, Kanhka Other Clinician: Referring Provider: Dennison BullaMICHEL, BRENDAN Treating Provider/Extender: Rowan BlaseStone, Elianis Fischbach Weeks in Treatment: 0 Integumentary  (Skin) Complaints and Symptoms: Positive for: Wounds Cardiovascular Medical History: Positive for: Hypertension Genitourinary Medical History: Past Medical History Notes: CKD 3 Immunizations Pneumococcal Vaccine: Received Pneumococcal Vaccination: Yes Received Pneumococcal Vaccination On or After 60th Birthday: Yes Implantable Devices None Family and Social History Never smoker; Marital Status - Widowed; Alcohol Use: Never; Drug Use: No History; Caffeine Use: Rarely; Financial Concerns: No; Food, Clothing or Shelter Needs: No; Support System Lacking: No; Transportation Concerns: No Electronic Signature(s) Signed: 01/08/2021 5:37:18 PM By: Lenda KelpStone III, Deron Poole PA-C Signed: 01/09/2021 4:05:54 PM By: Yevonne PaxEpps, Carrie RN Entered By: Yevonne PaxEpps, Carrie on 01/08/2021 09:12:55 Kyle Spence, Kyle L. ([id number]) -------------------------------------------------------------------------------- SuperBill Details Patient Name: Kyle Spence, Kyle L. Date of Service: 01/08/2021 Medical Record Number: [id number] Patient Account Number: [id number] Date of Birth/Sex: 1932/11/04 [age] y.o. M) Treating RN: Yevonne PaxEpps, Carrie Primary Care Provider: Marisue IvanLinthavong, Kanhka Other Clinician: Referring Provider: Dennison BullaMICHEL, BRENDAN Treating Provider/Extender: Rowan BlaseStone, Eliah Marquard Weeks in Treatment: 0 Diagnosis Coding ICD-10 Codes Code Description L89.150  Pressure ulcer of sacral region, unstageable M62.81 Muscle weakness (generalized) I10 Essential (primary) hypertension N18.30 Chronic kidney disease, stage 3 unspecified Facility Procedures CPT4 Code: [id number] Description: [id number] - WOUND CARE VISIT-LEV 3 EST PT Modifier: Quantity: 1 CPT4 Code: [id number] Description: [id number] - DEB SUBQ TISSUE 20 SQ CM/< Modifier: Quantity: 1 CPT4 Code: Description: ICD-10 Diagnosis Description L89.150 Pressure ulcer of sacral region, unstageable Modifier: Quantity: CPT4 Code: [id number] Description: [id number] - DEB SUBQ TISS EA ADDL 20CM Modifier: Quantity:  1 CPT4 Code: Description: ICD-10 Diagnosis Description L89.150 Pressure ulcer of sacral region, unstageable Modifier: Quantity: Physician Procedures CPT4 [id number] Description: [id number] - WC PHYS LEVEL 4 - NEW PT Modifier: 25 Quantity: 1 CPT4 Code: Description: ICD-10 Diagnosis Description L89.150 Pressure ulcer of sacral region, unstageable M62.81 Muscle weakness (generalized) I10 Essential (primary) hypertension N18.30 Chronic kidney disease, stage 3 unspecified Modifier: Quantity: CPT4 [id number] Description: [id number] - WC PHYS SUBQ TISS 20 SQ CM Modifier: Quantity: 1 CPT4 Code: Description: ICD-10 Diagnosis Description L89.150 Pressure ulcer of sacral region, unstageable Modifier: Quantity: CPT4 [id number] Description: [id number] - WC PHYS SUBQ TISS EA ADDL 20 CM Modifier: Quantity: 1 CPT4 Code: Description: ICD-10 Diagnosis Description L89.150 Pressure ulcer of sacral region, unstageable Modifier: Quantity: Electronic Signature(s) Signed: 01/08/2021 10:40:25 AM By: Worthy Keeler PA-C Entered By: Worthy Keeler on 01/08/2021 10:40:24

## 2021-01-18 ENCOUNTER — Ambulatory Visit: Payer: Medicare Other | Admitting: Physician Assistant

## 2021-01-25 ENCOUNTER — Encounter: Payer: Medicare Other | Admitting: Internal Medicine

## 2021-01-25 ENCOUNTER — Other Ambulatory Visit: Payer: Self-pay

## 2021-01-25 DIAGNOSIS — L89150 Pressure ulcer of sacral region, unstageable: Secondary | ICD-10-CM | POA: Diagnosis not present

## 2021-01-25 NOTE — Progress Notes (Signed)
SEANPAUL, PREECE ([id number]) Visit Report for 01/25/2021 Arrival Information Details Patient Name: Kyle Spence, Kyle Spence. Date of Service: 01/25/2021 3:15 PM Medical Record Number: [id number] Patient Account Number: [id number] Date of Birth/Sex: 1932-06-02 (87 y.o. M) Treating RN: Donnamarie Poag Primary Care Ferrin Liebig: Dion Body Other Clinician: Referring Micky Overturf: Dion Body Treating Aidin Doane/Extender: Tito Dine in Treatment: 2 Visit Information History Since Last Visit Added or deleted any medications: No Patient Arrived: Gilford Rile Had a fall or experienced change in No Arrival Time: 15:34 activities of daily living that may affect Accompanied By: self risk of falls: Transfer Assistance: None Hospitalized since last visit: No Patient Identification Verified: Yes Has Dressing in Place as Prescribed: Yes Secondary Verification Process Completed: Yes Pain Present Now: No Patient Requires Transmission-Based Precautions: No Patient Has Alerts: No Electronic Signature(s) Signed: 01/25/2021 4:08:17 PM By: Donnamarie Poag Entered By: Donnamarie Poag on 01/25/2021 15:34:46 Kyle Spence ([id number]) -------------------------------------------------------------------------------- Encounter Discharge Information Details Patient Name: Kyle Spence. Date of Service: 01/25/2021 3:15 PM Medical Record Number: [id number] Patient Account Number: [id number] Date of Birth/Sex: 1932/08/21 (87 y.o. M) Treating RN: Donnamarie Poag Primary Care Brunella Wileman: Dion Body Other Clinician: Referring Kelissa Merlin: Dion Body Treating Jenae Tomasello/Extender: Tito Dine in Treatment: 2 Encounter Discharge Information Items Post Procedure Vitals Discharge Condition: Stable Temperature (F): 98.1 Ambulatory Status: Wheelchair Pulse (bpm): 84 Discharge Destination: Home Respiratory Rate (breaths/min): 16 Transportation: Private Auto Blood Pressure (mmHg):  134/78 Accompanied By: son Schedule Follow-up Appointment: Yes Clinical Summary of Care: Electronic Signature(s) Signed: 01/25/2021 4:08:17 PM By: Donnamarie Poag Entered By: Donnamarie Poag on 01/25/2021 16:03:19 Kyle Spence ([id number]) -------------------------------------------------------------------------------- Lower Extremity Assessment Details Patient Name: Kyle Spence. Date of Service: 01/25/2021 3:15 PM Medical Record Number: [id number] Patient Account Number: [id number] Date of Birth/Sex: 26-Mar-1932 (87 y.o. M) Treating RN: Donnamarie Poag Primary Care Brittnae Aschenbrenner: Dion Body Other Clinician: Referring Torrance Stockley: Dion Body Treating Manie Bealer/Extender: Tito Dine in Treatment: 2 Electronic Signature(s) Signed: 01/25/2021 4:08:17 PM By: Donnamarie Poag Entered By: Donnamarie Poag on 01/25/2021 15:44:13 Kuenzi, Joyice Faster ([id number]) -------------------------------------------------------------------------------- Multi Wound Chart Details Patient Name: Kyle Spence. Date of Service: 01/25/2021 3:15 PM Medical Record Number: [id number] Patient Account Number: [id number] Date of Birth/Sex: 01-29-1932 (87 y.o. M) Treating RN: Donnamarie Poag Primary Care Merwin Breden: Dion Body Other Clinician: Referring Timmothy Baranowski: Dion Body Treating Raynie Steinhaus/Extender: Tito Dine in Treatment: 2 Vital Signs Height(in): 70 Pulse(bpm): 90 Weight(lbs): 217 Blood Pressure(mmHg): 161/83 Body Mass Index(BMI): 31 Temperature(F): 98.4 Respiratory Rate(breaths/min): 16 Photos: [N/A:N/A] Wound Location: Sacrum N/A N/A Wounding Event: Gradually Appeared N/A N/A Primary Etiology: Pressure Ulcer N/A N/A Comorbid History: Hypertension N/A N/A Date Acquired: 12/23/2020 N/A N/A Weeks of Treatment: 2 N/A N/A Wound Status: Open N/A N/A Measurements L x W x D (cm) 2.5x5x0.1 N/A N/A Area (cm) : 9.817 N/A N/A Volume (cm) : 0.982 N/A N/A % Reduction in  Area: 64.30% N/A N/A % Reduction in Volume: 64.30% N/A N/A Classification: Unstageable/Unclassified N/A N/A Exudate Amount: Medium N/A N/A Exudate Type: Serosanguineous N/A N/A Exudate Color: red, brown N/A N/A Granulation Amount: Small (1-33%) N/A N/A Granulation Quality: Red, Pink N/A N/A Necrotic Amount: Large (67-100%) N/A N/A Exposed Structures: Fat Layer (Subcutaneous Tissue): N/A N/A Yes Fascia: No Tendon: No Muscle: No Joint: No Bone: No Epithelialization: None N/A N/A Treatment Notes Electronic Signature(s) Signed: 01/25/2021 4:08:17 PM By: Donnamarie Poag Entered By: Donnamarie Poag on 01/25/2021 15:44:43 Kyle Spence ([id number]) -------------------------------------------------------------------------------- [place] Details Patient Name: Kyle Spence. Date of Service:  01/25/2021 3:15 PM Medical Record Number: [id number] Patient Account Number: [id number] Date of Birth/Sex: 03-07-1932 (88 y.o. M) Treating RN: Donnamarie Poag Primary Care Arline Ketter: Dion Body Other Clinician: Referring Shermaine Rivet: Dion Body Treating Wadell Craddock/Extender: Tito Dine in Treatment: 2 Active Inactive Abuse / Safety / Falls / Self Care Management Nursing Diagnoses: Potential for injury related to falls Potential for injury related to transfers Self care deficit: actual or potential Goals: Patient/caregiver will verbalize understanding of skin care regimen Date Initiated: 01/08/2021 Date Inactivated: 01/25/2021 Target Resolution Date: 02/08/2021 Goal Status: Met Patient/caregiver will verbalize understanding of the importance to maintain current immunizations/vaccinations Date Initiated: 01/08/2021 Date Inactivated: 01/25/2021 Target Resolution Date: 02/08/2021 Goal Status: Met Patient/caregiver will verbalize/demonstrate measure taken to improve self care Date Initiated: 01/08/2021 Target Resolution Date: 03/11/2021 Goal Status:  Active Patient/caregiver will verbalize/demonstrate measures taken to improve the patient's personal safety Date Initiated: 01/08/2021 Target Resolution Date: 04/08/2021 Goal Status: Active Patient/caregiver will verbalize/demonstrate measures taken to prevent injury and/or falls Date Initiated: 01/08/2021 Target Resolution Date: 05/09/2021 Goal Status: Active Patient/caregiver will verbalize/demonstrate understanding of what to do in case of emergency Date Initiated: 01/08/2021 Target Resolution Date: 06/08/2021 Goal Status: Active Interventions: Assess fall risk on admission and as needed Assess: immobility, friction, shearing, incontinence upon admission and as needed Assess impairment of mobility on admission and as needed per policy Assess personal safety and home safety (as indicated) on admission and as needed Assess self care needs on admission and as needed Patient/Caregiver referred to community resources (specify in notes) Notes: Wound/Skin Impairment Nursing Diagnoses: Knowledge deficit related to ulceration/compromised skin integrity Goals: Patient/caregiver will verbalize understanding of skin care regimen Date Initiated: 01/08/2021 Target Resolution Date: 02/08/2021 Goal Status: Active Ulcer/skin breakdown will have a volume reduction of 30% by week 4 Date Initiated: 01/08/2021 Target Resolution Date: 03/11/2021 Goal Status: Active Ulcer/skin breakdown will have a volume reduction of 50% by week 8 Date Initiated: 01/08/2021 Target Resolution Date: 04/08/2021 Goal Status: Active Kyle Spence, Kyle Spence ([id number]) Ulcer/skin breakdown will have a volume reduction of 80% by week 12 Date Initiated: 01/08/2021 Target Resolution Date: 05/09/2021 Goal Status: Active Ulcer/skin breakdown will heal within 14 weeks Date Initiated: 01/08/2021 Target Resolution Date: 06/08/2021 Goal Status: Active Interventions: Assess patient/caregiver ability to obtain necessary  supplies Assess patient/caregiver ability to perform ulcer/skin care regimen upon admission and as needed Assess ulceration(s) every visit Notes: Electronic Signature(s) Signed: 01/25/2021 4:08:17 PM By: Donnamarie Poag Entered By: Donnamarie Poag on 01/25/2021 15:44:33 Kyle Spence ([id number]) -------------------------------------------------------------------------------- Pain Assessment Details Patient Name: Kyle Spence. Date of Service: 01/25/2021 3:15 PM Medical Record Number: [id number] Patient Account Number: [id number] Date of Birth/Sex: Oct 05, 1932 (87 y.o. M) Treating RN: Donnamarie Poag Primary Care Lorenso Quirino: Dion Body Other Clinician: Referring Mccartney Brucks: Dion Body Treating Akai Dollard/Extender: Tito Dine in Treatment: 2 Active Problems Location of Pain Severity and Description of Pain Patient Has Paino No Site Locations Rate the pain. Current Pain Level: 0 Pain Management and Medication Current Pain Management: Electronic Signature(s) Signed: 01/25/2021 4:08:17 PM By: Donnamarie Poag Entered By: Donnamarie Poag on 01/25/2021 15:37:41 Kyle Spence ([id number]) -------------------------------------------------------------------------------- Patient/Caregiver Education Details Patient Name: Kyle Spence Date of Service: 01/25/2021 3:15 PM Medical Record Number: [id number] Patient Account Number: [id number] Date of Birth/Gender: Dec 15, 1932 (87 y.o. M) Treating RN: Donnamarie Poag Primary Care Physician: Dion Body Other Clinician: Referring Physician: Dion Body Treating Physician/Extender: Tito Dine in Treatment: 2 Education Assessment Education Provided To: Patient and Caregiver Education Topics Provided Basic Hygiene: Nutrition: Pressure:  Wound Debridement: Wound/Skin Impairment: Electronic Signature(s) Signed: 01/25/2021 4:08:17 PM By: Donnamarie Poag Entered By: Donnamarie Poag on 01/25/2021 15:59:07 Kyle Spence ([id number]) -------------------------------------------------------------------------------- Wound Assessment Details Patient Name: Kyle Spence. Date of Service: 01/25/2021 3:15 PM Medical Record Number: [id number] Patient Account Number: [id number] Date of Birth/Sex: 1932/05/04 (87 y.o. M) Treating RN: Donnamarie Poag Primary Care Kamani Lewter: Dion Body Other Clinician: Referring Desarai Barrack: Dion Body Treating Iysha Mishkin/Extender: Tito Dine in Treatment: 2 Wound Status Wound Number: 1 Primary Etiology: Pressure Ulcer Wound Location: Sacrum Wound Status: Open Wounding Event: Gradually Appeared Comorbid History: Hypertension Date Acquired: 12/23/2020 Weeks Of Treatment: 2 Clustered Wound: No Photos Wound Measurements Length: (cm) 2.5 Width: (cm) 5 Depth: (cm) 0.1 Area: (cm) 9.817 Volume: (cm) 0.982 % Reduction in Area: 64.3% % Reduction in Volume: 64.3% Epithelialization: None Tunneling: No Undermining: No Wound Description Classification: Unstageable/Unclassified Exudate Amount: Medium Exudate Type: Serosanguineous Exudate Color: red, brown Foul Odor After Cleansing: No Slough/Fibrino Yes Wound Bed Granulation Amount: Small (1-33%) Exposed Structure Granulation Quality: Red, Pink Fascia Exposed: No Necrotic Amount: Large (67-100%) Fat Layer (Subcutaneous Tissue) Exposed: Yes Necrotic Quality: Adherent Slough Tendon Exposed: No Muscle Exposed: No Joint Exposed: No Bone Exposed: No Treatment Notes Wound #1 (Sacrum) Cleanser Normal Saline Discharge Instruction: Wash your hands with soap and water. Remove old dressing, discard into plastic bag and place into trash. Cleanse the wound with Normal Saline prior to applying a clean dressing using gauze sponges, not tissues or cotton balls. Do not scrub or use excessive force. Pat dry using gauze sponges, not tissue or cotton balls. Wound Cleanser Kyle Spence, Kyle Spence  ([id number]) Discharge Instruction: Wash your hands with soap and water. Remove old dressing, discard into plastic bag and place into trash. Cleanse the wound with Wound Cleanser prior to applying a clean dressing using gauze sponges, not tissues or cotton balls. Do not scrub or use excessive force. Pat dry using gauze sponges, not tissue or cotton balls. Peri-Wound Care Topical Primary Dressing Hydrofera Blue Ready Transfer Foam, 4x5 (in/in) Discharge Instruction: Apply Hydrofera Blue Ready to wound bed as directed Secondary Dressing Allevyn Adhesive Foam Sacrum Dressing, 6.75x6.75 (in/in) Secured With Compression Wrap Compression Stockings Add-Ons Electronic Signature(s) Signed: 01/25/2021 4:08:17 PM By: Donnamarie Poag Entered By: Donnamarie Poag on 01/25/2021 15:43:44 Mikhail, Joyice Faster ([id number]) -------------------------------------------------------------------------------- [place] Details Patient Name: Kyle Spence. Date of Service: 01/25/2021 3:15 PM Medical Record Number: [id number] Patient Account Number: [id number] Date of Birth/Sex: 03-May-1932 (87 y.o. M) Treating RN: Donnamarie Poag Primary Care Mirza Fessel: Dion Body Other Clinician: Referring Shomari Scicchitano: Dion Body Treating Trinidy Masterson/Extender: Tito Dine in Treatment: 2 Vital Signs Time Taken: 15:37 Temperature (F): 98.4 Height (in): 70 Pulse (bpm): 90 Weight (lbs): 217 Respiratory Rate (breaths/min): 16 Body Mass Index (BMI): 31.1 Blood Pressure (mmHg): 161/83 Reference Range: 80 - 120 mg / dl Electronic Signature(s) Signed: 01/25/2021 4:08:17 PM By: Donnamarie Poag Entered ByDonnamarie Poag on 01/25/2021 15:37:33

## 2021-01-25 NOTE — Progress Notes (Signed)
ASHAD, IMEL ([id number]) Visit Report for 01/25/2021 Debridement Details Patient Name: Kyle Spence, Kyle Spence. Date of Service: 01/25/2021 3:15 PM Medical Record Number: [id number] Patient Account Number: [id number] Date of Birth/Sex: 01-06-1933 (87 y.o. M) Treating RN: Donnamarie Poag Primary Care Provider: Dion Body Other Clinician: Referring Provider: Dion Body Treating Provider/Extender: Tito Dine in Treatment: 2 Debridement Performed for Wound #1 Sacrum Assessment: Performed By: Physician Ricard Dillon, MD Debridement Type: Debridement Level of Consciousness (Pre- Awake and Alert procedure): Pre-procedure Verification/Time Out Yes - 15:47 Taken: Start Time: 15:48 Pain Control: Lidocaine Total Area Debrided (L x W): 2.5 (cm) x 5 (cm) = 12.5 (cm) Tissue and other material Viable, Subcutaneous debrided: Level: Skin/Subcutaneous Tissue Debridement Description: Excisional Instrument: Curette Bleeding: Minimum Hemostasis Achieved: Pressure Response to Treatment: Procedure was tolerated well Level of Consciousness (Post- Awake and Alert procedure): Post Debridement Measurements of Total Wound Length: (cm) 2.5 Stage: Unstageable/Unclassified Width: (cm) 5 Depth: (cm) 0.1 Volume: (cm) 0.982 Character of Wound/Ulcer Post Debridement: Improved Post Procedure Diagnosis Same as Pre-procedure Electronic Signature(s) Signed: 01/25/2021 4:08:17 PM By: Donnamarie Poag Signed: 01/25/2021 4:08:28 PM By: Linton Ham MD Entered By: Linton Ham on 01/25/2021 15:55:14 Kyle Spence ([id number]) -------------------------------------------------------------------------------- HPI Details Patient Name: Kyle Spence. Date of Service: 01/25/2021 3:15 PM Medical Record Number: [id number] Patient Account Number: [id number] Date of Birth/Sex: 05-16-1932 (87 y.o. M) Treating RN: Donnamarie Poag Primary Care Provider: Dion Body Other  Clinician: Referring Provider: Dion Body Treating Provider/Extender: Tito Dine in Treatment: 2 History of Present Illness HPI Description: 01/08/2021 upon inspection today patient presents for initial inspection here in the clinic concerning a significant wound over the sacral area. Unfortunately this is something that began following a fractured hip that occurred just before Thanksgiving 2022. Subsequently the patient was in the hospital had this. And then [facility] physical therapy. The surgeon wanted this done 5 days a week instead reason he had to be inpatient at a skilled nursing facility for this. With that being said he unfortunately during the time in the skilled nursing facility developed the ulceration in the sacral region which initially was very leathery and black for what I am being told the wound care physician that comes into the facility there actually took care of this quite well based on what. He scored as a been using Santyl that loosen things up quite a bit. With that being said right now it appears to be very moist and that is one of my biggest concerns to be perfectly honest. I also think that the other issue is keeping pressure off which I am not sure is happening quite as effectively as it should right now. His son is living with him to help take care of him and has also been doing what he can as far as dressing changes or otherwise. The patient does have a history of hypertension, generalized weakness, dementia, and chronic kidney disease stage III. 01/25/2021; this is a patient with a pressure ulcer on his lower sacrum in a butterfly distribution extending into his proximal buttocks. The surface of this I think is improving we have been using Hydrofera Blue. He is being cared for at home heroically by his son. He has an air mattress, Museum/gallery curator) Signed: 01/25/2021 4:08:28 PM By:  Linton Ham MD Entered By: Linton Ham on 01/25/2021 15:52:29 Kyle Spence ([id number]) -------------------------------------------------------------------------------- Physical Exam Details Patient Name: Kyle Spence, Kyle Spence. Date of Service: 01/25/2021 3:15  PM Medical Record Number: [id number] Patient Account Number: [id number] Date of Birth/Sex: [birth year] (88 y.o. M) Treating RN: Donnamarie Poag Primary Care Provider: Dion Body Other Clinician: Referring Provider: Dion Body Treating Provider/Extender: Tito Dine in Treatment: 2 Constitutional Patient is hypertensive.. Pulse regular and within target range for patient.Marland Kitchen Respirations regular, non-labored and within target range.. Temperature is normal and within the target range for the patient.Marland Kitchen appears in no distress. Notes Wound exam; although the surface of this is somewhat improved still a very gritty fibrinous debris. I used a #5 curette to remove as much of this as I could subcutaneous debridement hemostasis with direct pressure. There is no evidence of surrounding infection. This does not probe to deep structures Electronic Signature(s) Signed: 01/25/2021 4:08:28 PM By: Linton Ham MD Entered By: Linton Ham on 01/25/2021 15:53:27 Kyle Spence ([id number]) -------------------------------------------------------------------------------- Physician Orders Details Patient Name: Kyle Spence Date of Service: 01/25/2021 3:15 PM Medical Record Number: [id number] Patient Account Number: [id number] Date of Birth/Sex: 1932-05-13 (87 y.o. M) Treating RN: Donnamarie Poag Primary Care Provider: Dion Body Other Clinician: Referring Provider: Dion Body Treating Provider/Extender: Tito Dine in Treatment: 2 Verbal / Phone Orders: No Diagnosis Coding ICD-10 Coding Code Description L89.150 Pressure ulcer of sacral region, unstageable M62.81 Muscle weakness  (generalized) I10 Essential (primary) hypertension N18.30 Chronic kidney disease, stage 3 unspecified Follow-up Appointments o Return Appointment in 2 weeks. [place] for wound care. May utilize formulary equivalent dressing for wound treatment orders unless otherwise specified. Home Health Nurse may visit [facility] to address patientos wound care needs. Kathaleen Bury phone [phone] o Scheduled days for dressing changes to be completed; exception, patient has scheduled wound care visit that day. o **Please direct any NON-WOUND related issues/requests for orders to patient's Primary Care Physician. **If current dressing causes regression in wound condition, may D/C ordered dressing product/s and apply Normal Saline Moist Dressing daily until next [facility] or Other MD appointment. **Notify [facility] of regression in wound condition at [phone]. Bathing/ Shower/ Hygiene o Clean wound with Normal Saline or wound cleanser. o No tub bath. Anesthetic (Use 'Patient Medications' Section for Anesthetic Order Entry) o Lidocaine applied to wound bed Off-Loading o Roho cushion for wheelchair o Hospital bed/mattress o Low air-loss mattress (Group 2) o Turn and reposition every 2 hours Additional Orders / Instructions o Follow Nutritious Diet and Increase Protein Intake Wound Treatment Wound #1 - Sacrum Cleanser: Normal Saline 1 x Per Day/30 Days Discharge Instructions: Wash your hands with soap and water. Remove old dressing, discard into plastic bag and place into trash. Cleanse the wound with Normal Saline prior to applying a clean dressing using gauze sponges, not tissues or cotton balls. Do not scrub or use excessive force. Pat dry using gauze sponges, not tissue or cotton balls. Cleanser: Wound Cleanser (Home Health) 1 x Per Day/30 Days Discharge Instructions: Wash your hands with soap and water. Remove  old dressing, discard into plastic bag and place into trash. Cleanse the wound with Wound Cleanser prior to applying a clean dressing using gauze sponges, not tissues or cotton balls. Do not scrub or use excessive force. Pat dry using gauze sponges, not tissue or cotton balls. Primary Dressing: Hydrofera Blue Ready Transfer Foam, 4x5 (in/in) (Home Health) 1 x Per Day/30 Days Discharge Instructions: Apply Hydrofera Blue Ready to wound bed as directed Secondary Dressing: Allevyn Adhesive Foam Sacrum Dressing, 6.75x6.75 (in/in) 1 x Per Day/30 Days [place], [place] (  [id number]) Electronic Signature(s) Signed: 01/25/2021 4:08:17 PM By: Hansel Feinstein Signed: 01/25/2021 4:08:28 PM By: Baltazar Najjar MD Entered By: Hansel Feinstein on 01/25/2021 15:58:08 Kyle Spence ([id number]) -------------------------------------------------------------------------------- Problem List Details Patient Name: Kyle Spence, Kyle Spence. Date of Service: 01/25/2021 3:15 PM Medical Record Number: [id number] Patient Account Number: [id number] Date of Birth/Sex: April 22, 1932 (87 y.o. M) Treating RN: Hansel Feinstein Primary Care Provider: Marisue Ivan Other Clinician: Referring Provider: Marisue Ivan Treating Provider/Extender: Altamese [place] in Treatment: 2 Active Problems ICD-10 Encounter Code Description Active Date MDM Diagnosis L89.150 Pressure ulcer of sacral region, unstageable 01/08/2021 No Yes M62.81 Muscle weakness (generalized) 01/08/2021 No Yes I10 Essential (primary) hypertension 01/08/2021 No Yes N18.30 Chronic kidney disease, stage 3 unspecified 01/08/2021 No Yes Inactive Problems Resolved Problems Electronic Signature(s) Signed: 01/25/2021 4:08:28 PM By: Baltazar Najjar MD Entered By: Baltazar Najjar on 01/25/2021 15:51:12 Kyle Spence ([id number]) -------------------------------------------------------------------------------- Progress Note Details Patient Name: Kyle Spence. Date of Service: 01/25/2021 3:15 PM Medical Record Number: [id number] Patient Account Number: [id number] Date of Birth/Sex: 1932-10-23 (87 y.o. M) Treating RN: Hansel Feinstein Primary Care Provider: Marisue Ivan Other Clinician: Referring Provider: Marisue Ivan Treating Provider/Extender: Altamese [place] in Treatment: 2 Subjective History of Present Illness (HPI) 01/08/2021 upon inspection today patient presents for initial inspection here in the clinic concerning a significant wound over the sacral area. Unfortunately this is something that began following a fractured hip that occurred just before Thanksgiving 2022. Subsequently the patient was in the hospital had this. And then went [facility] for [facility] and physical therapy. The surgeon wanted this done 5 days a week instead reason he had to be inpatient at a skilled nursing facility for this. With that being said he unfortunately during the time in the skilled nursing facility developed the ulceration in the sacral region which initially was very leathery and black for what I am being told the wound care physician that comes into the facility there actually took care of this quite well based on what. He scored as a been using Santyl that loosen things up quite a bit. With that being said right now it appears to be very moist and that is one of my biggest concerns to be perfectly honest. I also think that the other issue is keeping pressure off which I am not sure is happening quite as effectively as it should right now. His son is living with him to help take care of him and has also been doing what he can as far as dressing changes or otherwise. The patient does have a history of hypertension, generalized weakness, dementia, and chronic kidney disease stage III. 01/25/2021; this is a patient with a pressure ulcer on his lower sacrum in a butterfly distribution extending into his proximal buttocks. The  surface of this I think is improving we have been using Hydrofera Blue. He is being cared for at home heroically by his son. He has an air mattress, Roho cushion etc. Objective Constitutional Patient is hypertensive.. Pulse regular and within target range for patient.Marland Kitchen Respirations regular, non-labored and within target range.. Temperature is normal and within the target range for the patient.Marland Kitchen appears in no distress. Vitals Time Taken: 3:37 PM, Height: 70 in, Weight: 217 lbs, BMI: 31.1, Temperature: 98.4 F, Pulse: 90 bpm, Respiratory Rate: 16 breaths/min, Blood Pressure: 161/83 mmHg. General Notes: Wound exam; although the surface of this is somewhat improved still a very gritty fibrinous debris. I used a #5 curette to remove as much  of this as I could subcutaneous debridement hemostasis with direct pressure. There is no evidence of surrounding infection. This does not probe to deep structures Integumentary (Hair, Skin) Wound #1 status is Open. Original cause of wound was Gradually Appeared. The date acquired was: 12/23/2020. The wound has been in treatment 2 weeks. The wound is located on the Sacrum. The wound measures 2.5cm length x 5cm width x 0.1cm depth; 9.817cm^2 area and 0.982cm^3 volume. There is Fat Layer (Subcutaneous Tissue) exposed. There is no tunneling or undermining noted. There is a medium amount of serosanguineous drainage noted. There is small (1-33%) red, pink granulation within the wound bed. There is a large (67-100%) amount of necrotic tissue within the wound bed including Adherent Slough. Assessment Active Problems ICD-10 Pressure ulcer of sacral region, unstageable Muscle weakness (generalized) Essential (primary) hypertension Chronic kidney disease, stage 3 unspecified Kyle Spence, Kyle L. ([id number]) Procedures Wound #1 Pre-procedure diagnosis of Wound #1 is a Pressure Ulcer located on the Sacrum . There was a Excisional Skin/Subcutaneous Tissue Debridement with  a total area of 12.5 sq cm performed by Ricard Dillon, MD. With the following instrument(s): Curette to remove Viable tissue/material. Material removed includes Subcutaneous Tissue after achieving pain control using Lidocaine. A time out was conducted at 15:47, prior to the start of the procedure. A Minimum amount of bleeding was controlled with Pressure. The procedure was tolerated well. Post Debridement Measurements: 2.5cm length x 5cm width x 0.1cm depth; 0.982cm^3 volume. Post debridement Stage noted as Unstageable/Unclassified. Character of Wound/Ulcer Post Debridement is improved. Post procedure Diagnosis Wound #1: Same as Pre-Procedure Plan 1. I continued with the [facility] based dressing. They are changing this every other day but sometimes daily due to soiling 2. I went over pressure relief with the son he seems well versed in this. This is paramount to any degree of closure 3. He is likely to require additional debridements Electronic Signature(s) Signed: 01/25/2021 4:08:28 PM By: Linton Ham MD Entered By: Linton Ham on 01/25/2021 15:54:24 Kyle Spence ([id number]) -------------------------------------------------------------------------------- [place] Details Patient Name: Kyle Spence Date of Service: 01/25/2021 Medical Record Number: [id number] Patient Account Number: [id number] Date of Birth/Sex: [birth year] (87 y.o. M) Treating RN: Donnamarie Poag Primary Care Provider: Dion Body Other Clinician: Referring Provider: Dion Body Treating Provider/Extender: Tito Dine in Treatment: 2 Diagnosis Coding ICD-10 Codes Code Description L89.150 Pressure ulcer of sacral region, unstageable M62.81 Muscle weakness (generalized) I10 Essential (primary) hypertension N18.30 Chronic kidney disease, stage 3 unspecified Facility Procedures CPT4 Code: [id number] Description: [id number] - DEB SUBQ TISSUE 20 SQ CM/< Modifier: Quantity:  1 CPT4 Code: Description: ICD-10 Diagnosis Description L89.150 Pressure ulcer of sacral region, unstageable Modifier: Quantity: Physician Procedures CPT4 Code: [id number] Description: 11042 - WC PHYS SUBQ TISS 20 SQ CM Modifier: Quantity: 1 CPT4 Code: Description: ICD-10 Diagnosis Description L89.150 Pressure ulcer of sacral region, unstageable Modifier: Quantity: Electronic Signature(s) Signed: 01/25/2021 4:08:28 PM By: Linton Ham MD Entered By: Linton Ham on 01/25/2021 15:54:38

## 2021-02-05 ENCOUNTER — Other Ambulatory Visit: Payer: Self-pay

## 2021-02-05 ENCOUNTER — Encounter: Payer: Medicare Other | Attending: Physician Assistant | Admitting: Physician Assistant

## 2021-02-05 DIAGNOSIS — N183 Chronic kidney disease, stage 3 unspecified: Secondary | ICD-10-CM | POA: Insufficient documentation

## 2021-02-05 DIAGNOSIS — L89150 Pressure ulcer of sacral region, unstageable: Secondary | ICD-10-CM | POA: Insufficient documentation

## 2021-02-05 DIAGNOSIS — F0390 Unspecified dementia without behavioral disturbance: Secondary | ICD-10-CM | POA: Diagnosis not present

## 2021-02-05 DIAGNOSIS — I129 Hypertensive chronic kidney disease with stage 1 through stage 4 chronic kidney disease, or unspecified chronic kidney disease: Secondary | ICD-10-CM | POA: Insufficient documentation

## 2021-02-05 NOTE — Progress Notes (Addendum)
DMIR, RIEF ([id number]) Visit Report for 02/05/2021 Chief Complaint Document Details Patient Name: Kyle Spence, Kyle Spence. Date of Service: 02/05/2021 3:15 PM Medical Record Number: [id number] Patient Account Number: [id number] Date of Birth/Sex: [birth year] (88 y.o. M) Treating RN: Cornell Barman Primary Care Provider: Dion Body Other Clinician: Referring Provider: Dion Body Treating Provider/Extender: Skipper Cliche in Treatment: 4 Information Obtained from: Patient Chief Complaint Pressure ulcer sacral region Electronic Signature(s) Signed: 02/05/2021 3:49:33 PM By: Worthy Keeler PA-C Entered By: Worthy Keeler on 02/05/2021 15:49:33 Kyle Spence ([id number]) -------------------------------------------------------------------------------- HPI Details Patient Name: Kyle Spence Date of Service: 02/05/2021 3:15 PM Medical Record Number: [id number] Patient Account Number: [id number] Date of Birth/Sex: 22-Oct-1932 (88 y.o. M) Treating RN: Cornell Barman Primary Care Provider: Dion Body Other Clinician: Referring Provider: Dion Body Treating Provider/Extender: Skipper Cliche in Treatment: 4 History of Present Illness HPI Description: 01/08/2021 upon inspection today patient presents for initial inspection here in the clinic concerning a significant wound over the sacral area. Unfortunately this is something that began following a fractured hip that occurred just before Thanksgiving 2022. Subsequently the patient was in the hospital had this. And then [facility] for [facility] and physical therapy. The surgeon wanted this done 5 days a week instead reason he had to be inpatient at a skilled nursing facility for this. With that being said he unfortunately during the time in the skilled nursing facility developed the ulceration in the sacral region which initially was very leathery and black for what I am being told the wound care  physician that comes into the facility there actually took care of this quite well based on what. He scored as a been using Santyl that loosen things up quite a bit. With that being said right now it appears to be very moist and that is one of my biggest concerns to be perfectly honest. I also think that the other issue is keeping pressure off which I am not sure is happening quite as effectively as it should right now. His son is living with him to help take care of him and has also been doing what he can as far as dressing changes or otherwise. The patient does have a history of hypertension, generalized weakness, dementia, and chronic kidney disease stage III. 01/25/2021; this is a patient with a pressure ulcer on his lower sacrum in a butterfly distribution extending into his proximal buttocks. The surface of this I think is improving we have been using Hydrofera Blue. He is being cared for at home heroically by his son. He has an air mattress, Roho cushion etc. 02/05/2021 upon evaluation today patient appears to be doing well with regard to his wound. He has been tolerating the dressing changes without complication. Fortunately I think that the sacral area is doing much better the [facility] seems to be doing a great job. Electronic Signature(s) Signed: 02/05/2021 4:09:30 PM By: Worthy Keeler PA-C Entered By: Worthy Keeler on 02/05/2021 16:09:29 Kyle Spence ([id number]) -------------------------------------------------------------------------------- Physical Exam Details Patient Name: Kyle Spence, Kyle Spence. Date of Service: 02/05/2021 3:15 PM Medical Record Number: [id number] Patient Account Number: [id number] Date of Birth/Sex: [birth year] (88 y.o. M) Treating RN: Cornell Barman Primary Care Provider: Dion Body Other Clinician: Referring Provider: Dion Body Treating Provider/Extender: Skipper Cliche in Treatment: 4 Constitutional Well-nourished and well-hydrated in  no acute distress. Respiratory normal breathing without difficulty. Psychiatric this patient is able to make decisions and demonstrates good insight into disease process.  Alert and Oriented x 3. pleasant and cooperative. Notes Patient's wound bed showed signs of good granulation epithelization overall I feel like that he is making excellent progress but I think the Hydrofera Blue followed by the border foam dressing has been doing awesome for him. I do think that we are on the right track and hopefully he will continue seeing the improvement that we are seeing now over the next several weeks to get this closed. Electronic Signature(s) Signed: 02/05/2021 4:09:51 PM By: Worthy Keeler PA-C Entered By: Worthy Keeler on 02/05/2021 16:09:51 Kyle Spence ([id number]) -------------------------------------------------------------------------------- Physician Orders Details Patient Name: Kyle Spence Date of Service: 02/05/2021 3:15 PM Medical Record Number: [id number] Patient Account Number: [id number] Date of Birth/Sex: 1932-07-05 (88 y.o. M) Treating RN: Cornell Barman Primary Care Provider: Dion Body Other Clinician: Referring Provider: Dion Body Treating Provider/Extender: Skipper Cliche in Treatment: 4 Verbal / Phone Orders: No Diagnosis Coding ICD-10 Coding Code Description L89.150 Pressure ulcer of sacral region, unstageable M62.81 Muscle weakness (generalized) I10 Essential (primary) hypertension N18.30 Chronic kidney disease, stage 3 unspecified Follow-up Appointments o Return Appointment in 2 weeks. [facility] [place] for wound care. May utilize formulary equivalent dressing for wound treatment orders unless otherwise specified. Home Health Nurse may visit PRN to address patientos wound care needs. Kathaleen Bury phone [phone] o Scheduled days for dressing changes to be completed; exception, patient has  scheduled wound care visit that day. o **Please direct any NON-WOUND related issues/requests for orders to patient's Primary Care Physician. **If current dressing causes regression in wound condition, may D/C ordered dressing product/s and apply Normal Saline Moist Dressing daily until next [place] or Other MD appointment. **Notify [facility] of regression in wound condition at [phone]. Bathing/ Shower/ Hygiene o Clean wound with Normal Saline or wound cleanser. o No tub bath. Off-Loading o Roho cushion for wheelchair o Hospital bed/mattress o Low air-loss mattress (Group 2) o Turn and reposition every 2 hours Additional Orders / Instructions o Follow Nutritious Diet and Increase Protein Intake Wound Treatment Wound #1 - Sacrum Cleanser: Normal Saline 1 x Per Day/30 Days Discharge Instructions: Wash your hands with soap and water. Remove old dressing, discard into plastic bag and place into trash. Cleanse the wound with Normal Saline prior to applying a clean dressing using gauze sponges, not tissues or cotton balls. Do not scrub or use excessive force. Pat dry using gauze sponges, not tissue or cotton balls. Cleanser: Wound Cleanser (Home Health) 1 x Per Day/30 Days Discharge Instructions: Wash your hands with soap and water. Remove old dressing, discard into plastic bag and place into trash. Cleanse the wound with Wound Cleanser prior to applying a clean dressing using gauze sponges, not tissues or cotton balls. Do not scrub or use excessive force. Pat dry using gauze sponges, not tissue or cotton balls. Peri-Wound Care: AandD Ointment ([place]) 1 x Per Day/30 Days Discharge Instructions: Apply AandD Ointment as directed Primary Dressing: Hydrofera Blue Ready Transfer Foam, 4x5 (in/in) (Home Health) 1 x Per Day/30 Days Discharge Instructions: Apply Hydrofera Blue Ready to wound bed as directed Secondary Dressing: Allevyn Adhesive Foam  Sacrum Dressing, 6.75x6.75 (in/in) ([place]) (Generic) 1 x Per Day/30 Days Kyle Spence, Kyle Spence ([id number]) Electronic Signature(s) Signed: 02/05/2021 4:21:45 PM By: Gretta Cool, BSN, RN, CWS, Kim RN, BSN Signed: 02/06/2021 12:22:51 PM By: Worthy Keeler PA-C Entered By: Gretta Cool, BSN, RN, CWS, Kim on 02/05/2021 16:21:45 Kyle Spence ([id number]) -------------------------------------------------------------------------------- Problem  List Details Patient Name: Kyle Spence, Kyle Spence. Date of Service: 02/05/2021 3:15 PM Medical Record Number: [id number] Patient Account Number: [id number] Date of Birth/Sex: 1932/07/24 (88 y.o. M) Treating RN: Cornell Barman Primary Care Provider: Dion Body Other Clinician: Referring Provider: Dion Body Treating Provider/Extender: Skipper Cliche in Treatment: 4 Active Problems ICD-10 Encounter Code Description Active Date MDM Diagnosis L89.150 Pressure ulcer of sacral region, unstageable 01/08/2021 No Yes M62.81 Muscle weakness (generalized) 01/08/2021 No Yes I10 Essential (primary) hypertension 01/08/2021 No Yes N18.30 Chronic kidney disease, stage 3 unspecified 01/08/2021 No Yes Inactive Problems Resolved Problems Electronic Signature(s) Signed: 02/05/2021 3:41:20 PM By: Worthy Keeler PA-C Entered By: Worthy Keeler on 02/05/2021 15:41:19 Kyle Spence ([id number]) -------------------------------------------------------------------------------- Progress Note Details Patient Name: Kyle Spence. Date of Service: 02/05/2021 3:15 PM Medical Record Number: [id number] Patient Account Number: [id number] Date of Birth/Sex: Jun 04, 1932 (88 y.o. M) Treating RN: Cornell Barman Primary Care Provider: Dion Body Other Clinician: Referring Provider: Dion Body Treating Provider/Extender: Skipper Cliche in Treatment: 4 Subjective Chief Complaint Information obtained from Patient Pressure ulcer sacral region History of Present  Illness (HPI) 01/08/2021 upon inspection today patient presents for initial inspection here in the clinic concerning a significant wound over the sacral area. Unfortunately this is something that began following a fractured hip that occurred just before Thanksgiving 2022. Subsequently the patient was in the hospital had this. And then [facility] for [facility] and physical therapy. The surgeon wanted this done 5 days a week instead reason he had to be inpatient at a skilled nursing facility for this. With that being said he unfortunately during the time in the skilled nursing facility developed the ulceration in the sacral region which initially was very leathery and black for what I am being told the wound care physician that comes into the facility there actually took care of this quite well based on what. He scored as a been using Santyl that loosen things up quite a bit. With that being said right now it appears to be very moist and that is one of my biggest concerns to be perfectly honest. I also think that the other issue is keeping pressure off which I am not sure is happening quite as effectively as it should right now. His son is living with him to help take care of him and has also been doing what he can as far as dressing changes or otherwise. The patient does have a history of hypertension, generalized weakness, dementia, and chronic kidney disease stage III. 01/25/2021; this is a patient with a pressure ulcer on his lower sacrum in a butterfly distribution extending into his proximal buttocks. The surface of this I think is improving we have been using Hydrofera Blue. He is being cared for at home heroically by his son. He has an air mattress, Roho cushion etc. 02/05/2021 upon evaluation today patient appears to be doing well with regard to his wound. He has been tolerating the dressing changes without complication. Fortunately I think that the sacral area is doing much  better the [facility] seems to be doing a great job. Objective Constitutional Well-nourished and well-hydrated in no acute distress. Vitals Time Taken: 3:36 PM, Height: 70 in, Weight: 217 lbs, BMI: 31.1, Temperature: 97.4 F, Pulse: 81 bpm, Respiratory Rate: 16 breaths/min, Blood Pressure: 133/75 mmHg. Respiratory normal breathing without difficulty. Psychiatric this patient is able to make decisions and demonstrates good insight into disease process. Alert and Oriented x 3. pleasant and cooperative. General  Notes: Patient's wound bed showed signs of good granulation epithelization overall I feel like that he is making excellent progress but I think the Hydrofera Blue followed by the border foam dressing has been doing awesome for him. I do think that we are on the right track and hopefully he will continue seeing the improvement that we are seeing now over the next several weeks to get this closed. Integumentary (Hair, Skin) Wound #1 status is Open. Original cause of wound was Gradually Appeared. The date acquired was: 12/23/2020. The wound has been in treatment 4 weeks. The wound is located on the Sacrum. The wound measures 2cm length x 5cm width x 0.1cm depth; 7.854cm^2 area and 0.785cm^3 volume. There is a medium amount of serosanguineous drainage noted. Assessment Kyle Spence, Kyle Spence ([id number]) Active Problems ICD-10 Pressure ulcer of sacral region, unstageable Muscle weakness (generalized) Essential (primary) hypertension Chronic kidney disease, stage 3 unspecified Plan 1. Would recommend that we going to continue with the [facility] but seems to be doing awesome. 2. I am also can recommend that we continue with the border foam dressing to cover which has been doing very well for him. We will see patient back for reevaluation in 1 week here in the clinic. If anything worsens or changes patient will contact our office for additional recommendations. Electronic  Signature(s) Signed: 02/05/2021 4:10:08 PM By: Worthy Keeler PA-C Entered By: Worthy Keeler on 02/05/2021 16:10:08 Kyle Spence ([id number]) -------------------------------------------------------------------------------- SuperBill Details Patient Name: Kyle Spence Date of Service: 02/05/2021 Medical Record Number: [id number] Patient Account Number: [id number] Date of Birth/Sex: Oct 01, 1932 (88 y.o. M) Treating RN: Cornell Barman Primary Care Provider: Dion Body Other Clinician: Referring Provider: Dion Body Treating Provider/Extender: Skipper Cliche in Treatment: 4 Diagnosis Coding ICD-10 Codes Code Description L89.150 Pressure ulcer of sacral region, unstageable M62.81 Muscle weakness (generalized) I10 Essential (primary) hypertension N18.30 Chronic kidney disease, stage 3 unspecified Facility Procedures CPT4 Code: [id number] Description: 99213 - WOUND CARE VISIT-LEV 3 EST PT Modifier: Quantity: 1 Physician Procedures CPT4 Code: [id number] Description: [id number] - WC PHYS LEVEL 3 - EST PT Modifier: Quantity: 1 CPT4 Code: Description: ICD-10 Diagnosis Description L89.150 Pressure ulcer of sacral region, unstageable M62.81 Muscle weakness (generalized) I10 Essential (primary) hypertension N18.30 Chronic kidney disease, stage 3 unspecified Modifier: Quantity: Electronic Signature(s) Signed: 02/05/2021 4:31:58 PM By: Gretta Cool, BSN, RN, CWS, Kim RN, BSN Signed: 02/06/2021 12:22:51 PM By: Worthy Keeler PA-C Previous Signature: 02/05/2021 4:12:46 PM Version By: Worthy Keeler PA-C Entered By: Gretta Cool, BSN, RN, CWS, Kim on 02/05/2021 16:31:57

## 2021-02-06 ENCOUNTER — Other Ambulatory Visit: Payer: Self-pay | Admitting: Physician Assistant

## 2021-02-06 DIAGNOSIS — R531 Weakness: Secondary | ICD-10-CM

## 2021-02-06 DIAGNOSIS — R29810 Facial weakness: Secondary | ICD-10-CM

## 2021-02-06 NOTE — Progress Notes (Signed)
Kyle Spence, Kyle Spence ([id number]) Visit Report for 02/05/2021 Arrival Information Details Patient Name: Kyle Spence, Kyle Spence. Date of Service: 02/05/2021 3:15 PM Medical Record Number: [id number] Patient Account Number: [id number] Date of Birth/Sex: 1932-05-16 (88 y.o. M) Treating RN: Cornell Barman Primary Care Teka Chanda: Dion Body Other Clinician: Referring Jaliya Siegmann: Dion Body Treating Klea Nall/Extender: Skipper Cliche in Treatment: 4 Visit Information History Since Last Visit Has Dressing in Place as Prescribed: Yes Patient Arrived: Wheel Chair Pain Present Now: Yes Arrival Time: 15:45 Accompanied By: self Transfer Assistance: None Patient Identification Verified: Yes Secondary Verification Process Completed: Yes Patient Requires Transmission-Based Precautions: No Patient Has Alerts: No Electronic Signature(s) Signed: 02/06/2021 5:35:56 PM By: Gretta Cool, BSN, RN, CWS, Kim RN, BSN Entered By: Gretta Cool, BSN, RN, CWS, Kim on 02/05/2021 15:46:23 Kyle Spence ([id number]) -------------------------------------------------------------------------------- Clinic Level of Care Assessment Details Patient Name: Kyle Spence. Date of Service: 02/05/2021 3:15 PM Medical Record Number: [id number] Patient Account Number: [id number] Date of Birth/Sex: [birth year] (88 y.o. M) Treating RN: Cornell Barman Primary Care Clydell Sposito: Dion Body Other Clinician: Referring Stavros Cail: Dion Body Treating Keamber Macfadden/Extender: Skipper Cliche in Treatment: 4 Clinic Level of Care Assessment Items TOOL 4 Quantity Score '[]'  - Use when only an EandM is performed on FOLLOW-UP visit 0 ASSESSMENTS - Nursing Assessment / Reassessment X - Reassessment of Co-morbidities (includes updates in patient status) 1 10 X- 1 5 Reassessment of Adherence to Treatment Plan ASSESSMENTS - Wound and Skin Assessment / Reassessment X - Simple Wound Assessment / Reassessment - one wound 1 5 '[]'  - 0 Complex Wound  Assessment / Reassessment - multiple wounds '[]'  - 0 Dermatologic / Skin Assessment (not related to wound area) ASSESSMENTS - Focused Assessment '[]'  - Circumferential Edema Measurements - multi extremities 0 '[]'  - 0 Nutritional Assessment / Counseling / Intervention '[]'  - 0 Lower Extremity Assessment (monofilament, tuning fork, pulses) '[]'  - 0 Peripheral Arterial Disease Assessment (using hand held doppler) ASSESSMENTS - Ostomy and/or Continence Assessment and Care '[]'  - Incontinence Assessment and Management 0 '[]'  - 0 Ostomy Care Assessment and Management (repouching, etc.) PROCESS - Coordination of Care X - Simple Patient / Family Education for ongoing care 1 15 '[]'  - 0 Complex (extensive) Patient / Family Education for ongoing care X- 1 10 Staff obtains Programmer, systems, Records, Test Results / Process Orders '[]'  - 0 Staff telephones HHA, Nursing Homes / Clarify orders / etc '[]'  - 0 Routine Transfer to another Facility (non-emergent condition) '[]'  - 0 Routine Hospital Admission (non-emergent condition) '[]'  - 0 New Admissions / Biomedical engineer / Ordering NPWT, Apligraf, etc. '[]'  - 0 Emergency Hospital Admission (emergent condition) X- 1 10 Simple Discharge Coordination '[]'  - 0 Complex (extensive) Discharge Coordination PROCESS - Special Needs '[]'  - Pediatric / Minor Patient Management 0 '[]'  - 0 Isolation Patient Management '[]'  - 0 Hearing / Language / Visual special needs '[]'  - 0 Assessment of Community assistance (transportation, D/C planning, etc.) '[]'  - 0 Additional assistance / Altered mentation '[]'  - 0 Support Surface(s) Assessment (bed, cushion, seat, etc.) INTERVENTIONS - Wound Cleansing / Measurement Kyle Spence, Kyle L. ([id number]) X- 1 5 Simple Wound Cleansing - one wound '[]'  - 0 Complex Wound Cleansing - multiple wounds X- 1 5 Wound Imaging (photographs - any number of wounds) '[]'  - 0 Wound Tracing (instead of photographs) X- 1 5 Simple Wound Measurement - one wound '[]'   - 0 Complex Wound Measurement - multiple wounds INTERVENTIONS - Wound Dressings '[]'  - Small Wound Dressing one or multiple wounds 0 X- 1 15  Medium Wound Dressing one or multiple wounds '[]'  - 0 Large Wound Dressing one or multiple wounds '[]'  - 0 Application of Medications - topical '[]'  - 0 Application of Medications - injection INTERVENTIONS - Miscellaneous '[]'  - External ear exam 0 '[]'  - 0 Specimen Collection (cultures, biopsies, blood, body fluids, etc.) '[]'  - 0 Specimen(s) / Culture(s) sent or taken to Lab for analysis '[]'  - 0 Patient Transfer (multiple staff / Civil Service fast streamer / Similar devices) '[]'  - 0 Simple Staple / Suture removal (25 or less) '[]'  - 0 Complex Staple / Suture removal (26 or more) '[]'  - 0 Hypo / Hyperglycemic Management (close monitor of Blood Glucose) '[]'  - 0 Ankle / Brachial Index (ABI) - do not check if billed separately X- 1 5 Vital Signs Has the patient been seen at the hospital within the last three years: Yes Total Score: 90 Level Of Care: New/Established - Level 3 Electronic Signature(s) Signed: 02/06/2021 5:35:56 PM By: Gretta Cool, BSN, RN, CWS, Kim RN, BSN Entered By: Gretta Cool, BSN, RN, CWS, Kim on 02/05/2021 16:24:49 Kyle Spence ([id number]) -------------------------------------------------------------------------------- Encounter Discharge Information Details Patient Name: Kyle Spence. Date of Service: 02/05/2021 3:15 PM Medical Record Number: [id number] Patient Account Number: [id number] Date of Birth/Sex: May 26, 1932 (88 y.o. M) Treating RN: Cornell Barman Primary Care Rydge Texidor: Dion Body Other Clinician: Referring Alisa Stjames: Dion Body Treating Yasheka Fossett/Extender: Skipper Cliche in Treatment: 4 Encounter Discharge Information Items Discharge Condition: Stable Ambulatory Status: Ambulatory Discharge Destination: Home Transportation: Private Auto Accompanied By: self Schedule Follow-up Appointment: Yes Clinical Summary of  Care: Electronic Signature(s) Signed: 02/05/2021 4:26:24 PM By: Gretta Cool, BSN, RN, CWS, Kim RN, BSN Entered By: Gretta Cool, BSN, RN, CWS, Kim on 02/05/2021 98:92:11 Kyle Spence ([id number]) -------------------------------------------------------------------------------- Lower Extremity Assessment Details Patient Name: Kyle Spence, Kyle Spence. Date of Service: 02/05/2021 3:15 PM Medical Record Number: [id number] Patient Account Number: [id number] Date of Birth/Sex: 11-03-1932 (88 y.o. M) Treating RN: Cornell Barman Primary Care Herrick Hartog: Dion Body Other Clinician: Referring Rhyse Loux: Dion Body Treating Liadan Guizar/Extender: Skipper Cliche in Treatment: 4 Electronic Signature(s) Signed: 02/06/2021 5:35:56 PM By: Gretta Cool, BSN, RN, CWS, Kim RN, BSN Entered By: Gretta Cool, BSN, RN, CWS, Kim on 02/05/2021 16:12:39 Kyle Spence ([id number]) -------------------------------------------------------------------------------- Multi Wound Chart Details Patient Name: Kyle Spence, Kyle Spence. Date of Service: 02/05/2021 3:15 PM Medical Record Number: [id number] Patient Account Number: [id number] Date of Birth/Sex: January 08, 1933 (88 y.o. M) Treating RN: Cornell Barman Primary Care Ardythe Klute: Dion Body Other Clinician: Referring Naquisha Whitehair: Dion Body Treating Tayden Nichelson/Extender: Skipper Cliche in Treatment: 4 Vital Signs Height(in): 70 Pulse(bpm): 81 Weight(lbs): 217 Blood Pressure(mmHg): 133/75 Body Mass Index(BMI): 31 Temperature(F): 97.4 Respiratory Rate(breaths/min): 16 Photos: [1:No Photos] [N/A:N/A] Wound Location: [1:Sacrum] [N/A:N/A] Wounding Event: [1:Gradually Appeared] [N/A:N/A] Primary Etiology: [1:Pressure Ulcer] [N/A:N/A] Date Acquired: [1:12/23/2020] [N/A:N/A] Weeks of Treatment: [1:4] [N/A:N/A] Wound Status: [1:Open] [N/A:N/A] Measurements L x W x D (cm) [1:2x5x0.1] [N/A:N/A] Area (cm) : [1:7.854] [N/A:N/A] Volume (cm) : [1:0.785] [N/A:N/A] % Reduction in Area:  [1:71.40%] [N/A:N/A] % Reduction in Volume: [1:71.40%] [N/A:N/A] Classification: [1:Unstageable/Unclassified] [N/A:N/A] Exudate Amount: [1:Medium] [N/A:N/A] Exudate Type: [1:Serosanguineous red, brown] [N/A:N/A N/A] Treatment Notes Electronic Signature(s) Signed: 02/05/2021 4:20:31 PM By: Gretta Cool, BSN, RN, CWS, Kim RN, BSN Entered By: Gretta Cool, BSN, RN, CWS, Kim on 02/05/2021 16:20:31 Kyle Spence ([id number]) -------------------------------------------------------------------------------- [place] Details Patient Name: Kyle Spence. Date of Service: 02/05/2021 3:15 PM Medical Record Number: [id number] Patient Account Number: [id number] Date of Birth/Sex: 1932-07-04 (88 y.o. M) Treating RN: Cornell Barman Primary Care Myer Bohlman: Dion Body Other Clinician:  Referring Eliav Mechling: Dion Body Treating Tecia Cinnamon/Extender: Skipper Cliche in Treatment: 4 Active Inactive Abuse / Safety / Falls / Self Care Management Nursing Diagnoses: Potential for injury related to falls Potential for injury related to transfers Self care deficit: actual or potential Goals: Patient/caregiver will verbalize understanding of skin care regimen Date Initiated: 01/08/2021 Date Inactivated: 01/25/2021 Target Resolution Date: 02/08/2021 Goal Status: Met Patient/caregiver will verbalize understanding of the importance to maintain current immunizations/vaccinations Date Initiated: 01/08/2021 Date Inactivated: 01/25/2021 Target Resolution Date: 02/08/2021 Goal Status: Met Patient/caregiver will verbalize/demonstrate measure taken to improve self care Date Initiated: 01/08/2021 Target Resolution Date: 03/11/2021 Goal Status: Active Patient/caregiver will verbalize/demonstrate measures taken to improve the patient's personal safety Date Initiated: 01/08/2021 Target Resolution Date: 04/08/2021 Goal Status: Active Patient/caregiver will verbalize/demonstrate measures taken to  prevent injury and/or falls Date Initiated: 01/08/2021 Target Resolution Date: 05/09/2021 Goal Status: Active Patient/caregiver will verbalize/demonstrate understanding of what to do in case of emergency Date Initiated: 01/08/2021 Target Resolution Date: 06/08/2021 Goal Status: Active Interventions: Assess fall risk on admission and as needed Assess: immobility, friction, shearing, incontinence upon admission and as needed Assess impairment of mobility on admission and as needed per policy Assess personal safety and home safety (as indicated) on admission and as needed Assess self care needs on admission and as needed Patient/Caregiver referred to community resources (specify in notes) Notes: Wound/Skin Impairment Nursing Diagnoses: Knowledge deficit related to ulceration/compromised skin integrity Goals: Patient/caregiver will verbalize understanding of skin care regimen Date Initiated: 01/08/2021 Target Resolution Date: 02/08/2021 Goal Status: Active Ulcer/skin breakdown will have a volume reduction of 30% by week 4 Date Initiated: 01/08/2021 Target Resolution Date: 03/11/2021 Goal Status: Active Ulcer/skin breakdown will have a volume reduction of 50% by week 8 Date Initiated: 01/08/2021 Target Resolution Date: 04/08/2021 Goal Status: Active Kyle Spence, Kyle Spence ([id number]) Ulcer/skin breakdown will have a volume reduction of 80% by week 12 Date Initiated: 01/08/2021 Target Resolution Date: 05/09/2021 Goal Status: Active Ulcer/skin breakdown will heal within 14 weeks Date Initiated: 01/08/2021 Target Resolution Date: 06/08/2021 Goal Status: Active Interventions: Assess patient/caregiver ability to obtain necessary supplies Assess patient/caregiver ability to perform ulcer/skin care regimen upon admission and as needed Assess ulceration(s) every visit Notes: Electronic Signature(s) Signed: 02/05/2021 4:20:24 PM By: Gretta Cool, BSN, RN, CWS, Kim RN, BSN Entered By: Gretta Cool, BSN, RN,  CWS, Kim on 02/05/2021 16:20:23 Kyle Spence ([id number]) -------------------------------------------------------------------------------- Pain Assessment Details Patient Name: Kyle Spence. Date of Service: 02/05/2021 3:15 PM Medical Record Number: [id number] Patient Account Number: [id number] Date of Birth/Sex: 12-29-1932 (88 y.o. M) Treating RN: Cornell Barman Primary Care Jaris Kohles: Dion Body Other Clinician: Referring Janavia Rottman: Dion Body Treating Suetta Hoffmeister/Extender: Skipper Cliche in Treatment: 4 Active Problems Location of Pain Severity and Description of Pain Patient Has Paino Yes Site Locations Pain Location: Pain in Ulcers Duration of the Pain. Constant / Intermittento Intermittent Pain Management and Medication Current Pain Management: Notes Patient states he has a little bit of pain. Electronic Signature(s) Signed: 02/06/2021 5:35:56 PM By: Gretta Cool, BSN, RN, CWS, Kim RN, BSN Entered By: Gretta Cool, BSN, RN, CWS, Kim on 02/05/2021 15:47:34 Kyle Spence ([id number]) -------------------------------------------------------------------------------- Patient/Caregiver Education Details Patient Name: Kyle Spence, Kyle Spence Date of Service: 02/05/2021 3:15 PM Medical Record Number: [id number] Patient Account Number: [id number] Date of Birth/Gender: September 10, 1932 (88 y.o. M) Treating RN: Cornell Barman Primary Care Physician: Dion Body Other Clinician: Referring Physician: Dion Body Treating Physician/Extender: Skipper Cliche in Treatment: 4 Education Assessment Education Provided To: Patient Education Topics Provided Wound/Skin Impairment: Handouts: Caring for Your  Ulcer Methods: Demonstration, Explain/Verbal Responses: State content correctly Electronic Signature(s) Signed: 02/06/2021 5:35:56 PM By: Gretta Cool, BSN, RN, CWS, Kim RN, BSN Entered By: Gretta Cool, BSN, RN, CWS, Kim on 02/05/2021 16:25:43 Kyle Spence  ([id number]) -------------------------------------------------------------------------------- Wound Assessment Details Patient Name: Kyle Spence, Kyle Spence. Date of Service: 02/05/2021 3:15 PM Medical Record Number: [id number] Patient Account Number: [id number] Date of Birth/Sex: 08/14/1932 (88 y.o. M) Treating RN: Cornell Barman Primary Care Roslynn Holte: Dion Body Other Clinician: Referring Barnard Sharps: Dion Body Treating Shriyans Kuenzi/Extender: Jeri Cos Weeks in Treatment: 4 Wound Status Wound Number: 1 Primary Etiology: Pressure Ulcer Wound Location: Sacrum Wound Status: Open Wounding Event: Gradually Appeared Date Acquired: 12/23/2020 Weeks Of Treatment: 4 Clustered Wound: No Photos Photo Uploaded By: Gretta Cool, BSN, RN, CWS, Kim on 02/05/2021 17:19:43 Wound Measurements Length: (cm) 2 Width: (cm) 5 Depth: (cm) 0.1 Area: (cm) 7.854 Volume: (cm) 0.785 % Reduction in Area: 71.4% % Reduction in Volume: 71.4% Wound Description Classification: Unstageable/Unclassified Exudate Amount: Medium Exudate Type: Serosanguineous Exudate Color: red, brown Treatment Notes Wound #1 (Sacrum) Cleanser Normal Saline Discharge Instruction: Wash your hands with soap and water. Remove old dressing, discard into plastic bag and place into trash. Cleanse the wound with Normal Saline prior to applying a clean dressing using gauze sponges, not tissues or cotton balls. Do not scrub or use excessive force. Pat dry using gauze sponges, not tissue or cotton balls. Wound Cleanser Discharge Instruction: Wash your hands with soap and water. Remove old dressing, discard into plastic bag and place into trash. Cleanse the wound with Wound Cleanser prior to applying a clean dressing using gauze sponges, not tissues or cotton balls. Do not scrub or use excessive force. Pat dry using gauze sponges, not tissue or cotton balls. Peri-Wound Care AandD Ointment Discharge Instruction: Apply AandD Ointment as  directed Kyle Spence, Kyle Spence ([id number]) Topical Primary Dressing Hydrofera Blue Ready Transfer Foam, 4x5 (in/in) Discharge Instruction: Apply Hydrofera Blue Ready to wound bed as directed Secondary Dressing Allevyn Adhesive Foam Sacrum Dressing, 6.75x6.75 (in/in) Secured With Compression Wrap Compression Stockings Environmental education officer) Signed: 02/06/2021 5:35:56 PM By: Gretta Cool, BSN, RN, CWS, Kim RN, BSN Entered By: Gretta Cool, BSN, RN, CWS, Kim on 02/05/2021 15:51:10 Kyle Spence ([id number]) -------------------------------------------------------------------------------- [place] Details Patient Name: Kyle Spence Date of Service: 02/05/2021 3:15 PM Medical Record Number: [id number] Patient Account Number: [id number] Date of Birth/Sex: 04-Dec-1932 (88 y.o. M) Treating RN: Cornell Barman Primary Care Kimiya Brunelle: Dion Body Other Clinician: Referring Ciro Tashiro: Dion Body Treating Windsor Zirkelbach/Extender: Skipper Cliche in Treatment: 4 Vital Signs Time Taken: 15:36 Temperature (F): 97.4 Height (in): 70 Pulse (bpm): 81 Weight (lbs): 217 Respiratory Rate (breaths/min): 16 Body Mass Index (BMI): 31.1 Blood Pressure (mmHg): 133/75 Reference Range: 80 - 120 mg / dl Electronic Signature(s) Signed: 02/06/2021 5:35:56 PM By: Gretta Cool, BSN, RN, CWS, Kim RN, BSN Entered By: Gretta Cool, BSN, RN, CWS, Kim on 02/05/2021 15:46:51

## 2021-02-15 ENCOUNTER — Other Ambulatory Visit: Payer: Self-pay

## 2021-02-15 ENCOUNTER — Ambulatory Visit
Admission: RE | Admit: 2021-02-15 | Discharge: 2021-02-15 | Disposition: A | Discharge status: Home / Self Care | Payer: Medicare Other | Source: Ambulatory Visit | Attending: Physician Assistant | Admitting: Physician Assistant

## 2021-02-15 DIAGNOSIS — R29810 Facial weakness: Secondary | ICD-10-CM | POA: Insufficient documentation

## 2021-02-15 DIAGNOSIS — R531 Weakness: Secondary | ICD-10-CM | POA: Diagnosis present

## 2021-02-19 ENCOUNTER — Encounter: Payer: Medicare Other | Admitting: Physician Assistant

## 2021-02-19 ENCOUNTER — Other Ambulatory Visit: Payer: Self-pay

## 2021-02-19 DIAGNOSIS — L89150 Pressure ulcer of sacral region, unstageable: Secondary | ICD-10-CM | POA: Diagnosis not present

## 2021-02-19 NOTE — Progress Notes (Addendum)
KERNEY, NIHART ([id number]) Visit Report for 02/19/2021 Chief Complaint Document Details Patient Name: Kyle Spence, Kyle Spence. Date of Service: 02/19/2021 3:15 PM Medical Record Number: [id number] Patient Account Number: [id number] Date of Birth/Sex: 05-15-1932 (88 y.o. M) Treating RN: Donnamarie Poag Primary Care Provider: Dion Body Other Clinician: Referring Provider: Dion Body Treating Provider/Extender: Skipper Cliche in Treatment: 6 Information Obtained from: Patient Chief Complaint Pressure ulcer sacral region Electronic Signature(s) Signed: 02/19/2021 3:15:50 PM By: Worthy Keeler PA-C Entered By: Worthy Keeler on 02/19/2021 15:15:49 Kyle Spence ([id number]) -------------------------------------------------------------------------------- HPI Details Patient Name: Kyle Spence Date of Service: 02/19/2021 3:15 PM Medical Record Number: [id number] Patient Account Number: [id number] Date of Birth/Sex: Sep 28, 1932 (88 y.o. M) Treating RN: Donnamarie Poag Primary Care Provider: Dion Body Other Clinician: Referring Provider: Dion Body Treating Provider/Extender: Skipper Cliche in Treatment: 6 History of Present Illness HPI Description: 01/08/2021 upon inspection today patient presents for initial inspection here in the clinic concerning a significant wound over the sacral area. Unfortunately this is something that began following a fractured hip that occurred just before Thanksgiving 2022. Subsequently the patient was in the hospital had this. And then [facility] for [facility] and physical therapy. The surgeon wanted this done 5 days a week instead reason he had to be inpatient at a skilled nursing facility for this. With that being said he unfortunately during the time in the skilled nursing facility developed the ulceration in the sacral region which initially was very leathery and black for what I am being told the wound care  physician that comes into the facility there actually took care of this quite well based on what. He scored as a been using Santyl that loosen things up quite a bit. With that being said right now it appears to be very moist and that is one of my biggest concerns to be perfectly honest. I also think that the other issue is keeping pressure off which I am not sure is happening quite as effectively as it should right now. His son is living with him to help take care of him and has also been doing what he can as far as dressing changes or otherwise. The patient does have a history of hypertension, generalized weakness, dementia, and chronic kidney disease stage III. 01/25/2021; this is a patient with a pressure ulcer on his lower sacrum in a butterfly distribution extending into his proximal buttocks. The surface of this I think is improving we have been using Hydrofera Blue. He is being cared for at home heroically by his son. He has an air mattress, Roho cushion etc. 02/05/2021 upon evaluation today patient appears to be doing well with regard to his wound. He has been tolerating the dressing changes without complication. Fortunately I think that the sacral area is doing much better the [facility] seems to be doing a great job. 02/19/2021 upon evaluation today patient appears to be doing well with regard to his wound in the sacral region. Fortunately I do not see any signs of active infection at this time this appears to be much more clean than what it was previous and overall I am extremely pleased with where we stand today. No fevers, chills, nausea, vomiting, or diarrhea. Electronic Signature(s) Signed: 02/19/2021 5:44:51 PM By: Worthy Keeler PA-C Entered By: Worthy Keeler on 02/19/2021 17:44:51 Kyle Spence ([id number]) -------------------------------------------------------------------------------- Physical Exam Details Patient Name: Kyle Spence Date of Service: 02/19/2021 3:15  PM Medical Record Number: [id number]  Patient Account Number: [id number] Date of Birth/Sex: 06/21/1932 (88 y.o. M) Treating RN: Donnamarie Poag Primary Care Provider: Dion Body Other Clinician: Referring Provider: Dion Body Treating Provider/Extender: Skipper Cliche in Treatment: 6 Constitutional Well-nourished and well-hydrated in no acute distress. Respiratory normal breathing without difficulty. Psychiatric this patient is able to make decisions and demonstrates good insight into disease process. Alert and Oriented x 3. pleasant and cooperative. Notes Upon inspection patient's wound bed actually showed signs of good granulation epithelization at this point. Fortunately I do not see any evidence of active infection locally or systemically at this time which is great news and overall I am extremely pleased with where we stand today. Electronic Signature(s) Signed: 02/19/2021 5:45:06 PM By: Worthy Keeler PA-C Entered By: Worthy Keeler on 02/19/2021 17:45:05 Kyle Spence ([id number]) -------------------------------------------------------------------------------- Physician Orders Details Patient Name: Kyle Spence Date of Service: 02/19/2021 3:15 PM Medical Record Number: [id number] Patient Account Number: [id number] Date of Birth/Sex: 1932-04-01 (88 y.o. M) Treating RN: Donnamarie Poag Primary Care Provider: Dion Body Other Clinician: Referring Provider: Dion Body Treating Provider/Extender: Skipper Cliche in Treatment: 6 Verbal / Phone Orders: No Diagnosis Coding ICD-10 Coding Code Description L89.150 Pressure ulcer of sacral region, unstageable M62.81 Muscle weakness (generalized) I10 Essential (primary) hypertension N18.30 Chronic kidney disease, stage 3 unspecified Follow-up Appointments o Return Appointment in 2 weeks. [place] for wound care. May utilize formulary equivalent dressing for wound  treatment orders unless otherwise specified. Home Health Nurse may visit PRN to address patientos wound care needs. Kathaleen Bury phone [phone] o Scheduled days for dressing changes to be completed; exception, patient has scheduled wound care visit that day. o **Please direct any NON-WOUND related issues/requests for orders to patient's Primary Care Physician. **If current dressing causes regression in wound condition, may D/C ordered dressing product/s and apply Normal Saline Moist Dressing daily until next [place] or Other MD appointment. **Notify Wound Healing Center of regression in wound condition at [phone]. Bathing/ Shower/ Hygiene o Clean wound with Normal Saline or wound cleanser. o No tub bath. Anesthetic (Use 'Patient Medications' Section for Anesthetic Order Entry) o Lidocaine applied to wound bed Off-Loading o Roho cushion for wheelchair o Hospital bed/mattress o Low air-loss mattress (Group 2) o Turn and reposition every 2 hours - keep pressure off of sacral wound as much as possible- Additional Orders / Instructions o Follow Nutritious Diet and Increase Protein Intake Wound Treatment Wound #1 - Sacrum Cleanser: Normal Saline 1 x Per Day/30 Days Discharge Instructions: Wash your hands with soap and water. Remove old dressing, discard into plastic bag and place into trash. Cleanse the wound with Normal Saline prior to applying a clean dressing using gauze sponges, not tissues or cotton balls. Do not scrub or use excessive force. Pat dry using gauze sponges, not tissue or cotton balls. Cleanser: Wound Cleanser (Home Health) 1 x Per Day/30 Days Discharge Instructions: Wash your hands with soap and water. Remove old dressing, discard into plastic bag and place into trash. Cleanse the wound with Wound Cleanser prior to applying a clean dressing using gauze sponges, not tissues or cotton balls. Do not scrub or use  excessive force. Pat dry using gauze sponges, not tissue or cotton balls. Peri-Wound Care: AandD Ointment ([place]) 1 x Per Day/30 Days Discharge Instructions: To any excoriated or newly healed areasApply AandD Ointment as directed Primary Dressing: Hydrofera Blue Ready Transfer Foam, 4x5 (in/in) (Home Health) 1 x Per Day/30 Days  Discharge Instructions: Apply Hydrofera Blue Ready to wound bed as directed Secondary Dressing: Allevyn Adhesive Foam Sacrum Dressing, 6.75x6.75 (in/in) ([place]) (Generic) 1 x Per Day/30 Days FANUEL, DORNBUSH ([id number]) Discharge Instructions: or alternative border foam Electronic Signature(s) Signed: 02/19/2021 4:07:35 PM By: Donnamarie Poag Signed: 02/19/2021 6:21:10 PM By: Worthy Keeler PA-C Entered By: Donnamarie Poag on 02/19/2021 15:51:38 Kyle Spence ([id number]) -------------------------------------------------------------------------------- Problem List Details Patient Name: Kyle Spence, Kyle Spence. Date of Service: 02/19/2021 3:15 PM Medical Record Number: [id number] Patient Account Number: [id number] Date of Birth/Sex: [birth year] (88 y.o. M) Treating RN: Donnamarie Poag Primary Care Provider: Dion Body Other Clinician: Referring Provider: Dion Body Treating Provider/Extender: Skipper Cliche in Treatment: 6 Active Problems ICD-10 Encounter Code Description Active Date MDM Diagnosis L89.150 Pressure ulcer of sacral region, unstageable 01/08/2021 No Yes M62.81 Muscle weakness (generalized) 01/08/2021 No Yes I10 Essential (primary) hypertension 01/08/2021 No Yes N18.30 Chronic kidney disease, stage 3 unspecified 01/08/2021 No Yes Inactive Problems Resolved Problems Electronic Signature(s) Signed: 02/19/2021 3:15:41 PM By: Worthy Keeler PA-C Entered By: Worthy Keeler on 02/19/2021 15:15:40 Kyle Spence ([id number]) -------------------------------------------------------------------------------- Progress Note Details Patient  Name: Kyle Spence. Date of Service: 02/19/2021 3:15 PM Medical Record Number: [id number] Patient Account Number: [id number] Date of Birth/Sex: October 07, 1932 (88 y.o. M) Treating RN: Donnamarie Poag Primary Care Provider: Dion Body Other Clinician: Referring Provider: Dion Body Treating Provider/Extender: Skipper Cliche in Treatment: 6 Subjective Chief Complaint Information obtained from Patient Pressure ulcer sacral region History of Present Illness (HPI) 01/08/2021 upon inspection today patient presents for initial inspection here in the clinic concerning a significant wound over the sacral area. Unfortunately this is something that began following a fractured hip that occurred just before Thanksgiving 2022. Subsequently the patient was in the hospital had this. And then [facility] for [facility] physical therapy. The surgeon wanted this done 5 days a week instead reason he had to be inpatient at a skilled nursing facility for this. With that being said he unfortunately during the time in the skilled nursing facility developed the ulceration in the sacral region which initially was very leathery and black for what I am being told the wound care physician that comes into the facility there actually took care of this quite well based on what. He scored as a been using Santyl that loosen things up quite a bit. With that being said right now it appears to be very moist and that is one of my biggest concerns to be perfectly honest. I also think that the other issue is keeping pressure off which I am not sure is happening quite as effectively as it should right now. His son is living with him to help take care of him and has also been doing what he can as far as dressing changes or otherwise. The patient does have a history of hypertension, generalized weakness, dementia, and chronic kidney disease stage III. 01/25/2021; this is a patient with a pressure ulcer  on his lower sacrum in a butterfly distribution extending into his proximal buttocks. The surface of this I think is improving we have been using Hydrofera Blue. He is being cared for at home heroically by his son. He has an air mattress, Roho cushion etc. 02/05/2021 upon evaluation today patient appears to be doing well with regard to his wound. He has been tolerating the dressing changes without complication. Fortunately I think that the sacral area is doing much better the [facility] seems to be  doing a great job. 02/19/2021 upon evaluation today patient appears to be doing well with regard to his wound in the sacral region. Fortunately I do not see any signs of active infection at this time this appears to be much more clean than what it was previous and overall I am extremely pleased with where we stand today. No fevers, chills, nausea, vomiting, or diarrhea. Objective Constitutional Well-nourished and well-hydrated in no acute distress. Vitals Time Taken: 3:40 PM, Height: 70 in, Weight: 217 lbs, BMI: 31.1, Temperature: 99.1 F, Pulse: 67 bpm, Respiratory Rate: 16 breaths/min, Blood Pressure: 126/76 mmHg. Respiratory normal breathing without difficulty. Psychiatric this patient is able to make decisions and demonstrates good insight into disease process. Alert and Oriented x 3. pleasant and cooperative. General Notes: Upon inspection patient's wound bed actually showed signs of good granulation epithelization at this point. Fortunately I do not see any evidence of active infection locally or systemically at this time which is great news and overall I am extremely pleased with where we stand today. Integumentary (Hair, Skin) Wound #1 status is Open. Original cause of wound was Gradually Appeared. The date acquired was: 12/23/2020. The wound has been in treatment 6 weeks. The wound is located on the Sacrum. The wound measures 0.5cm length x 1cm width x 0.1cm depth; 0.393cm^2 area  and 0.039cm^3 volume. There is no tunneling or undermining noted. There is a medium amount of serosanguineous drainage noted. There is large (67-100%) red, pink granulation within the wound bed. There is a small (1-33%) amount of necrotic tissue within the wound bed including [place]. Kyle Spence, Kyle Spence ([id number]) Assessment Active Problems ICD-10 Pressure ulcer of sacral region, unstageable Muscle weakness (generalized) Essential (primary) hypertension Chronic kidney disease, stage 3 unspecified Plan Follow-up Appointments: Return Appointment in 2 weeks. Home Health: [facility] for wound care. May utilize formulary equivalent dressing for wound treatment orders unless otherwise specified. Home Health Nurse may visit PRN to address patient s wound care needs. Oneida Arenas phone [phone] [phone] Scheduled days for dressing changes to be completed; exception, patient has scheduled wound care visit that day. **Please direct any NON-WOUND related issues/requests for orders to patient's Primary Care Physician. **If current dressing causes regression in wound condition, may D/C ordered dressing product/s and apply Normal Saline Moist Dressing daily until next Wound Healing Center or Other MD appointment. **Notify Wound Healing Center of regression in wound condition at [phone]. Bathing/ Shower/ Hygiene: Clean wound with Normal Saline or wound cleanser. No tub bath. Anesthetic (Use 'Patient Medications' Section for Anesthetic Order Entry): Lidocaine applied to wound bed Off-Loading: Roho cushion for wheelchair Hospital bed/mattress Low air-loss mattress (Group 2) Turn and reposition every 2 hours - keep pressure off of sacral wound as much as possible- Additional Orders / Instructions: Follow Nutritious Diet and Increase Protein Intake WOUND #1: - Sacrum Wound Laterality: Cleanser: Normal Saline 1 x Per Day/30 Days Discharge Instructions: Wash your  hands with soap and water. Remove old dressing, discard into plastic bag and place into trash. Cleanse the wound with Normal Saline prior to applying a clean dressing using gauze sponges, not tissues or cotton balls. Do not scrub or use excessive force. Pat dry using gauze sponges, not tissue or cotton balls. Cleanser: Wound Cleanser (Home Health) 1 x Per Day/30 Days Discharge Instructions: Wash your hands with soap and water. Remove old dressing, discard into plastic bag and place into trash. Cleanse the wound with Wound Cleanser prior to applying a clean dressing using gauze sponges,  not tissues or cotton balls. Do not scrub or use excessive force. Pat dry using gauze sponges, not tissue or cotton balls. Peri-Wound Care: AandD Ointment ([place]) 1 x Per Day/30 Days Discharge Instructions: To any excoriated or newly healed areasApply AandD Ointment as directed Primary Dressing: Hydrofera Blue Ready Transfer Foam, 4x5 (in/in) (Home Health) 1 x Per Day/30 Days Discharge Instructions: Apply Hydrofera Blue Ready to wound bed as directed Secondary Dressing: Allevyn Adhesive Foam Sacrum Dressing, 6.75x6.75 (in/in) (Home Health) (Generic) 1 x Per Day/30 Days Discharge Instructions: or alternative border foam 1. Would recommend currently that we going to continue with the wound care measures as before and the patient is in agreement with plan. This includes the use of the [facility] dressing which I think is doing a good job. 2. I am also can recommend that we continue with the border foam dressing to cover. 3. I would also recommend he continue with aggressive and appropriate offloading overall I do believe the patient's son is taking good care of him and the patient is doing well and trying to keep pressure off as well. We will see patient back for reevaluation in 2 weeks here in the clinic. If anything worsens or changes patient will contact our office for  additional recommendations. Electronic Signature(s) Signed: 02/19/2021 5:45:54 PM By: Worthy Keeler PA-C Entered By: Worthy Keeler on 02/19/2021 17:45:54 Devoss, Joyice Faster ([id number]) -------------------------------------------------------------------------------- SuperBill Details Patient Name: Kyle Spence Date of Service: 02/19/2021 Medical Record Number: [id number] Patient Account Number: [id number] Date of Birth/Sex: Dec 13, 1932 (88 y.o. M) Treating RN: Donnamarie Poag Primary Care Provider: Dion Body Other Clinician: Referring Provider: Dion Body Treating Provider/Extender: Skipper Cliche in Treatment: 6 Diagnosis Coding ICD-10 Codes Code Description L89.150 Pressure ulcer of sacral region, unstageable M62.81 Muscle weakness (generalized) I10 Essential (primary) hypertension N18.30 Chronic kidney disease, stage 3 unspecified Facility Procedures CPT4 Code: [id number] Description: 99213 - WOUND CARE VISIT-LEV 3 EST PT Modifier: Quantity: 1 Physician Procedures CPT4 Code: [id number] Description: [id number] - WC PHYS LEVEL 3 - EST PT Modifier: Quantity: 1 CPT4 Code: Description: ICD-10 Diagnosis Description L89.150 Pressure ulcer of sacral region, unstageable M62.81 Muscle weakness (generalized) I10 Essential (primary) hypertension Modifier: Quantity: Electronic Signature(s) Signed: 02/19/2021 5:46:19 PM By: Worthy Keeler PA-C Previous Signature: 02/19/2021 4:07:35 PM Version By: Donnamarie Poag Entered By: Worthy Keeler on 02/19/2021 17:46:19

## 2021-02-19 NOTE — Progress Notes (Addendum)
Kyle Spence, Kyle Spence ([id number]) Visit Report for 02/19/2021 Arrival Information Details Patient Name: Kyle Spence, Kyle Spence. Date of Service: 02/19/2021 3:15 PM Medical Record Number: [id number] Patient Account Number: [id number] Date of Birth/Sex: 1932-10-18 (88 y.o. M) Treating RN: Donnamarie Poag Primary Care Ishitha Roper: Dion Body Other Clinician: Referring Qais Jowers: Dion Body Treating Ardith Lewman/Extender: Skipper Cliche in Treatment: 6 Visit Information History Since Last Visit Added or deleted any medications: No Patient Arrived: Wheel Chair Had a fall or experienced change in No Arrival Time: 15:36 activities of daily living that may affect Accompanied By: son risk of falls: Transfer Assistance: Manual Hospitalized since last visit: No Patient Identification Verified: Yes Has Dressing in Place as Prescribed: Yes Secondary Verification Process Completed: Yes Pain Present Now: No Patient Requires Transmission-Based Precautions: No Patient Has Alerts: No Electronic Signature(s) Signed: 02/19/2021 4:07:35 PM By: Donnamarie Poag Entered By: Donnamarie Poag on 02/19/2021 15:40:47 Kyle Spence ([id number]) -------------------------------------------------------------------------------- Clinic Level of Care Assessment Details Patient Name: Kyle Spence. Date of Service: 02/19/2021 3:15 PM Medical Record Number: [id number] Patient Account Number: [id number] Date of Birth/Sex: 1932-06-13 (88 y.o. M) Treating RN: Donnamarie Poag Primary Care Joanna Borawski: Dion Body Other Clinician: Referring Meridith Romick: Dion Body Treating Shemia Bevel/Extender: Skipper Cliche in Treatment: 6 Clinic Level of Care Assessment Items TOOL 4 Quantity Score '[]'  - Use when only an EandM is performed on FOLLOW-UP visit 0 ASSESSMENTS - Nursing Assessment / Reassessment '[]'  - Reassessment of Co-morbidities (includes updates in patient status) 0 '[]'  - 0 Reassessment of Adherence to Treatment  Plan ASSESSMENTS - Wound and Skin Assessment / Reassessment X - Simple Wound Assessment / Reassessment - one wound 1 5 '[]'  - 0 Complex Wound Assessment / Reassessment - multiple wounds '[]'  - 0 Dermatologic / Skin Assessment (not related to wound area) ASSESSMENTS - Focused Assessment '[]'  - Circumferential Edema Measurements - multi extremities 0 '[]'  - 0 Nutritional Assessment / Counseling / Intervention '[]'  - 0 Lower Extremity Assessment (monofilament, tuning fork, pulses) '[]'  - 0 Peripheral Arterial Disease Assessment (using hand held doppler) ASSESSMENTS - Ostomy and/or Continence Assessment and Care '[]'  - Incontinence Assessment and Management 0 '[]'  - 0 Ostomy Care Assessment and Management (repouching, etc.) PROCESS - Coordination of Care X - Simple Patient / Family Education for ongoing care 1 15 '[]'  - 0 Complex (extensive) Patient / Family Education for ongoing care '[]'  - 0 Staff obtains Programmer, systems, Records, Test Results / Process Orders X- 1 10 Staff telephones HHA, Nursing Homes / Clarify orders / etc '[]'  - 0 Routine Transfer to another Facility (non-emergent condition) '[]'  - 0 Routine Hospital Admission (non-emergent condition) '[]'  - 0 New Admissions / Biomedical engineer / Ordering NPWT, Apligraf, etc. '[]'  - 0 Emergency Hospital Admission (emergent condition) X- 1 10 Simple Discharge Coordination '[]'  - 0 Complex (extensive) Discharge Coordination PROCESS - Special Needs '[]'  - Pediatric / Minor Patient Management 0 '[]'  - 0 Isolation Patient Management '[]'  - 0 Hearing / Language / Visual special needs '[]'  - 0 Assessment of Community assistance (transportation, D/C planning, etc.) '[]'  - 0 Additional assistance / Altered mentation '[]'  - 0 Support Surface(s) Assessment (bed, cushion, seat, etc.) INTERVENTIONS - Wound Cleansing / Measurement Kyle Spence, Kyle L. ([id number]) X- 1 5 Simple Wound Cleansing - one wound '[]'  - 0 Complex Wound Cleansing - multiple wounds X- 1 5 Wound  Imaging (photographs - any number of wounds) '[]'  - 0 Wound Tracing (instead of photographs) X- 1 5 Simple Wound Measurement - one wound '[]'  - 0 Complex Wound Measurement -  multiple wounds INTERVENTIONS - Wound Dressings X - Small Wound Dressing one or multiple wounds 1 10 '[]'  - 0 Medium Wound Dressing one or multiple wounds '[]'  - 0 Large Wound Dressing one or multiple wounds X- 1 5 Application of Medications - topical '[]'  - 0 Application of Medications - injection INTERVENTIONS - Miscellaneous '[]'  - External ear exam 0 '[]'  - 0 Specimen Collection (cultures, biopsies, blood, body fluids, etc.) '[]'  - 0 Specimen(s) / Culture(s) sent or taken to Lab for analysis X- 1 10 Patient Transfer (multiple staff / Harrel Lemon Lift / Similar devices) '[]'  - 0 Simple Staple / Suture removal (25 or less) '[]'  - 0 Complex Staple / Suture removal (26 or more) '[]'  - 0 Hypo / Hyperglycemic Management (close monitor of Blood Glucose) '[]'  - 0 Ankle / Brachial Index (ABI) - do not check if billed separately X- 1 5 Vital Signs Has the patient been seen at the hospital within the last three years: Yes Total Score: 85 Level Of Care: New/Established - Level 3 Electronic Signature(s) Signed: 02/19/2021 4:07:35 PM By: Donnamarie Poag Entered By: Donnamarie Poag on 02/19/2021 15:57:58 Kyle Spence ([id number]) -------------------------------------------------------------------------------- Encounter Discharge Information Details Patient Name: Kyle Spence. Date of Service: 02/19/2021 3:15 PM Medical Record Number: [id number] Patient Account Number: [id number] Date of Birth/Sex: 1932-12-26 (88 y.o. M) Treating RN: Donnamarie Poag Primary Care Eydan Chianese: Dion Body Other Clinician: Referring Landa Mullinax: Dion Body Treating Myran Arcia/Extender: Skipper Cliche in Treatment: 6 Encounter Discharge Information Items Discharge Condition: Stable Ambulatory Status: Wheelchair Discharge Destination:  Home Transportation: Private Auto Accompanied By: son Schedule Follow-up Appointment: Yes Clinical Summary of Care: Electronic Signature(s) Signed: 02/19/2021 4:07:35 PM By: Donnamarie Poag Entered By: Donnamarie Poag on 02/19/2021 16:05:27 Kyle Spence ([id number]) -------------------------------------------------------------------------------- Lower Extremity Assessment Details Patient Name: Kyle Spence. Date of Service: 02/19/2021 3:15 PM Medical Record Number: [id number] Patient Account Number: [id number] Date of Birth/Sex: [birth year] (88 y.o. M) Treating RN: Donnamarie Poag Primary Care Lesleyann Fichter: Dion Body Other Clinician: Referring Lutie Pickler: Dion Body Treating Glorine Hanratty/Extender: Jeri Cos Weeks in Treatment: 6 Electronic Signature(s) Signed: 02/19/2021 4:07:35 PM By: Donnamarie Poag Entered By: Donnamarie Poag on 02/19/2021 15:46:54 Kyle Spence ([id number]) -------------------------------------------------------------------------------- Multi Wound Chart Details Patient Name: Kyle Spence. Date of Service: 02/19/2021 3:15 PM Medical Record Number: [id number] Patient Account Number: [id number] Date of Birth/Sex: [birth year] (88 y.o. M) Treating RN: Donnamarie Poag Primary Care Artist Bloom: Dion Body Other Clinician: Referring Leovanni Bjorkman: Dion Body Treating Viola Kinnick/Extender: Skipper Cliche in Treatment: 6 Vital Signs Height(in): 70 Pulse(bpm): 8 Weight(lbs): 217 Blood Pressure(mmHg): 126/76 Body Mass Index(BMI): 31.1 Temperature(F): 99.1 Respiratory Rate(breaths/min): 16 Photos: [N/A:N/A] Wound Location: Sacrum N/A N/A Wounding Event: Gradually Appeared N/A N/A Primary Etiology: Pressure Ulcer N/A N/A Comorbid History: Hypertension N/A N/A Date Acquired: 12/23/2020 N/A N/A Weeks of Treatment: 6 N/A N/A Wound Status: Open N/A N/A Wound Recurrence: No N/A N/A Measurements L x W x D (cm) 0.5x1x0.1 N/A N/A Area (cm) : 0.393 N/A  N/A Volume (cm) : 0.039 N/A N/A % Reduction in Area: 98.60% N/A N/A % Reduction in Volume: 98.60% N/A N/A Classification: Unstageable/Unclassified N/A N/A Exudate Amount: Medium N/A N/A Exudate Type: Serosanguineous N/A N/A Exudate Color: red, brown N/A N/A Granulation Amount: Large (67-100%) N/A N/A Granulation Quality: Red, Pink N/A N/A Necrotic Amount: Small (1-33%) N/A N/A Treatment Notes Electronic Signature(s) Signed: 02/19/2021 4:07:35 PM By: Donnamarie Poag Entered By: Donnamarie Poag on 02/19/2021 15:47:32 Kyle Spence ([id number]) -------------------------------------------------------------------------------- [place] Details Patient Name: Kyle Spence. Date  of Service: 02/19/2021 3:15 PM Medical Record Number: [id number] Patient Account Number: [id number] Date of Birth/Sex: [birth year] (88 y.o. M) Treating RN: Donnamarie Poag Primary Care Eloise Mula: Dion Body Other Clinician: Referring Aamilah Augenstein: Dion Body Treating Travarius Lange/Extender: Skipper Cliche in Treatment: 6 Active Inactive Abuse / Safety / Falls / Self Care Management Nursing Diagnoses: Potential for injury related to falls Potential for injury related to transfers Self care deficit: actual or potential Goals: Patient/caregiver will verbalize understanding of skin care regimen Date Initiated: 01/08/2021 Date Inactivated: 01/25/2021 Target Resolution Date: 02/08/2021 Goal Status: Met Patient/caregiver will verbalize understanding of the importance to maintain current immunizations/vaccinations Date Initiated: 01/08/2021 Date Inactivated: 01/25/2021 Target Resolution Date: 02/08/2021 Goal Status: Met Patient/caregiver will verbalize/demonstrate measure taken to improve self care Date Initiated: 01/08/2021 Date Inactivated: 02/19/2021 Target Resolution Date: 03/11/2021 Goal Status: Met Patient/caregiver will verbalize/demonstrate measures taken to improve the patient's  personal safety Date Initiated: 01/08/2021 Date Inactivated: 02/19/2021 Target Resolution Date: 04/08/2021 Goal Status: Met Patient/caregiver will verbalize/demonstrate measures taken to prevent injury and/or falls Date Initiated: 01/08/2021 Target Resolution Date: 05/09/2021 Goal Status: Active Patient/caregiver will verbalize/demonstrate understanding of what to do in case of emergency Date Initiated: 01/08/2021 Target Resolution Date: 06/08/2021 Goal Status: Active Interventions: Assess fall risk on admission and as needed Assess: immobility, friction, shearing, incontinence upon admission and as needed Assess impairment of mobility on admission and as needed per policy Assess personal safety and home safety (as indicated) on admission and as needed Assess self care needs on admission and as needed Patient/Caregiver referred to community resources (specify in notes) Notes: Wound/Skin Impairment Nursing Diagnoses: Knowledge deficit related to ulceration/compromised skin integrity Goals: Patient/caregiver will verbalize understanding of skin care regimen Date Initiated: 01/08/2021 Date Inactivated: 02/19/2021 Target Resolution Date: 02/08/2021 Goal Status: Met Ulcer/skin breakdown will have a volume reduction of 30% by week 4 Date Initiated: 01/08/2021 Date Inactivated: 02/19/2021 Target Resolution Date: 03/11/2021 Goal Status: Met Ulcer/skin breakdown will have a volume reduction of 50% by week 8 Date Initiated: 01/08/2021 Target Resolution Date: 04/08/2021 Goal Status: Active Kyle Spence, Kyle Spence ([id number]) Ulcer/skin breakdown will have a volume reduction of 80% by week 12 Date Initiated: 01/08/2021 Target Resolution Date: 05/09/2021 Goal Status: Active Ulcer/skin breakdown will heal within 14 weeks Date Initiated: 01/08/2021 Target Resolution Date: 06/08/2021 Goal Status: Active Interventions: Assess patient/caregiver ability to obtain necessary supplies Assess  patient/caregiver ability to perform ulcer/skin care regimen upon admission and as needed Assess ulceration(s) every visit Notes: Electronic Signature(s) Signed: 02/19/2021 4:07:35 PM By: Donnamarie Poag Entered By: Donnamarie Poag on 02/19/2021 15:47:15 Kyle Spence ([id number]) -------------------------------------------------------------------------------- Pain Assessment Details Patient Name: Kyle Spence. Date of Service: 02/19/2021 3:15 PM Medical Record Number: [id number] Patient Account Number: [id number] Date of Birth/Sex: 1932/12/29 (88 y.o. M) Treating RN: Donnamarie Poag Primary Care Gerhardt Gleed: Dion Body Other Clinician: Referring Sylvestre Rathgeber: Dion Body Treating Laurelin Elson/Extender: Skipper Cliche in Treatment: 6 Active Problems Location of Pain Severity and Description of Pain Patient Has Paino No Site Locations Rate the pain. Current Pain Level: 0 Pain Management and Medication Current Pain Management: Electronic Signature(s) Signed: 02/19/2021 4:07:35 PM By: Donnamarie Poag Entered By: Donnamarie Poag on 02/19/2021 15:42:25 Kyle Spence ([id number]) -------------------------------------------------------------------------------- Patient/Caregiver Education Details Patient Name: Kyle Spence Date of Service: 02/19/2021 3:15 PM Medical Record Number: [id number] Patient Account Number: [id number] Date of Birth/Gender: Aug 16, 1932 (88 y.o. M) Treating RN: Donnamarie Poag Primary Care Physician: Dion Body Other Clinician: Referring Physician: Dion Body Treating Physician/Extender: Skipper Cliche in Treatment: 6 Education Assessment Education Provided  To: Caregiver Education Topics Provided Basic Hygiene: Offloading: Wound/Skin Impairment: Engineer, maintenance) Signed: 02/19/2021 4:07:35 PM By: Donnamarie Poag Entered By: Donnamarie Poag on 02/19/2021 15:58:34 Kyle Spence  ([id number]) -------------------------------------------------------------------------------- Wound Assessment Details Patient Name: Kyle Spence. Date of Service: 02/19/2021 3:15 PM Medical Record Number: [id number] Patient Account Number: [id number] Date of Birth/Sex: May 06, 1932 (88 y.o. M) Treating RN: Donnamarie Poag Primary Care Andrei Mccook: Dion Body Other Clinician: Referring Keili Hasten: Dion Body Treating Darreon Lutes/Extender: Skipper Cliche in Treatment: 6 Wound Status Wound Number: 1 Primary Etiology: Pressure Ulcer Wound Location: Sacrum Wound Status: Open Wounding Event: Gradually Appeared Comorbid History: Hypertension Date Acquired: 12/23/2020 Weeks Of Treatment: 6 Clustered Wound: No Photos Wound Measurements Length: (cm) 0.5 Width: (cm) 1 Depth: (cm) 0.1 Area: (cm) 0.393 Volume: (cm) 0.039 % Reduction in Area: 98.6% % Reduction in Volume: 98.6% Tunneling: No Undermining: No Wound Description Classification: Unstageable/Unclassified Exudate Amount: Medium Exudate Type: Serosanguineous Exudate Color: red, brown Foul Odor After Cleansing: No Slough/Fibrino Yes Wound Bed Granulation Amount: Large (67-100%) Granulation Quality: Red, Pink Necrotic Amount: Small (1-33%) Necrotic Quality: Adherent Slough Treatment Notes Wound #1 (Sacrum) Cleanser Normal Saline Discharge Instruction: Wash your hands with soap and water. Remove old dressing, discard into plastic bag and place into trash. Cleanse the wound with Normal Saline prior to applying a clean dressing using gauze sponges, not tissues or cotton balls. Do not scrub or use excessive force. Pat dry using gauze sponges, not tissue or cotton balls. Wound Cleanser Discharge Instruction: Wash your hands with soap and water. Remove old dressing, discard into plastic bag and place into trash. Cleanse the wound with Wound Cleanser prior to applying a clean dressing using gauze sponges, not tissues or  cotton balls. Do not Kyle Spence, Kyle L. ([id number]) scrub or use excessive force. Pat dry using gauze sponges, not tissue or cotton balls. [place] Discharge Instruction: To any excoriated or newly healed areasApply AandD Ointment as directed Topical Primary Dressing Hydrofera Blue Ready Transfer Foam, 4x5 (in/in) Discharge Instruction: Apply Hydrofera Blue Ready to wound bed as directed Secondary Dressing Allevyn Adhesive Foam Sacrum Dressing, 6.75x6.75 (in/in) Discharge Instruction: or alternative border foam Secured With Compression Wrap Compression Stockings Add-Ons Electronic Signature(s) Signed: 02/19/2021 4:07:35 PM By: Donnamarie Poag Entered By: Donnamarie Poag on 02/19/2021 15:46:39 Kyle Spence, Kyle Spence ([id number]) -------------------------------------------------------------------------------- [place] Details Patient Name: Kyle Spence. Date of Service: 02/19/2021 3:15 PM Medical Record Number: [id number] Patient Account Number: [id number] Date of Birth/Sex: 18-Jun-1932 (88 y.o. M) Treating RN: Donnamarie Poag Primary Care Jeanine Caven: Dion Body Other Clinician: Referring Sherlyn Ebbert: Dion Body Treating Kathern Lobosco/Extender: Skipper Cliche in Treatment: 6 Vital Signs Time Taken: 15:40 Temperature (F): 99.1 Height (in): 70 Pulse (bpm): 67 Weight (lbs): 217 Respiratory Rate (breaths/min): 16 Body Mass Index (BMI): 31.1 Blood Pressure (mmHg): 126/76 Reference Range: 80 - 120 mg / dl Electronic Signature(s) Signed: 02/19/2021 4:07:35 PM By: Donnamarie Poag Entered ByDonnamarie Poag on 02/19/2021 15:42:15

## 2021-03-07 ENCOUNTER — Other Ambulatory Visit: Payer: Self-pay

## 2021-03-07 ENCOUNTER — Encounter: Payer: Medicare Other | Attending: Physician Assistant | Admitting: Physician Assistant

## 2021-03-07 DIAGNOSIS — M6281 Muscle weakness (generalized): Secondary | ICD-10-CM | POA: Insufficient documentation

## 2021-03-07 DIAGNOSIS — N183 Chronic kidney disease, stage 3 unspecified: Secondary | ICD-10-CM | POA: Diagnosis not present

## 2021-03-07 DIAGNOSIS — Z09 Encounter for follow-up examination after completed treatment for conditions other than malignant neoplasm: Secondary | ICD-10-CM | POA: Diagnosis present

## 2021-03-07 DIAGNOSIS — F0390 Unspecified dementia without behavioral disturbance: Secondary | ICD-10-CM | POA: Diagnosis not present

## 2021-03-07 DIAGNOSIS — I129 Hypertensive chronic kidney disease with stage 1 through stage 4 chronic kidney disease, or unspecified chronic kidney disease: Secondary | ICD-10-CM | POA: Diagnosis not present

## 2021-03-07 DIAGNOSIS — Z872 Personal history of diseases of the skin and subcutaneous tissue: Secondary | ICD-10-CM | POA: Diagnosis not present

## 2021-03-07 NOTE — Progress Notes (Addendum)
DAUGHTRY, DOBEK ([id number]) Visit Report for 03/07/2021 Chief Complaint Document Details Patient Name: Kyle Spence, Kyle Spence. Date of Service: 03/07/2021 3:30 PM Medical Record Number: [id number] Patient Account Number: [id number] Date of Birth/Sex: 06/11/1932 (88 y.o. M) Treating RN: Donnamarie Poag Primary Care Provider: Dion Body Other Clinician: Referring Provider: Dion Body Treating Provider/Extender: Skipper Cliche in Treatment: 8 Information Obtained from: Patient Chief Complaint Pressure ulcer sacral region Electronic Signature(s) Signed: 03/07/2021 3:16:34 PM By: Worthy Keeler PA-C Entered By: Worthy Keeler on 03/07/2021 15:16:34 Kyle Spence ([id number]) -------------------------------------------------------------------------------- HPI Details Patient Name: Kyle Spence Date of Service: 03/07/2021 3:30 PM Medical Record Number: [id number] Patient Account Number: [id number] Date of Birth/Sex: [birth year] (88 y.o. M) Treating RN: Donnamarie Poag Primary Care Provider: Dion Body Other Clinician: Referring Provider: Dion Body Treating Provider/Extender: Skipper Cliche in Treatment: 8 History of Present Illness HPI Description: 01/08/2021 upon inspection today patient presents for initial inspection here in the clinic concerning a significant wound over the sacral area. Unfortunately this is something that began following a fractured hip that occurred just before Thanksgiving 2022. Subsequently the patient was in the hospital had this. And then went to [facility] for rehabilitation and physical therapy. The surgeon wanted this done 5 days a week instead reason he had to be inpatient at a skilled nursing facility for this. With that being said he unfortunately during the time in the skilled nursing facility developed the ulceration in the sacral region which initially was very leathery and black for what I am being told the wound care  physician that comes into the facility there actually took care of this quite well based on what. He scored as a been using Santyl that loosen things up quite a bit. With that being said right now it appears to be very moist and that is one of my biggest concerns to be perfectly honest. I also think that the other issue is keeping pressure off which I am not sure is happening quite as effectively as it should right now. His son is living with him to help take care of him and has also been doing what he can as far as dressing changes or otherwise. The patient does have a history of hypertension, generalized weakness, dementia, and chronic kidney disease stage III. 01/25/2021; this is a patient with a pressure ulcer on his lower sacrum in a butterfly distribution extending into his proximal buttocks. The surface of this I think is improving we have been using Hydrofera Blue. He is being cared for at home heroically by his son. He has an air mattress, Roho cushion etc. 02/05/2021 upon evaluation today patient appears to be doing well with regard to his wound. He has been tolerating the dressing changes without complication. Fortunately I think that the sacral area is doing much better the [facility] seems to be doing a great job. 02/19/2021 upon evaluation today patient appears to be doing well with regard to his wound in the sacral region. Fortunately I do not see any signs of active infection at this time this appears to be much more clean than what it was previous and overall I am extremely pleased with where we stand today. No fevers, chills, nausea, vomiting, or diarrhea. 03/07/2021 upon evaluation today patient appears to be doing well with regard to his wound in fact he appears to be completely healed which is great news. Fortunately I do not see any signs of active infection at this time. Electronic Signature(s) Signed:  03/07/2021 4:48:16 PM By: Worthy Keeler PA-C Entered By: Worthy Keeler on  03/07/2021 16:48:16 Kyle Spence ([id number]) -------------------------------------------------------------------------------- Physical Exam Details Patient Name: Kyle Spence, Kyle Spence. Date of Service: 03/07/2021 3:30 PM Medical Record Number: [id number] Patient Account Number: [id number] Date of Birth/Sex: 1932-06-07 (88 y.o. M) Treating RN: Donnamarie Poag Primary Care Provider: Dion Body Other Clinician: Referring Provider: Dion Body Treating Provider/Extender: Skipper Cliche in Treatment: 8 Constitutional Well-nourished and well-hydrated in no acute distress. Respiratory normal breathing without difficulty. Psychiatric this patient is able to make decisions and demonstrates good insight into disease process. Alert and Oriented x 3. pleasant and cooperative. Notes Patient's wound bed actually showed signs of complete epithelization which is great news and overall I think he is doing quite well. Electronic Signature(s) Signed: 03/07/2021 4:48:48 PM By: Worthy Keeler PA-C Entered By: Worthy Keeler on 03/07/2021 16:48:48 Kyle Spence ([id number]) -------------------------------------------------------------------------------- Physician Orders Details Patient Name: Kyle Spence Date of Service: 03/07/2021 3:30 PM Medical Record Number: [id number] Patient Account Number: [id number] Date of Birth/Sex: October 14, 1932 (88 y.o. M) Treating RN: Donnamarie Poag Primary Care Provider: Dion Body Other Clinician: Referring Provider: Dion Body Treating Provider/Extender: Skipper Cliche in Treatment: 8 Verbal / Phone Orders: No Diagnosis Coding ICD-10 Coding Code Description L89.150 Pressure ulcer of sacral region, unstageable M62.81 Muscle weakness (generalized) I10 Essential (primary) hypertension N18.30 Chronic kidney disease, stage 3 unspecified Discharge From [facility] Services o Discharge from [place] o  DISCONTINUE Home Health for Wound Care. Non-Wound Condition o Additional non-wound orders/instructions: - Recommend AandD ointment for newly healed skin and any excoriated skin Off-Loading o Turn and reposition every 2 hours - avoid pressure to newly healed sacral/gluteal areas Electronic Signature(s) Signed: 03/07/2021 4:47:36 PM By: Donnamarie Poag Signed: 03/07/2021 5:14:54 PM By: Worthy Keeler PA-C Entered By: Donnamarie Poag on 03/07/2021 16:16:49 Kyle Spence ([id number]) -------------------------------------------------------------------------------- Problem List Details Patient Name: Kyle Spence. Date of Service: 03/07/2021 3:30 PM Medical Record Number: [id number] Patient Account Number: [id number] Date of Birth/Sex: [birth year] (88 y.o. M) Treating RN: Donnamarie Poag Primary Care Provider: Dion Body Other Clinician: Referring Provider: Dion Body Treating Provider/Extender: Skipper Cliche in Treatment: 8 Active Problems ICD-10 Encounter Code Description Active Date MDM Diagnosis L89.150 Pressure ulcer of sacral region, unstageable 01/08/2021 No Yes M62.81 Muscle weakness (generalized) 01/08/2021 No Yes I10 Essential (primary) hypertension 01/08/2021 No Yes N18.30 Chronic kidney disease, stage 3 unspecified 01/08/2021 No Yes Inactive Problems Resolved Problems Electronic Signature(s) Signed: 03/07/2021 3:16:19 PM By: Worthy Keeler PA-C Entered By: Worthy Keeler on 03/07/2021 15:16:19 Kyle Spence ([id number]) -------------------------------------------------------------------------------- Progress Note Details Patient Name: Kyle Spence. Date of Service: 03/07/2021 3:30 PM Medical Record Number: [id number] Patient Account Number: [id number] Date of Birth/Sex: November 17, 1932 (88 y.o. M) Treating RN: Donnamarie Poag Primary Care Provider: Dion Body Other Clinician: Referring Provider: Dion Body Treating Provider/Extender: Skipper Cliche in Treatment: 8 Subjective Chief Complaint Information obtained from Patient Pressure ulcer sacral region History of Present Illness (HPI) 01/08/2021 upon inspection today patient presents for initial inspection here in the clinic concerning a significant wound over the sacral area. Unfortunately this is something that began following a fractured hip that occurred just before Thanksgiving 2022. Subsequently the patient was in the hospital had this. And then went to [facility] for [facility] and physical therapy. The surgeon wanted this done 5 days a week instead reason he had to be inpatient at a skilled nursing facility for  this. With that being said he unfortunately during the time in the skilled nursing facility developed the ulceration in the sacral region which initially was very leathery and black for what I am being told the wound care physician that comes into the facility there actually took care of this quite well based on what. He scored as a been using Santyl that loosen things up quite a bit. With that being said right now it appears to be very moist and that is one of my biggest concerns to be perfectly honest. I also think that the other issue is keeping pressure off which I am not sure is happening quite as effectively as it should right now. His son is living with him to help take care of him and has also been doing what he can as far as dressing changes or otherwise. The patient does have a history of hypertension, generalized weakness, dementia, and chronic kidney disease stage III. 01/25/2021; this is a patient with a pressure ulcer on his lower sacrum in a butterfly distribution extending into his proximal buttocks. The surface of this I think is improving we have been using Hydrofera Blue. He is being cared for at home heroically by his son. He has an air mattress, Roho cushion etc. 02/05/2021 upon evaluation today patient appears to be doing well with  regard to his wound. He has been tolerating the dressing changes without complication. Fortunately I think that the sacral area is doing much better the [facility] seems to be doing a great job. 02/19/2021 upon evaluation today patient appears to be doing well with regard to his wound in the sacral region. Fortunately I do not see any signs of active infection at this time this appears to be much more clean than what it was previous and overall I am extremely pleased with where we stand today. No fevers, chills, nausea, vomiting, or diarrhea. 03/07/2021 upon evaluation today patient appears to be doing well with regard to his wound in fact he appears to be completely healed which is great news. Fortunately I do not see any signs of active infection at this time. Objective Constitutional Well-nourished and well-hydrated in no acute distress. Vitals Time Taken: 4:03 PM, Height: 70 in, Weight: 217 lbs, BMI: 31.1, Temperature: 98.569 F, Pulse: 69 bpm, Respiratory Rate: 16 breaths/min, Blood Pressure: 127/79 mmHg. Respiratory normal breathing without difficulty. Psychiatric this patient is able to make decisions and demonstrates good insight into disease process. Alert and Oriented x 3. pleasant and cooperative. General Notes: Patient's wound bed actually showed signs of complete epithelization which is great news and overall I think he is doing quite well. Integumentary (Hair, Skin) Wound #1 status is Healed - Epithelialized. Original cause of wound was Gradually Appeared. The date acquired was: 12/23/2020. The wound has been in treatment 8 weeks. The wound is located on the Sacrum. The wound measures 0cm length x 0cm width x 0cm depth; 0cm^2 area and 0cm^3 volume. There is no tunneling or undermining noted. There is a none present amount of drainage noted. There is no granulation within the wound bed. There is no necrotic tissue within the wound bed. Kyle Spence, Kyle Spence  ([id number]) Assessment Active Problems ICD-10 Pressure ulcer of sacral region, unstageable Muscle weakness (generalized) Essential (primary) hypertension Chronic kidney disease, stage 3 unspecified Plan Discharge From [facility] Services: Discharge from [place]: DISCONTINUE Home Health for Wound Care. Non-Wound Condition: Additional non-wound orders/instructions: - Recommend AandD ointment for newly healed skin  and any excoriated skin Off-Loading: Turn and reposition every 2 hours - avoid pressure to newly healed sacral/gluteal areas 1. Would recommend that we going continue with the wound care measures as before and the patient is in agreement with the plan. This includes the use of the Desitin for now although I recommended switching to AandD ointment I think will be less messy for him in the long run. 2. I am also can recommend that we have the patient continue with appropriate offloading I think keeping pressure off the area and doing this regularly will be beneficial for him as well. 3. I would also suggest that he continue to keep a close eye on anything reopening his son we will keep an eye on this for him. We will see patient back for reevaluation in 1 week here in the clinic. If anything worsens or changes patient will contact our office for additional recommendations. Electronic Signature(s) Signed: 03/07/2021 4:49:29 PM By: Worthy Keeler PA-C Entered By: Worthy Keeler on 03/07/2021 16:49:29 Kyle Spence ([id number]) -------------------------------------------------------------------------------- SuperBill Details Patient Name: Kyle Spence Date of Service: 03/07/2021 Medical Record Number: [id number] Patient Account Number: [id number] Date of Birth/Sex: 14-Feb-1932 (88 y.o. M) Treating RN: Donnamarie Poag Primary Care Provider: Dion Body Other Clinician: Referring Provider: Dion Body Treating Provider/Extender:  Skipper Cliche in Treatment: 8 Diagnosis Coding ICD-10 Codes Code Description L89.150 Pressure ulcer of sacral region, unstageable M62.81 Muscle weakness (generalized) I10 Essential (primary) hypertension N18.30 Chronic kidney disease, stage 3 unspecified Facility Procedures CPT4 Code: [id number] Description: [phone] - WOUND CARE VISIT-LEV 2 EST PT Modifier: Quantity: 1 Physician Procedures CPT4 Code: [id number] Description: [id number] - WC PHYS LEVEL 3 - EST PT Modifier: Quantity: 1 CPT4 Code: Description: ICD-10 Diagnosis Description L89.150 Pressure ulcer of sacral region, unstageable M62.81 Muscle weakness (generalized) I10 Essential (primary) hypertension N18.30 Chronic kidney disease, stage 3 unspecified Modifier: Quantity: Electronic Signature(s) Signed: 03/07/2021 4:51:20 PM By: Worthy Keeler PA-C Previous Signature: 03/07/2021 4:47:36 PM Version By: Donnamarie Poag Entered By: Worthy Keeler on 03/07/2021 16:51:20

## 2021-03-07 NOTE — Progress Notes (Addendum)
Kyle Spence, Kyle Spence ([id number]) Visit Report for 03/07/2021 Arrival Information Details Patient Name: Kyle Spence, Kyle Spence. Date of Service: 03/07/2021 3:30 PM Medical Record Number: [id number] Patient Account Number: [id number] Date of Birth/Sex: [birth year] (88 y.o. M) Treating RN: Hansel Feinstein Primary Care Hermilo Dutter: Marisue Ivan Other Clinician: Referring Meryl Ponder: Marisue Ivan Treating Jheri Mitter/Extender: Rowan Blase in Treatment: 8 Visit Information History Since Last Visit Added or deleted any medications: No Patient Arrived: Wheel Chair Had a fall or experienced change in No Arrival Time: 16:02 activities of daily living that may affect Accompanied By: son risk of falls: Transfer Assistance: Manual Hospitalized since last visit: No Patient Identification Verified: Yes Has Dressing in Place as Prescribed: Yes Secondary Verification Process Completed: Yes Pain Present Now: No Patient Requires Transmission-Based Precautions: No Patient Has Alerts: No Electronic Signature(s) Signed: 03/07/2021 4:47:36 PM By: Hansel Feinstein Entered By: Hansel Feinstein on 03/07/2021 16:03:10 Kyle Spence ([id number]) -------------------------------------------------------------------------------- Clinic Level of Care Assessment Details Patient Name: Kyle Spence. Date of Service: 03/07/2021 3:30 PM Medical Record Number: [id number] Patient Account Number: [id number] Date of Birth/Sex: 1932-07-12 (88 y.o. M) Treating RN: Hansel Feinstein Primary Care Devorah Givhan: Marisue Ivan Other Clinician: Referring Khaleelah Yowell: Marisue Ivan Treating Cassi Jenne/Extender: Rowan Blase in Treatment: 8 Clinic Level of Care Assessment Items TOOL 4 Quantity Score []  - Use when only an EandM is performed on FOLLOW-UP visit 0 ASSESSMENTS - Nursing Assessment / Reassessment []  - Reassessment of Co-morbidities (includes updates in patient status) 0 []  - 0 Reassessment of Adherence to Treatment  Plan ASSESSMENTS - Wound and Skin Assessment / Reassessment X - Simple Wound Assessment / Reassessment - one wound 1 5 []  - 0 Complex Wound Assessment / Reassessment - multiple wounds []  - 0 Dermatologic / Skin Assessment (not related to wound area) ASSESSMENTS - Focused Assessment []  - Circumferential Edema Measurements - multi extremities 0 []  - 0 Nutritional Assessment / Counseling / Intervention []  - 0 Lower Extremity Assessment (monofilament, tuning fork, pulses) []  - 0 Peripheral Arterial Disease Assessment (using hand held doppler) ASSESSMENTS - Ostomy and/or Continence Assessment and Care []  - Incontinence Assessment and Management 0 []  - 0 Ostomy Care Assessment and Management (repouching, etc.) PROCESS - Coordination of Care X - Simple Patient / Family Education for ongoing care 1 15 []  - 0 Complex (extensive) Patient / Family Education for ongoing care []  - 0 Staff obtains , Records, Test Results / Process Orders []  - 0 Staff telephones HHA, Nursing Homes / Clarify orders / etc []  - 0 Routine Transfer to another Facility (non-emergent condition) []  - 0 Routine Hospital Admission (non-emergent condition) []  - 0 New Admissions / / Ordering NPWT, Apligraf, etc. []  - 0 Emergency Hospital Admission (emergent condition) X- 1 10 Simple Discharge Coordination []  - 0 Complex (extensive) Discharge Coordination PROCESS - Special Needs []  - Pediatric / Minor Patient Management 0 []  - 0 Isolation Patient Management []  - 0 Hearing / Language / Visual special needs []  - 0 Assessment of Community assistance (transportation, D/C planning, etc.) []  - 0 Additional assistance / Altered mentation []  - 0 Support Surface(s) Assessment (bed, cushion, seat, etc.) INTERVENTIONS - Wound Cleansing / Measurement Perrow, Sara L. ( ) X- 1 5 Simple Wound Cleansing - one wound []  - 0 Complex Wound Cleansing - multiple wounds X- 1 5 Wound  Imaging (photographs - any number of wounds) []  - 0 Wound Tracing (instead of photographs) X- 1 5 Simple Wound Measurement - one wound []  - 0 Complex Wound Measurement -  multiple wounds INTERVENTIONS - Wound Dressings []  - Small Wound Dressing one or multiple wounds 0 []  - 0 Medium Wound Dressing one or multiple wounds []  - 0 Large Wound Dressing one or multiple wounds []  - 0 Application of Medications - topical []  - 0 Application of Medications - injection INTERVENTIONS - Miscellaneous []  - External ear exam 0 []  - 0 Specimen Collection (cultures, biopsies, blood, body fluids, etc.) []  - 0 Specimen(s) / Culture(s) sent or taken to Lab for analysis []  - 0 Patient Transfer (multiple staff / Nurse, adult / Similar devices) []  - 0 Simple Staple / Suture removal (25 or less) []  - 0 Complex Staple / Suture removal (26 or more) []  - 0 Hypo / Hyperglycemic Management (close monitor of Blood Glucose) []  - 0 Ankle / Brachial Index (ABI) - do not check if billed separately X- 1 5 Vital Signs Has the patient been seen at the hospital within the last three years: Yes Total Score: 50 Level Of Care: New/Established - Level 2 Electronic Signature(s) Signed: 03/07/2021 4:47:36 PM By: Hansel Feinstein Entered By: Hansel Feinstein on 03/07/2021 16:22:28 Kyle Spence ([id number]) -------------------------------------------------------------------------------- Encounter Discharge Information Details Patient Name: Kyle Spence. Date of Service: 03/07/2021 3:30 PM Medical Record Number: [id number] Patient Account Number: [id number] Date of Birth/Sex: Mar 02, 1932 (88 y.o. M) Treating RN: Hansel Feinstein Primary Care Chanice Brenton: Marisue Ivan Other Clinician: Referring Rondy Krupinski: Marisue Ivan Treating Sulma Ruffino/Extender: Rowan Blase in Treatment: 8 Encounter Discharge Information Items Discharge Condition: Stable Ambulatory Status: Walker Discharge Destination: Home Transportation:  Private Auto Accompanied By: son Schedule Follow-up Appointment: No Clinical Summary of Care: Electronic Signature(s) Signed: 03/07/2021 4:47:36 PM By: Hansel Feinstein Entered By: Hansel Feinstein on 03/07/2021 16:24:15 Kyle Spence ([id number]) -------------------------------------------------------------------------------- Lower Extremity Assessment Details Patient Name: Kyle Spence. Date of Service: 03/07/2021 3:30 PM Medical Record Number: [id number] Patient Account Number: [id number] Date of Birth/Sex: [birth year] (88 y.o. M) Treating RN: Hansel Feinstein Primary Care Ulys Favia: Marisue Ivan Other Clinician: Referring Stpehanie Montroy: Marisue Ivan Treating Klynn Linnemann/Extender: Allen Derry Weeks in Treatment: 8 Electronic Signature(s) Signed: 03/07/2021 4:47:36 PM By: Hansel Feinstein Entered By: Hansel Feinstein on 03/07/2021 16:09:13 Kyle Spence ([id number]) -------------------------------------------------------------------------------- Multi Wound Chart Details Patient Name: Kyle Spence. Date of Service: 03/07/2021 3:30 PM Medical Record Number: [id number] Patient Account Number: [id number] Date of Birth/Sex: 04/07/1932 (88 y.o. M) Treating RN: Hansel Feinstein Primary Care Cate Oravec: Marisue Ivan Other Clinician: Referring Tippi Mccrae: Marisue Ivan Treating Ayliana Casciano/Extender: Rowan Blase in Treatment: 8 Vital Signs Height(in): 70 Pulse(bpm): 69 Weight(lbs): 217 Blood Pressure(mmHg): 127/79 Body Mass Index(BMI): 31.1 Temperature(F): 98.569 Respiratory Rate(breaths/min): 16 Photos: [N/A:N/A] Wound Location: Sacrum N/A N/A Wounding Event: Gradually Appeared N/A N/A Primary Etiology: Pressure Ulcer N/A N/A Comorbid History: Hypertension N/A N/A Date Acquired: 12/23/2020 N/A N/A Weeks of Treatment: 8 N/A N/A Wound Status: Healed - Epithelialized N/A N/A Wound Recurrence: No N/A N/A Measurements L x W x D (cm) 0x0x0 N/A N/A Area (cm) : 0 N/A N/A Volume (cm) : 0  N/A N/A % Reduction in Area: 100.00% N/A N/A % Reduction in Volume: 100.00% N/A N/A Classification: Unstageable/Unclassified N/A N/A Exudate Amount: None Present N/A N/A Granulation Amount: None Present (0%) N/A N/A Necrotic Amount: None Present (0%) N/A N/A Treatment Notes Electronic Signature(s) Signed: 03/07/2021 4:47:36 PM By: Hansel Feinstein Entered By: Hansel Feinstein on 03/07/2021 16:10:00 Kyle Spence ([id number]) -------------------------------------------------------------------------------- Multi-Disciplinary Care Plan Details Patient Name: Kyle Spence. Date of Service: 03/07/2021 3:30 PM Medical Record Number: [id number] Patient Account Number: [id number]  Date of Birth/Sex: [birth year] (88 y.o. M) Treating RN: Hansel Feinstein Primary Care Tacey Dimaggio: Marisue Ivan Other Clinician: Referring Nyelah Emmerich: Marisue Ivan Treating Renato Spellman/Extender: Rowan Blase in Treatment: 8 Active Inactive Electronic Signature(s) Signed: 03/07/2021 4:47:36 PM By: Hansel Feinstein Entered By: Hansel Feinstein on 03/07/2021 16:09:49 Kyle Spence ([id number]) -------------------------------------------------------------------------------- Pain Assessment Details Patient Name: Kyle Spence. Date of Service: 03/07/2021 3:30 PM Medical Record Number: [id number] Patient Account Number: [id number] Date of Birth/Sex: 06/05/1932 (88 y.o. M) Treating RN: Hansel Feinstein Primary Care Shady Padron: Marisue Ivan Other Clinician: Referring Noele Icenhour: Marisue Ivan Treating Wrenley Sayed/Extender: Rowan Blase in Treatment: 8 Active Problems Location of Pain Severity and Description of Pain Patient Has Paino No Site Locations Rate the pain. Current Pain Level: 0 Pain Management and Medication Current Pain Management: Electronic Signature(s) Signed: 03/07/2021 4:47:36 PM By: Hansel Feinstein Entered By: Hansel Feinstein on 03/07/2021 16:08:13 Kyle Spence  ([id number]) -------------------------------------------------------------------------------- Patient/Caregiver Education Details Patient Name: Kyle Spence Date of Service: 03/07/2021 3:30 PM Medical Record Number: [id number] Patient Account Number: [id number] Date of Birth/Gender: 23-Oct-1932 (88 y.o. M) Treating RN: Hansel Feinstein Primary Care Physician: Marisue Ivan Other Clinician: Referring Physician: Marisue Ivan Treating Physician/Extender: Rowan Blase in Treatment: 8 Education Assessment Education Provided To: Caregiver Education Topics Provided Basic Hygiene: Electronic Signature(s) Signed: 03/07/2021 4:47:36 PM By: Hansel Feinstein Entered By: Hansel Feinstein on 03/07/2021 16:23:41 Kyle Spence ([id number]) -------------------------------------------------------------------------------- Wound Assessment Details Patient Name: Kyle Spence. Date of Service: 03/07/2021 3:30 PM Medical Record Number: [id number] Patient Account Number: [id number] Date of Birth/Sex: 1932-05-03 (88 y.o. M) Treating RN: Hansel Feinstein Primary Care Shacarra Choe: Marisue Ivan Other Clinician: Referring Brinson Tozzi: Marisue Ivan Treating Lavender Stanke/Extender: Rowan Blase in Treatment: 8 Wound Status Wound Number: 1 Primary Etiology: Pressure Ulcer Wound Location: Sacrum Wound Status: Healed - Epithelialized Wounding Event: Gradually Appeared Comorbid History: Hypertension Date Acquired: 12/23/2020 Weeks Of Treatment: 8 Clustered Wound: No Photos Wound Measurements Length: (cm) 0 Width: (cm) 0 Depth: (cm) 0 Area: (cm) 0 Volume: (cm) 0 % Reduction in Area: 100% % Reduction in Volume: 100% Tunneling: No Undermining: No Wound Description Classification: Unstageable/Unclassified Exudate Amount: None Present Foul Odor After Cleansing: No Slough/Fibrino No Wound Bed Granulation Amount: None Present (0%) Necrotic Amount: None Present (0%) Electronic  Signature(s) Signed: 03/07/2021 4:47:36 PM By: Hansel Feinstein Entered By: Hansel Feinstein on 03/07/2021 16:08:54 Kyle Spence ([id number]) -------------------------------------------------------------------------------- Vitals Details Patient Name: Kyle Spence. Date of Service: 03/07/2021 3:30 PM Medical Record Number: [id number] Patient Account Number: [id number] Date of Birth/Sex: 07-Feb-1932 (88 y.o. M) Treating RN: Hansel Feinstein Primary Care Breckin Savannah: Marisue Ivan Other Clinician: Referring Traniece Boffa: Marisue Ivan Treating Addisyn Leclaire/Extender: Rowan Blase in Treatment: 8 Vital Signs Time Taken: 16:03 Temperature (F): 98.569 Height (in): 70 Pulse (bpm): 69 Weight (lbs): 217 Respiratory Rate (breaths/min): 16 Body Mass Index (BMI): 31.1 Blood Pressure (mmHg): 127/79 Reference Range: 80 - 120 mg / dl Electronic Signature(s) Signed: 03/07/2021 4:47:36 PM By: Hansel Feinstein Entered ByHansel Feinstein on 03/07/2021 16:08:05

## 2021-03-21 ENCOUNTER — Ambulatory Visit: Payer: Medicare Other | Admitting: Physician Assistant

## 2022-03-26 ENCOUNTER — Telehealth: Payer: Self-pay

## 2022-03-26 NOTE — Telephone Encounter (Signed)
309 pm.  Phone call made to son [name] to follow up on Palliative Care referral.  Son agreeable to visit Friday at 9 am.

## 2022-03-28 ENCOUNTER — Other Ambulatory Visit: Payer: Medicare Other

## 2022-03-28 VITALS — BP 140/62 | HR 65 | Temp 97.7°F

## 2022-03-28 DIAGNOSIS — Z515 Encounter for palliative care: Secondary | ICD-10-CM

## 2022-03-28 NOTE — Progress Notes (Signed)
PATIENT NAME: Kyle Spence DOB: May 29, 1932 MRN: [id number]  PRIMARY CARE PROVIDER: Dion Body, MD  RESPONSIBLE PARTY:  Acct ID - Guarantor Home Phone Work Phone Relationship Acct Type  [id number] EIVIN, [phone]  Self P/F     [address], [place], [place] [address]   Home visit completed with patient and son [name].  ACP:  Discussed with patient and son.  Living will in place and both sons hold HCPOA.  Reviewed code status and patient remains a full code at this time.  Goal is to remain in the home for as long as possible.  May need LTC placement in the future if patient becomes non-ambulatory.  CKD: Son currently managing patient's diet.  Limiting sodium intake.  Patient will have an occasional soda. Patient has decreased the amount of fast food he is eating.  CKD followed by PCP at this time.  Son reports 2 visits a year to reassess kidney function.  Last eGFR 41 11/08/21.  Dementia:  Some short term memory deficits noted.  Patient starts out in the wheelchair but is able to progress to the walker later in the morning.  He is exercising in the home and walking as much as possible.  Currently incontinent of urine.  Son advises he is seeing more incontinence with bowels.  Diapers are in place.   Son is doing meal prep for breakfast and dinner.  Patient is still able to feed himself.  Able to dress and do grooming care with cues.   Hospice vs Palliative Care:  Discussed differences between services. Reviewed Palliative Care criteria with son.  Consent signed.   Resources:  Patient is scheduled to have [employer] start on Sunday.  Goal is have them in place on Sundays and possibly 1-2 days during the week to offer respite for patient. Meals on Wheels is already in place.  Patient advised he served in the [employer].  Uncertain if he would qualify for VA benefits.  Son would like to know if there are any other resources available to patient.  I will have PC SW contact son  next week to further discuss.   CODE STATUS: Full ADVANCED DIRECTIVES: Yes-2 sons hold HCPOA  MOST FORM: No PPS: 50%   PHYSICAL EXAM:   VITALS: Today's Vitals   03/28/22 0904  BP: (!) 140/62  Pulse: 65  Temp: 97.7 F (36.5 C)  SpO2: 98%    LUNGS: clear to auscultation  CARDIAC: Cor RRR}  EXTREMITIES: - for edema SKIN: Skin color, texture, turgor normal. No rashes or lesions or mobility and turgor normal  NEURO: positive for gait problems and memory problems       Lorenza Burton, RN

## 2022-04-04 ENCOUNTER — Other Ambulatory Visit: Payer: Medicare Other

## 2022-04-04 DIAGNOSIS — Z515 Encounter for palliative care: Secondary | ICD-10-CM

## 2022-04-04 NOTE — Progress Notes (Signed)
TELEPHONE ENCOUNTER  Palliative care SW outreached patients son, [name], as [facility] RN request to discuss additional in home support. PC SW and patient son discussed additional in home support for patient. Currently son has employed [employer] for in home support once a month or so due to financial strain. SW provided son with Mickel Crow contact as a back up.  Discussed long term planning and appropriateness of placement to include SNF vs ALF placement as well as finances in regard to these settings. Currently patient does not qualify for Medicaid.

## 2022-04-16 ENCOUNTER — Other Ambulatory Visit: Payer: Medicare Other

## 2022-04-16 VITALS — BP 112/58 | HR 65 | Temp 97.5°F

## 2022-04-16 DIAGNOSIS — Z515 Encounter for palliative care: Secondary | ICD-10-CM

## 2022-04-16 NOTE — Progress Notes (Signed)
PATIENT NAME: Kyle Spence DOB: 1932-02-04 MRN: [id number]  PRIMARY CARE PROVIDER: Dion Body, MD  RESPONSIBLE PARTY:  Acct ID - Guarantor Home Phone Work Phone Relationship Acct Type  [id number] KHIAN, [phone]  Self P/F     [address], [place], [place] [address]    Home visit completed with patient and son.   Dementia:  Ongoing short term memory deficits.  Continues to talk about the past, growing up and politics.  Using a rolling walker for safe ambulation.  No recent falls.   Continues to be independent with eating.  Son is doing meal prep and he has meals on wheels.   Able to dress himself with cues.  Son notes occasional issues with memory but mostly lack of motivation to do anything.  Son has tried to encourage exercise and more independent but patient has been somewhat resistance.  Incontinence:  Ongoing urinary incontinence.  Increasing bowel incontinence.   Son is trying to keep patient on a schedule.  Resource:  I have left the Dementia Resource guide with son for review.  Follow up:  Patient remains stable at this time.  Will have volunteer follow up with son next month and I will make a home visit in May.         CODE STATUS: Full ADVANCED DIRECTIVES: Yes MOST FORM: No PPS: 50%   PHYSICAL EXAM:   VITALS: Today's Vitals   04/16/22 1106  BP: (!) 112/58  Pulse: 65  Temp: (!) 97.5 F (36.4 C)  SpO2: 96%    LUNGS: clear to auscultation  CARDIAC: Cor RRR}  EXTREMITIES: 1+ bilateral lower extremity edema SKIN: Skin color, texture, turgor normal. No rashes or lesions or mobility and turgor normal  NEURO: positive for gait problems and memory problems       Lorenza Burton, RN

## 2022-04-28 ENCOUNTER — Other Ambulatory Visit: Payer: Medicare Other

## 2022-04-28 DIAGNOSIS — Z515 Encounter for palliative care: Secondary | ICD-10-CM

## 2022-04-28 NOTE — Progress Notes (Signed)
TELEPHONE ENCOUNTER  Palliative care SW connected with patients son, [name], who inquired about the placement process for patient.  Son shared that patient is becoming more immobile and weak.  During the time of this call, patients son was in the hospital with his own medical concerns. Son shared that he is interested in pursuing placement for patient due to patient requiring more assistance with care, as son is patients primary caregiver. Son too look into patients finances more and follow up with SW post his DC release.

## 2022-05-01 ENCOUNTER — Telehealth: Payer: Self-pay

## 2022-05-01 NOTE — Telephone Encounter (Signed)
PC SW returned patients son TC. Son shared that he has obtained an FL2 from patients PCP and will be looking to having patient placed at [facility].. SW ans son discussed next steps of placement process and that PC maybe able to continue to follow patient in facility, however it would not be the [facility] RN/SW team that will follow it will be a PC NP instead.. Son appreciative of call and feedback. SW will continue to monitor for final disposition.

## 2022-05-14 ENCOUNTER — Telehealth: Payer: Self-pay

## 2022-05-14 NOTE — Telephone Encounter (Signed)
Palliative Care Note  Palliative care check in call attempted by volunteer. No answer. Will try again at a later date.  Attempt #1  Khari Mally, RN 

## 2022-05-30 ENCOUNTER — Telehealth: Payer: Self-pay

## 2022-05-30 NOTE — Telephone Encounter (Signed)
4540 Palliative Care Note  On 05/20/22, volunteer attempted to contact pt for palliative care check in. No answer, went to voicemail.  This is attempt #2  Barbette Merino, RN

## 2022-06-06 ENCOUNTER — Telehealth: Payer: Self-pay

## 2022-06-06 NOTE — Telephone Encounter (Signed)
1744 Palliative Care Note  Volunteer called on 06/03/22 for palliative check in. Spoke with Trey Paula, the son.  He has noticed a decline in his father's ability to get out of bed and weaker when walking w/walker.  The son was hospitalized for eight days.  When he returned, he noticed that his father still seemed weaker. He has him walk 3-6 times a day to try and keep him active.  Patient has a doctor appt today.  Trey Paula is anxious to see whether he has gained weight and the assessment from the doctor.  He did have a fall and EMS was called to get him up.  (son was in hospital)  Appreciative for today's call and would like to receive future calls.  Gave him the Palliative Care number.  Barbette Merino, RN

## 2022-06-16 ENCOUNTER — Other Ambulatory Visit: Payer: Medicare Other

## 2022-06-16 VITALS — BP 118/58 | HR 70 | Temp 97.5°F

## 2022-06-16 DIAGNOSIS — Z515 Encounter for palliative care: Secondary | ICD-10-CM

## 2022-06-16 NOTE — Progress Notes (Signed)
PATIENT NAME: Kyle Spence DOB: [birth year] MRN: [id number]  PRIMARY CARE PROVIDER: Marisue Ivan, MD  RESPONSIBLE PARTY:  Acct ID - Guarantor Home Phone Work Phone Relationship Acct Type  [id number] KENY, ARMAND* [phone]  Self P/F     [address], [place], [place] [address]   Home visit completed with patient and son-Jeff  Dementia: 6C (FAST).  Patient is ambulatory with a rolling walker and one person assistance.  If son is not present, patient remains in the wheelchair.  Noted to have a fall recently.  Bruising noted to arms but no other apparent injuries.  Incontinent of bladder.  Still has control over bowels.  Short term memory deficits present.  Patient continues to engage in conversation easily.  Enjoys talking about his past and famous people he has meet over the years.   No behavorial issues reported by son.  Long Term Goal:  Son was considering ALF placement but this is currently on hold.  Son is considering moving in full time with patient.  Currently using [facility] for private sitting when needed.   CODE STATUS: Full ADVANCED DIRECTIVES: Yes MOST FORM: No PPS: 40%   PHYSICAL EXAM:   VITALS: Today's Vitals   06/16/22 1106  BP: (!) 118/58  Pulse: 70  Temp: (!) 97.5 F (36.4 C)  SpO2: 98%    LUNGS: clear to auscultation  CARDIAC: Cor RRR}  EXTREMITIES:trace edema SKIN: Skin color, texture, turgor normal. No rashes or lesions or mobility and turgor normal  NEURO: positive for gait problems, memory problems, and weakness       Truitt Merle, RN

## 2022-07-02 ENCOUNTER — Telehealth: Payer: Self-pay

## 2022-07-02 NOTE — Telephone Encounter (Signed)
1502 Palliative Care Note   Volunteer attempted to contact pt on 07/01/22 for palliative check in. No answer. LVM.   Barbette Merino, RN

## 2022-07-21 ENCOUNTER — Other Ambulatory Visit: Payer: Medicare Other

## 2022-09-09 IMAGING — MR MR HEAD W/O CM
11 series · 48 of 48 positions shown · non-contrast
Comparison: CT head 12/16/2020

CLINICAL DATA: Fall around Thanksgiving. Bilateral arm and hand
tremors.

EXAM:
MRI HEAD WITHOUT CONTRAST
TECHNIQUE: Multiplanar, multiecho pulse sequences of the brain and surrounding
structures were obtained without intravenous contrast.

[Series 5: ax dwi_tracew · axial · 3.0mm · 0.65mm/px · z∈[-103,+58]mm · 5 of 50 slices shown]
[im 1/50]
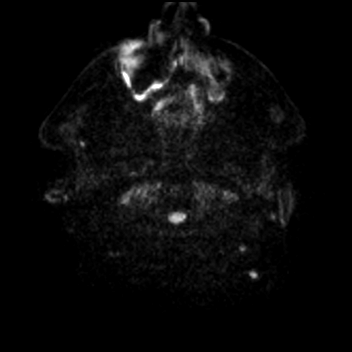
[im 13/50]
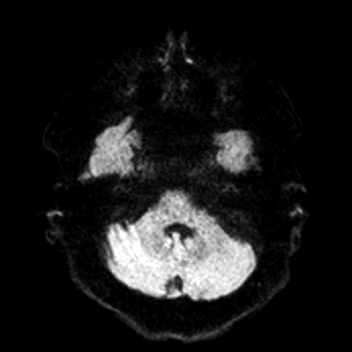
[im 25/50]
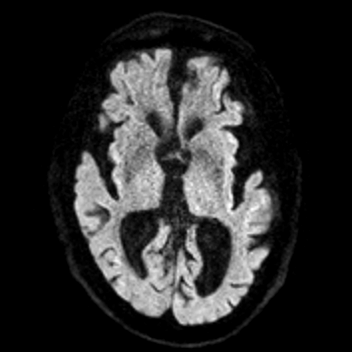
[im 37/50]
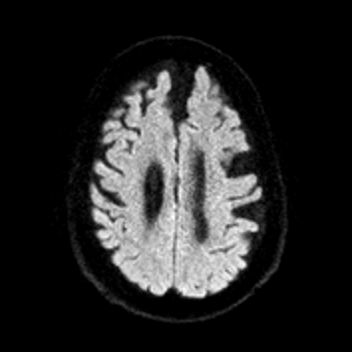
[im 50/50]
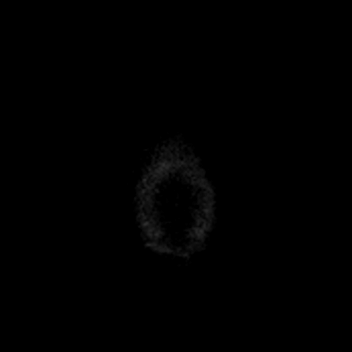

[Series 6: ax dwi_adc · axial · 3.0mm · 0.65mm/px · z∈[-103,+58]mm · 5 of 50 slices shown]
[im 1/50]
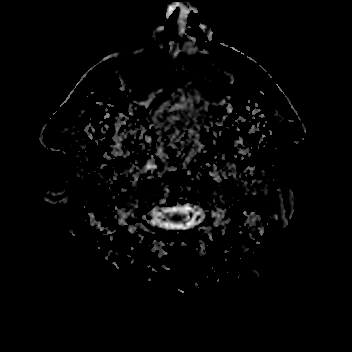
[im 13/50]
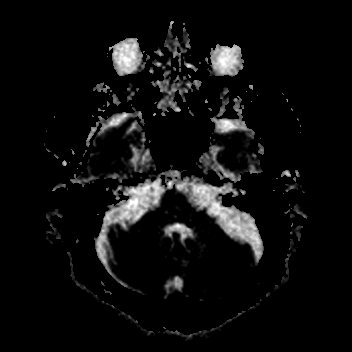
[im 25/50]
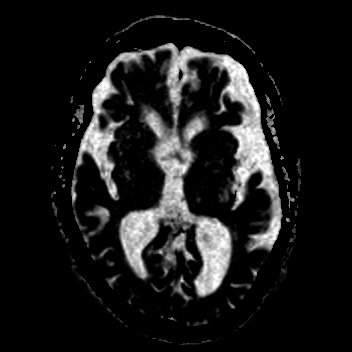
[im 37/50]
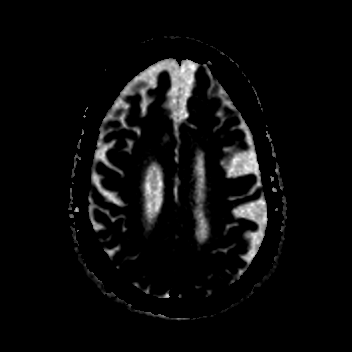
[im 50/50]
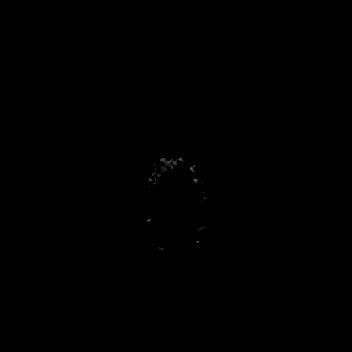

[Series 7: cor dwi_tracew · coronal · 5.0mm · 0.68mm/px · 3 of 40 slices shown]
[im 1/40]
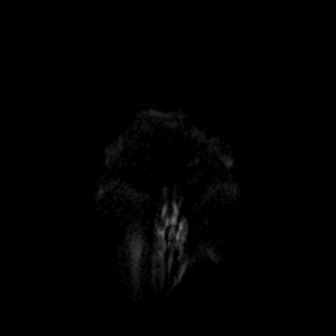
[im 20/40]
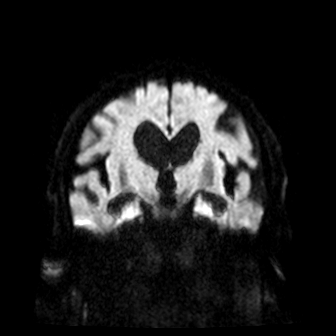
[im 40/40]
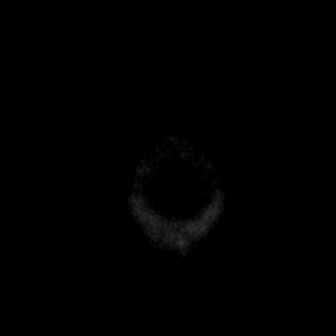

[Series 8: cor dwi_adc · coronal · 5.0mm · 0.68mm/px · 3 of 40 slices shown]
[im 1/40]
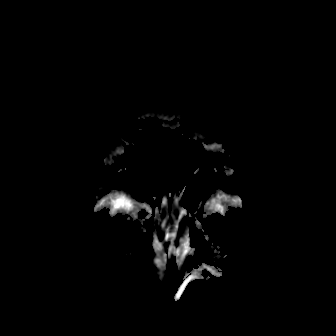
[im 20/40]
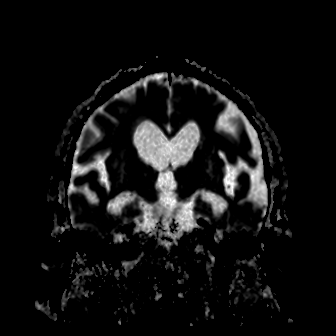
[im 40/40]
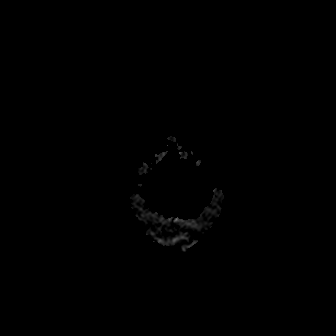

[Series 9: T1 · sagittal · 5.0mm · 0.62mm/px · 2 of 23 slices shown (1 of 2)]
[im 1/23]
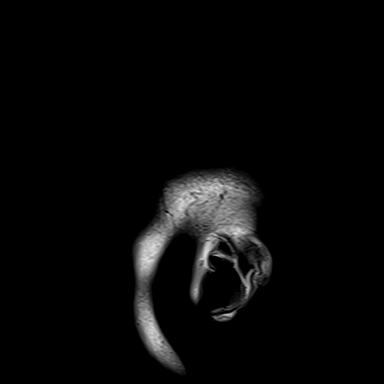
[im 23/23]
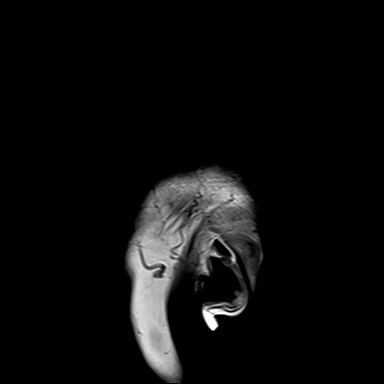

[Series 10: T2 · axial · 5.0mm · 0.53mm/px · z∈[-99,+57]mm · 2 of 27 slices shown (1 of 2)]
[im 1/27]
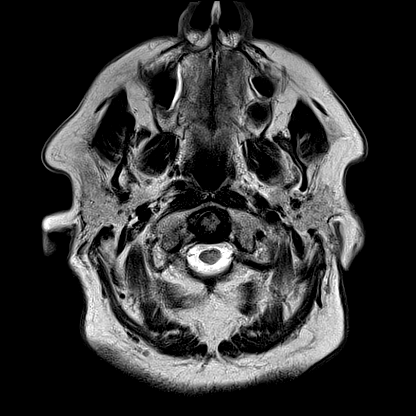
[im 27/27]
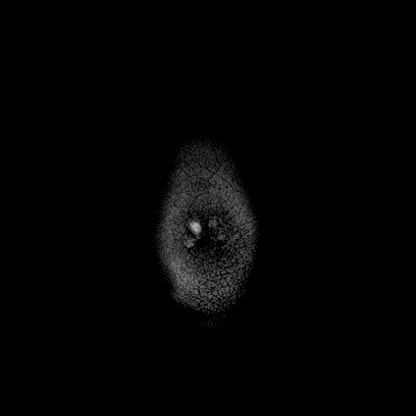

[Series 12: pha_images · axial · 3.0mm · 0.90mm/px · z∈[-103,+62]mm · 4 of 56 slices shown]
[im 1/56]
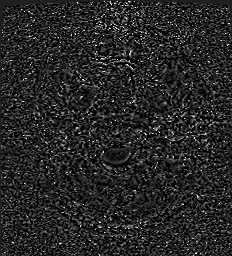
[im 19/56]
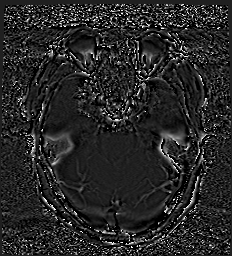
[im 37/56]
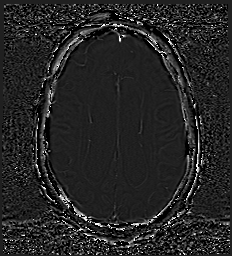
[im 56/56]
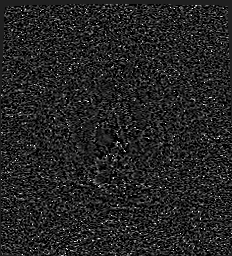

[Series 13: swi_images · axial · 3.0mm · 0.90mm/px · z∈[-103,+62]mm · 4 of 56 slices shown]
[im 1/56]
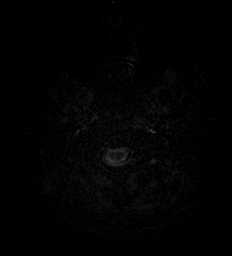
[im 19/56]
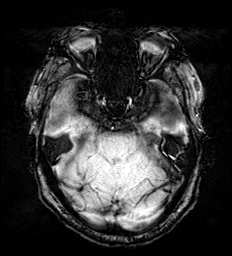
[im 37/56]
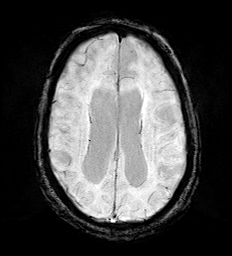
[im 56/56]
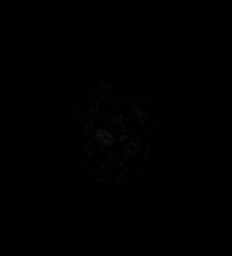

[Series 15: FLAIR · axial · 3.0mm · 0.53mm/px · z∈[-102,+60]mm · 4 of 55 slices shown]
[im 1/55]
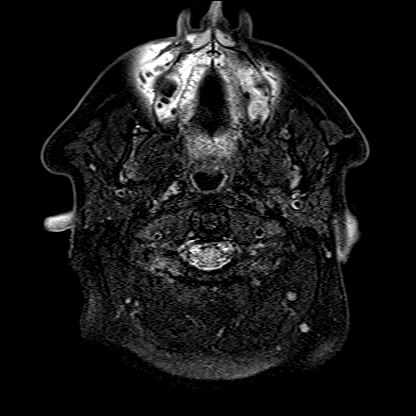
[im 19/55]
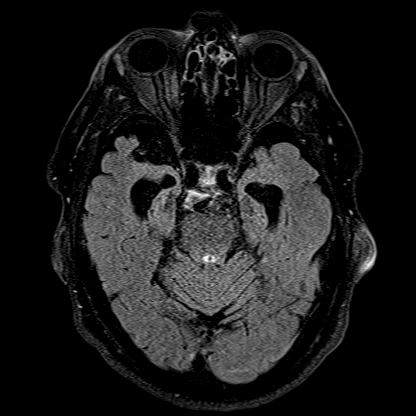
[im 37/55]
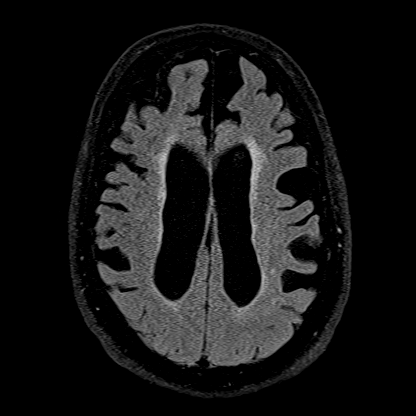
[im 55/55]
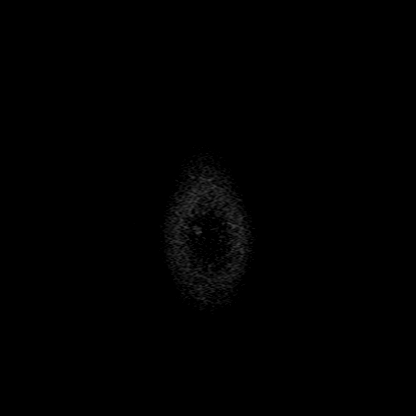

[Series 16: T1 · axial · 1.0mm · 0.98mm/px · z∈[-109,+66]mm · 14 of 176 slices shown (2 of 2)]
[im 1/176]
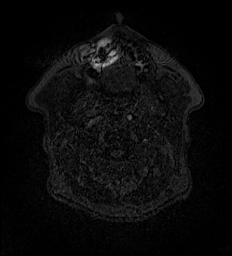
[im 14/176]
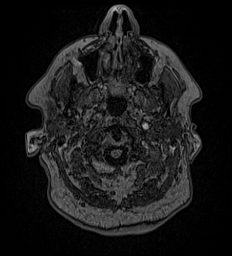
[im 27/176]
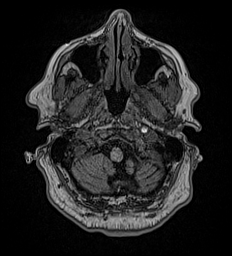
[im 41/176]
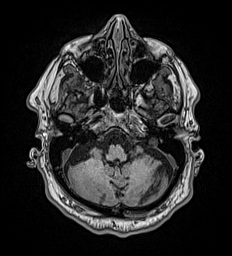
[im 54/176]
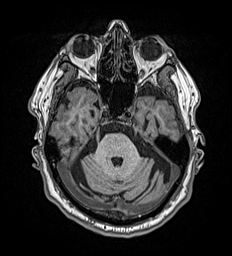
[im 68/176]
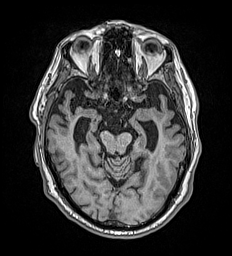
[im 81/176]
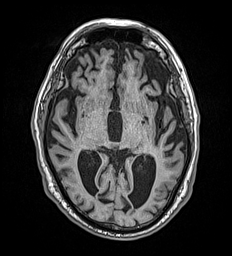
[im 95/176]
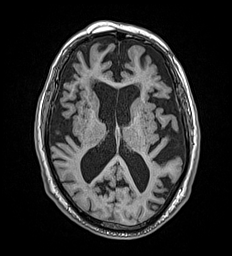
[im 108/176]
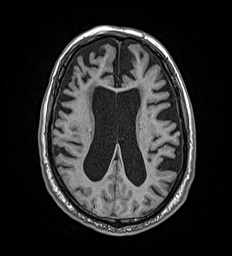
[im 122/176]
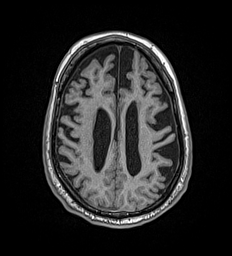
[im 135/176]
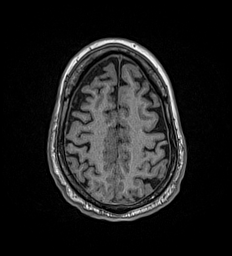
[im 149/176]
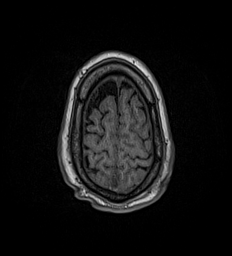
[im 162/176]
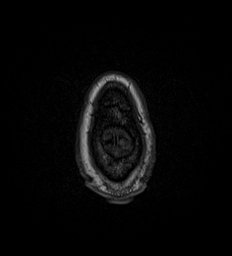
[im 176/176]
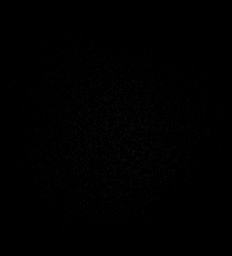

[Series 17: T2 · coronal · 5.0mm · 0.57mm/px · 2 of 31 slices shown (2 of 2)]
[im 1/31]
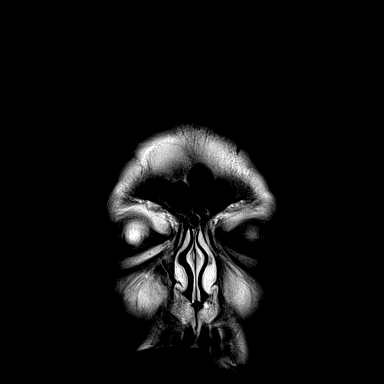
[im 31/31]
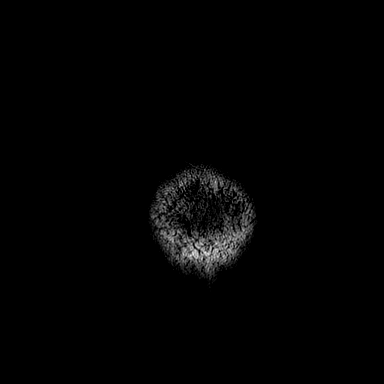

[48 of 48 positions shown; findings below may reference images not displayed]

FINDINGS: Brain: Moderate atrophy. Ventricular enlargement consistent with
atrophy. Atrophy most prominent the frontal and temporal lobes.

Mild periventricular white matter hyperintensity. Negative for acute
infarct, hemorrhage, or mass. No fluid collection.

Vascular: Normal arterial flow voids

Skull and upper cervical spine: No focal skeletal lesion.

Sinuses/Orbits: Mild mucosal edema paranasal sinuses. Bilateral
cataract extraction

Other: None
IMPRESSION: No acute abnormality.

Prominent atrophy most prominent in the frontal and temporal lobes.

Mild white matter changes.

## 2022-10-18 ENCOUNTER — Emergency Department: Payer: Medicare Other

## 2022-10-18 ENCOUNTER — Inpatient Hospital Stay
Admission: EM | Admit: 2022-10-18 | Discharge: 2022-10-21 | DRG: 640 | Disposition: A | Discharge status: Home Health | Payer: Medicare Other | Attending: Internal Medicine | Admitting: Internal Medicine

## 2022-10-18 DIAGNOSIS — M109 Gout, unspecified: Secondary | ICD-10-CM | POA: Diagnosis present

## 2022-10-18 DIAGNOSIS — M1990 Unspecified osteoarthritis, unspecified site: Secondary | ICD-10-CM | POA: Diagnosis present

## 2022-10-18 DIAGNOSIS — Z79899 Other long term (current) drug therapy: Secondary | ICD-10-CM

## 2022-10-18 DIAGNOSIS — Z6834 Body mass index (BMI) 34.0-34.9, adult: Secondary | ICD-10-CM

## 2022-10-18 DIAGNOSIS — E785 Hyperlipidemia, unspecified: Secondary | ICD-10-CM | POA: Diagnosis present

## 2022-10-18 DIAGNOSIS — R29810 Facial weakness: Secondary | ICD-10-CM | POA: Diagnosis present

## 2022-10-18 DIAGNOSIS — E871 Hypo-osmolality and hyponatremia: Secondary | ICD-10-CM | POA: Diagnosis not present

## 2022-10-18 DIAGNOSIS — R197 Diarrhea, unspecified: Secondary | ICD-10-CM | POA: Diagnosis present

## 2022-10-18 DIAGNOSIS — N1832 Chronic kidney disease, stage 3b: Secondary | ICD-10-CM | POA: Diagnosis present

## 2022-10-18 DIAGNOSIS — Z888 Allergy status to other drugs, medicaments and biological substances status: Secondary | ICD-10-CM

## 2022-10-18 DIAGNOSIS — Z66 Do not resuscitate: Secondary | ICD-10-CM | POA: Diagnosis present

## 2022-10-18 DIAGNOSIS — D72829 Elevated white blood cell count, unspecified: Secondary | ICD-10-CM | POA: Diagnosis present

## 2022-10-18 DIAGNOSIS — E669 Obesity, unspecified: Secondary | ICD-10-CM | POA: Diagnosis present

## 2022-10-18 DIAGNOSIS — Z7901 Long term (current) use of anticoagulants: Secondary | ICD-10-CM

## 2022-10-18 DIAGNOSIS — K5909 Other constipation: Secondary | ICD-10-CM | POA: Diagnosis present

## 2022-10-18 DIAGNOSIS — R112 Nausea with vomiting, unspecified: Secondary | ICD-10-CM | POA: Diagnosis present

## 2022-10-18 DIAGNOSIS — I129 Hypertensive chronic kidney disease with stage 1 through stage 4 chronic kidney disease, or unspecified chronic kidney disease: Secondary | ICD-10-CM | POA: Diagnosis present

## 2022-10-18 DIAGNOSIS — Z7982 Long term (current) use of aspirin: Secondary | ICD-10-CM

## 2022-10-18 DIAGNOSIS — F03A0 Unspecified dementia, mild, without behavioral disturbance, psychotic disturbance, mood disturbance, and anxiety: Secondary | ICD-10-CM | POA: Diagnosis present

## 2022-10-18 DIAGNOSIS — Z880 Allergy status to penicillin: Secondary | ICD-10-CM

## 2022-10-18 DIAGNOSIS — N400 Enlarged prostate without lower urinary tract symptoms: Secondary | ICD-10-CM | POA: Diagnosis present

## 2022-10-18 DIAGNOSIS — I739 Peripheral vascular disease, unspecified: Secondary | ICD-10-CM | POA: Diagnosis present

## 2022-10-18 DIAGNOSIS — Z96642 Presence of left artificial hip joint: Secondary | ICD-10-CM | POA: Diagnosis present

## 2022-10-18 DIAGNOSIS — R4182 Altered mental status, unspecified: Secondary | ICD-10-CM | POA: Diagnosis not present

## 2022-10-18 DIAGNOSIS — L8990 Pressure ulcer of unspecified site, unspecified stage: Secondary | ICD-10-CM | POA: Diagnosis present

## 2022-10-18 DIAGNOSIS — I10 Essential (primary) hypertension: Secondary | ICD-10-CM | POA: Diagnosis present

## 2022-10-18 DIAGNOSIS — G9341 Metabolic encephalopathy: Secondary | ICD-10-CM | POA: Diagnosis present

## 2022-10-18 DIAGNOSIS — R32 Unspecified urinary incontinence: Secondary | ICD-10-CM | POA: Diagnosis present

## 2022-10-18 LAB — CBC
HCT: 35.7 % — ABNORMAL LOW (ref 39.0–52.0)
Hemoglobin: 12.3 g/dL — ABNORMAL LOW (ref 13.0–17.0)
MCH: 32.4 pg (ref 26.0–34.0)
MCHC: 34.5 g/dL (ref 30.0–36.0)
MCV: 93.9 fL (ref 80.0–100.0)
Platelets: 231 10*3/uL (ref 150–400)
RBC: 3.8 MIL/uL — ABNORMAL LOW (ref 4.22–5.81)
RDW: 13.1 % (ref 11.5–15.5)
WBC: 16.8 10*3/uL — ABNORMAL HIGH (ref 4.0–10.5)
nRBC: 0 % (ref 0.0–0.2)

## 2022-10-18 LAB — DIFFERENTIAL
Abs Immature Granulocytes: 0.1 10*3/uL — ABNORMAL HIGH (ref 0.00–0.07)
Basophils Absolute: 0 10*3/uL (ref 0.0–0.1)
Basophils Relative: 0 %
Eosinophils Absolute: 0 10*3/uL (ref 0.0–0.5)
Eosinophils Relative: 0 %
Immature Granulocytes: 1 %
Lymphocytes Relative: 6 %
Lymphs Abs: 1 10*3/uL (ref 0.7–4.0)
Monocytes Absolute: 0.9 10*3/uL (ref 0.1–1.0)
Monocytes Relative: 5 %
Neutro Abs: 14.8 10*3/uL — ABNORMAL HIGH (ref 1.7–7.7)
Neutrophils Relative %: 88 %

## 2022-10-18 LAB — COMPREHENSIVE METABOLIC PANEL
ALT: 20 U/L (ref 0–44)
AST: 24 U/L (ref 15–41)
Albumin: 4.2 g/dL (ref 3.5–5.0)
Alkaline Phosphatase: 100 U/L (ref 38–126)
Anion gap: 12 (ref 5–15)
BUN: 26 mg/dL — ABNORMAL HIGH (ref 8–23)
CO2: 22 mmol/L (ref 22–32)
Calcium: 8.7 mg/dL — ABNORMAL LOW (ref 8.9–10.3)
Chloride: 86 mmol/L — ABNORMAL LOW (ref 98–111)
Creatinine, Ser: 1.28 mg/dL — ABNORMAL HIGH (ref 0.61–1.24)
GFR, Estimated: 54 mL/min — ABNORMAL LOW (ref >60)
Glucose, Bld: 135 mg/dL — ABNORMAL HIGH (ref 70–99)
Potassium: 4.9 mmol/L (ref 3.5–5.1)
Sodium: 120 mmol/L — ABNORMAL LOW (ref 135–145)
Total Bilirubin: 0.9 mg/dL (ref 0.3–1.2)
Total Protein: 6.8 g/dL (ref 6.5–8.1)

## 2022-10-18 LAB — CBG MONITORING, ED: Glucose-Capillary: 135 mg/dL — ABNORMAL HIGH (ref 70–99)

## 2022-10-18 LAB — PROTIME-INR
INR: 1.1 (ref 0.8–1.2)
Prothrombin Time: 14.8 seconds (ref 11.4–15.2)

## 2022-10-18 LAB — ETHANOL: Alcohol, Ethyl (B): <10 mg/dL (ref <10)

## 2022-10-18 LAB — APTT: aPTT: 32 seconds (ref 24–36)

## 2022-10-18 MED ORDER — SODIUM CHLORIDE 0.9 % IV BOLUS
1000.0000 mL | Freq: Once | INTRAVENOUS | Status: AC
  Administered 2022-10-18: 1000 mL via INTRAVENOUS

## 2022-10-18 MED ORDER — SODIUM CHLORIDE 0.9% FLUSH: 3.0000 mL | Freq: Once | INTRAVENOUS | Status: DC

## 2022-10-18 MED ORDER — METOCLOPRAMIDE HCL 5 MG/ML IJ SOLN
10.0000 mg | Freq: Once | INTRAMUSCULAR | Status: AC
  Administered 2022-10-18: 10 mg via INTRAVENOUS
  Filled 2022-10-18: qty 2

## 2022-10-18 NOTE — Progress Notes (Signed)
   10/18/22 2300  Spiritual Encounters  Type of Visit Initial  Referral source Code page  Reason for visit Code  OnCall Visit Yes   Chaplain responded to Code Stroke. Patient being attended to and there was no family present. Chaplain services available and thanks for allowing [place] to serve.

## 2022-10-18 NOTE — ED Notes (Signed)
Code Stroke called to [facility] by EMS

## 2022-10-18 NOTE — ED Notes (Signed)
Nerosurgery called per Dr. Anner Crete

## 2022-10-18 NOTE — ED Triage Notes (Signed)
Pt from home initial call out for /V.  EMS reports L sided weakness and aphasia upon arrival.  Last known normal 1300.

## 2022-10-18 NOTE — ED Provider Notes (Signed)
   [facility] Provider Note    Event Date/Time   First MD Initiated Contact with Patient 10/18/22 2300     (approximate)   History   No chief complaint on file.   HPI  Kyle Spence is a 89 y.o. male who presents to the ED for evaluation of No chief complaint on file.   Review of PCP visit from May.  History of dementia, HLD, CKD.   Patient presents to the ED from home via EMS.  EMS reported initial call out for vomiting.  They noted a left-sided facial droop and a field code stroke was called.  Patient arrived to the ED around signout in the beginning of my shift, previous physician cancels code stroke alert.   When I evaluate the patient has no complaints, concerns, pain.    Physical Exam   Triage Vital Signs: ED Triage Vitals  Encounter Vitals Group     BP      Systolic BP Percentile      Diastolic BP Percentile      Pulse      Resp      Temp      Temp src      SpO2      Weight      Height      Head Circumference      Peak Flow      Pain Score      Pain Loc      Pain Education      Exclude from Growth Chart     Most recent vital signs: There were no vitals filed for this visit.  General: Awake, no distress.  CV:  Good peripheral perfusion.  Resp:  Normal effort.  Abd:  No distention.  MSK:  No deformity noted.  Neuro:  No focal deficits appreciated.  Minimal left-sided facial droop, but able to raise eyebrows symmetrically and raise a symmetric smile. Cranial nerves otherwise intact, Full strength and sensation all 4 extremities without clear neurologic deficit Other:     ED Results / Procedures / Treatments   Labs (all labs ordered are listed, but only abnormal results are displayed) Labs Reviewed  CBG MONITORING, ED - Abnormal; Notable for the following components:      Result Value   Glucose-Capillary 135 (*)    All other components within normal limits  PROTIME-INR  APTT  CBC  DIFFERENTIAL  COMPREHENSIVE  METABOLIC PANEL  ETHANOL  CBG MONITORING, ED    EKG Sinus rhythm with a rate of 58 bpm.  Normal axis.  First-degree AV block with PR of 220.  No high-grade AV block.  No STEMI.  RADIOLOGY ***  Official radiology report(s): No results found.  PROCEDURES and INTERVENTIONS:  Procedures  Medications  sodium chloride flush (NS) 0.9 % injection 3 mL (has no administration in time range)     IMPRESSION / MDM / ASSESSMENT AND PLAN / ED COURSE  I reviewed the triage vital signs and the nursing notes.  Differential diagnosis includes, but is not limited to, ***  {Patient presents with symptoms of an acute illness or injury that is potentially life-threatening.}      FINAL CLINICAL IMPRESSION(S) / ED DIAGNOSES   Final diagnoses:  None     Rx / DC Orders   ED Discharge Orders     None        Note:  This document was prepared using Dragon voice recognition software and may include unintentional dictation errors.

## 2022-10-19 ENCOUNTER — Encounter: Payer: Self-pay | Admitting: Radiology

## 2022-10-19 ENCOUNTER — Other Ambulatory Visit: Payer: Self-pay

## 2022-10-19 ENCOUNTER — Inpatient Hospital Stay: Payer: Medicare Other

## 2022-10-19 DIAGNOSIS — I739 Peripheral vascular disease, unspecified: Secondary | ICD-10-CM | POA: Diagnosis present

## 2022-10-19 DIAGNOSIS — N1832 Chronic kidney disease, stage 3b: Secondary | ICD-10-CM | POA: Diagnosis present

## 2022-10-19 DIAGNOSIS — L8990 Pressure ulcer of unspecified site, unspecified stage: Secondary | ICD-10-CM | POA: Diagnosis present

## 2022-10-19 DIAGNOSIS — D72829 Elevated white blood cell count, unspecified: Secondary | ICD-10-CM | POA: Diagnosis not present

## 2022-10-19 DIAGNOSIS — R197 Diarrhea, unspecified: Secondary | ICD-10-CM

## 2022-10-19 DIAGNOSIS — I10 Essential (primary) hypertension: Secondary | ICD-10-CM | POA: Diagnosis not present

## 2022-10-19 DIAGNOSIS — R112 Nausea with vomiting, unspecified: Secondary | ICD-10-CM | POA: Diagnosis present

## 2022-10-19 DIAGNOSIS — Z66 Do not resuscitate: Secondary | ICD-10-CM | POA: Diagnosis present

## 2022-10-19 DIAGNOSIS — Z888 Allergy status to other drugs, medicaments and biological substances status: Secondary | ICD-10-CM | POA: Diagnosis not present

## 2022-10-19 DIAGNOSIS — Z7982 Long term (current) use of aspirin: Secondary | ICD-10-CM | POA: Diagnosis not present

## 2022-10-19 DIAGNOSIS — R4182 Altered mental status, unspecified: Secondary | ICD-10-CM | POA: Diagnosis present

## 2022-10-19 DIAGNOSIS — R29810 Facial weakness: Secondary | ICD-10-CM | POA: Diagnosis present

## 2022-10-19 DIAGNOSIS — N400 Enlarged prostate without lower urinary tract symptoms: Secondary | ICD-10-CM | POA: Diagnosis present

## 2022-10-19 DIAGNOSIS — E871 Hypo-osmolality and hyponatremia: Secondary | ICD-10-CM | POA: Diagnosis present

## 2022-10-19 DIAGNOSIS — I129 Hypertensive chronic kidney disease with stage 1 through stage 4 chronic kidney disease, or unspecified chronic kidney disease: Secondary | ICD-10-CM | POA: Diagnosis present

## 2022-10-19 DIAGNOSIS — M1990 Unspecified osteoarthritis, unspecified site: Secondary | ICD-10-CM | POA: Diagnosis present

## 2022-10-19 DIAGNOSIS — Z880 Allergy status to penicillin: Secondary | ICD-10-CM | POA: Diagnosis not present

## 2022-10-19 DIAGNOSIS — R32 Unspecified urinary incontinence: Secondary | ICD-10-CM | POA: Diagnosis present

## 2022-10-19 DIAGNOSIS — G9341 Metabolic encephalopathy: Secondary | ICD-10-CM | POA: Diagnosis present

## 2022-10-19 DIAGNOSIS — E785 Hyperlipidemia, unspecified: Secondary | ICD-10-CM

## 2022-10-19 DIAGNOSIS — K5909 Other constipation: Secondary | ICD-10-CM | POA: Diagnosis present

## 2022-10-19 DIAGNOSIS — M109 Gout, unspecified: Secondary | ICD-10-CM | POA: Diagnosis present

## 2022-10-19 DIAGNOSIS — E669 Obesity, unspecified: Secondary | ICD-10-CM | POA: Diagnosis present

## 2022-10-19 DIAGNOSIS — F03A0 Unspecified dementia, mild, without behavioral disturbance, psychotic disturbance, mood disturbance, and anxiety: Secondary | ICD-10-CM | POA: Diagnosis present

## 2022-10-19 DIAGNOSIS — Z6834 Body mass index (BMI) 34.0-34.9, adult: Secondary | ICD-10-CM | POA: Diagnosis not present

## 2022-10-19 DIAGNOSIS — Z96642 Presence of left artificial hip joint: Secondary | ICD-10-CM | POA: Diagnosis present

## 2022-10-19 DIAGNOSIS — Z7901 Long term (current) use of anticoagulants: Secondary | ICD-10-CM | POA: Diagnosis not present

## 2022-10-19 LAB — BASIC METABOLIC PANEL
Anion gap: 8 (ref 5–15)
Anion gap: 10 (ref 5–15)
Anion gap: 13 (ref 5–15)
BUN: 21 mg/dL (ref 8–23)
BUN: 19 mg/dL (ref 8–23)
BUN: 24 mg/dL — ABNORMAL HIGH (ref 8–23)
CO2: 24 mmol/L (ref 22–32)
CO2: 23 mmol/L (ref 22–32)
CO2: 20 mmol/L — ABNORMAL LOW (ref 22–32)
Calcium: 8.9 mg/dL (ref 8.9–10.3)
Calcium: 8.9 mg/dL (ref 8.9–10.3)
Calcium: 8.5 mg/dL — ABNORMAL LOW (ref 8.9–10.3)
Chloride: 89 mmol/L — ABNORMAL LOW (ref 98–111)
Chloride: 90 mmol/L — ABNORMAL LOW (ref 98–111)
Chloride: 89 mmol/L — ABNORMAL LOW (ref 98–111)
Creatinine, Ser: 1.17 mg/dL (ref 0.61–1.24)
Creatinine, Ser: 1.07 mg/dL (ref 0.61–1.24)
Creatinine, Ser: 1.3 mg/dL — ABNORMAL HIGH (ref 0.61–1.24)
GFR, Estimated: 60 mL/min — ABNORMAL LOW (ref >60)
GFR, Estimated: >60 mL/min (ref >60)
GFR, Estimated: 53 mL/min — ABNORMAL LOW (ref >60)
Glucose, Bld: 130 mg/dL — ABNORMAL HIGH (ref 70–99)
Glucose, Bld: 91 mg/dL (ref 70–99)
Glucose, Bld: 152 mg/dL — ABNORMAL HIGH (ref 70–99)
Potassium: 4.1 mmol/L (ref 3.5–5.1)
Potassium: 4.8 mmol/L (ref 3.5–5.1)
Potassium: 4.9 mmol/L (ref 3.5–5.1)
Sodium: 121 mmol/L — ABNORMAL LOW (ref 135–145)
Sodium: 123 mmol/L — ABNORMAL LOW (ref 135–145)
Sodium: 122 mmol/L — ABNORMAL LOW (ref 135–145)

## 2022-10-19 LAB — CBC
HCT: 37.4 % — ABNORMAL LOW (ref 39.0–52.0)
Hemoglobin: 13 g/dL (ref 13.0–17.0)
MCH: 32.3 pg (ref 26.0–34.0)
MCHC: 34.8 g/dL (ref 30.0–36.0)
MCV: 92.8 fL (ref 80.0–100.0)
Platelets: 236 10*3/uL (ref 150–400)
RBC: 4.03 MIL/uL — ABNORMAL LOW (ref 4.22–5.81)
RDW: 13 % (ref 11.5–15.5)
WBC: 16.4 10*3/uL — ABNORMAL HIGH (ref 4.0–10.5)
nRBC: 0 % (ref 0.0–0.2)

## 2022-10-19 LAB — URINALYSIS, W/ REFLEX TO CULTURE (INFECTION SUSPECTED)
Bilirubin Urine: NEGATIVE
Glucose, UA: NEGATIVE mg/dL
Ketones, ur: NEGATIVE mg/dL
Nitrite: NEGATIVE
Protein, ur: NEGATIVE mg/dL
Specific Gravity, Urine: 1.005 (ref 1.005–1.030)
pH: 8 (ref 5.0–8.0)

## 2022-10-19 LAB — SODIUM, URINE, RANDOM: Sodium, Ur: 75 mmol/L

## 2022-10-19 LAB — CBG MONITORING, ED: Glucose-Capillary: 121 mg/dL — ABNORMAL HIGH (ref 70–99)

## 2022-10-19 LAB — OSMOLALITY: Osmolality: 257 mOsm/kg — ABNORMAL LOW (ref 275–295)

## 2022-10-19 LAB — OSMOLALITY, URINE: Osmolality, Ur: 254 mOsm/kg — ABNORMAL LOW (ref 300–900)

## 2022-10-19 MED ORDER — SODIUM CHLORIDE 0.9 % IV SOLN
1.0000 g | INTRAVENOUS | Status: DC
  Administered 2022-10-19 – 2022-10-20 (×2): 1 g via INTRAVENOUS
  Filled 2022-10-19 (×3): qty 10

## 2022-10-19 MED ORDER — ADULT MULTIVITAMIN W/MINERALS CH
1.0000 | ORAL_TABLET | Freq: Every day | ORAL | Status: DC
  Administered 2022-10-19 – 2022-10-21 (×3): 1 via ORAL
  Filled 2022-10-19 (×3): qty 1

## 2022-10-19 MED ORDER — SODIUM CHLORIDE 1 G PO TABS
1.0000 g | ORAL_TABLET | Freq: Two times a day (BID) | ORAL | Status: DC
  Administered 2022-10-19 – 2022-10-21 (×5): 1 g via ORAL
  Filled 2022-10-19 (×6): qty 1

## 2022-10-19 MED ORDER — ONDANSETRON HCL 4 MG/2ML IJ SOLN
4.0000 mg | Freq: Three times a day (TID) | INTRAMUSCULAR | Status: DC | PRN
  Administered 2022-10-19: 4 mg via INTRAVENOUS
  Filled 2022-10-19: qty 2

## 2022-10-19 MED ORDER — VERAPAMIL HCL ER 240 MG PO TBCR
240.0000 mg | EXTENDED_RELEASE_TABLET | Freq: Every day | ORAL | Status: DC
  Administered 2022-10-19 – 2022-10-21 (×3): 240 mg via ORAL
  Filled 2022-10-19 (×3): qty 1

## 2022-10-19 MED ORDER — HYDROCODONE-ACETAMINOPHEN 5-325 MG PO TABS: 1.0000 | ORAL_TABLET | Freq: Three times a day (TID) | ORAL | Status: DC | PRN

## 2022-10-19 MED ORDER — OXYBUTYNIN CHLORIDE ER 5 MG PO TB24
5.0000 mg | ORAL_TABLET | Freq: Every day | ORAL | Status: DC
  Administered 2022-10-19 – 2022-10-21 (×3): 5 mg via ORAL
  Filled 2022-10-19 (×3): qty 1

## 2022-10-19 MED ORDER — TAMSULOSIN HCL 0.4 MG PO CAPS
0.4000 mg | ORAL_CAPSULE | Freq: Every day | ORAL | Status: DC
  Administered 2022-10-19 – 2022-10-21 (×3): 0.4 mg via ORAL
  Filled 2022-10-19 (×3): qty 1

## 2022-10-19 MED ORDER — ATORVASTATIN CALCIUM 20 MG PO TABS
40.0000 mg | ORAL_TABLET | Freq: Every day | ORAL | Status: DC
  Administered 2022-10-19 – 2022-10-21 (×3): 40 mg via ORAL
  Filled 2022-10-19 (×3): qty 2

## 2022-10-19 MED ORDER — ENOXAPARIN SODIUM 60 MG/0.6ML IJ SOSY
55.0000 mg | PREFILLED_SYRINGE | INTRAMUSCULAR | Status: DC
  Administered 2022-10-19: 55 mg via SUBCUTANEOUS
  Filled 2022-10-19: qty 0.6

## 2022-10-19 MED ORDER — ASPIRIN 81 MG PO TBEC
81.0000 mg | DELAYED_RELEASE_TABLET | Freq: Every day | ORAL | Status: DC
  Administered 2022-10-19 – 2022-10-21 (×3): 81 mg via ORAL
  Filled 2022-10-19 (×3): qty 1

## 2022-10-19 MED ORDER — SODIUM CHLORIDE 0.9 % IV SOLN: INTRAVENOUS | Status: DC

## 2022-10-19 MED ORDER — ACETAMINOPHEN 325 MG PO TABS: 650.0000 mg | ORAL_TABLET | Freq: Four times a day (QID) | ORAL | Status: DC | PRN

## 2022-10-19 MED ORDER — ENOXAPARIN SODIUM 40 MG/0.4ML IJ SOSY
40.0000 mg | PREFILLED_SYRINGE | INTRAMUSCULAR | Status: DC
  Administered 2022-10-20 – 2022-10-21 (×2): 40 mg via SUBCUTANEOUS
  Filled 2022-10-19 (×2): qty 0.4

## 2022-10-19 MED ORDER — GLUCOSAMINE-CHONDROITIN 500-400 MG PO TABS: 1.0000 | ORAL_TABLET | Freq: Three times a day (TID) | ORAL | Status: DC

## 2022-10-19 MED ORDER — ACETAMINOPHEN 650 MG RE SUPP: 650.0000 mg | Freq: Four times a day (QID) | RECTAL | Status: DC | PRN

## 2022-10-19 MED ORDER — HYDRALAZINE HCL 20 MG/ML IJ SOLN: 5.0000 mg | INTRAMUSCULAR | Status: DC | PRN

## 2022-10-19 MED ORDER — ALLOPURINOL 100 MG PO TABS
100.0000 mg | ORAL_TABLET | Freq: Every day | ORAL | Status: DC
  Administered 2022-10-19 – 2022-10-21 (×3): 100 mg via ORAL
  Filled 2022-10-19 (×3): qty 1

## 2022-10-19 NOTE — ED Notes (Signed)
Advised nurse that patient has ready bed

## 2022-10-19 NOTE — Progress Notes (Signed)
Patient is seen and examined today morning. Patient is admitted for evaluation of altered mental status, rule out stroke, hyponatremia. He is eating better. Son at bedside with whom I discussed his lab/ imaging results. ,MRI brain negative for stroke. Sodium slowly improving.  Continue to trend sodium, gentle IV hydration. Check orthostatic vitals. PT evaluation prior to discharge.

## 2022-10-19 NOTE — Plan of Care (Signed)

## 2022-10-19 NOTE — ED Notes (Signed)
Pt saturated with urine through two briefs and two pads. Pt cleaned of urine, new sheet, chuck pad and brief placed. Pt then began urinating during changing. Pt again cleaned, new sheet, brief placed. Pt then had small BM. Again, pt cleaned, brief/chuck pad/sheet replaced.

## 2022-10-19 NOTE — H&P (Addendum)
History and Physical    Kyle Spence ZOX:[id number] DOB: 12-May-1932 DOA: 10/18/2022  Referring MD/NP/PA:   PCP: Marisue Ivan, MD   Patient coming from:  The patient is coming from home.     Chief Complaint: AMS,   HPI: Kyle Spence is a 89 y.o. male with medical history significant of hypertension, hyperlipidemia, PVD, gout, mild dementia, CKD stage IIIb, BPH, who presents with altered mental status, nausea, vomiting, diarrhea.  Patient has hx AMS,  and is unable to provide accurate medical history. Per his son and daughter-in-law at bedside, patient was last known normal at about 2 PM, but at 6 PM patient is noted to be confused and have weakness in both legs and possible left arm weakness.  Patient also had mild left facial droop per his daughter-in-law.  At normal baseline, patient is alert and orientated x 3.  He can use walker to walk.  When I saw patient in ED, he is confused, he is orientated to the person and place, but not to time.  On my examination, patient does not have facial droop.  He has weakness in both legs, no weakness in arms on my examination.  Per his son, patient has nausea and vomited twice with nonbilious nonbloody vomiting and has watery diarrhea today.  No abdominal pain.  Patient has chronic urinary incontinence, not sure if patient has symptoms of UTI.  Patient does not have chest pain, cough, shortness of breath.   Data reviewed independently and ED Course: pt was found to have sodium 120, stable renal function, WBC 16.8, INR 1.1, PTT 32, alcohol level less than 10, INR 1.1, PTT 32, temperature 99, blood pressure 200/81, 175/76, RR 67, RR 21, oxygen sat 97% on room air.  Chest x-ray negative.  CT of head negative.  Patient is placed on telemetry bed for observation.   EKG: I have personally reviewed.  Sinus rhythm, QTc 453, early R wave progression   Review of Systems: Could not be reviewed accurately due to altered mental status.   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 He has never used smokeless tobacco. He reports that he does not drink alcohol and does not use drugs.  Family History:  Family History  Problem Relation Age of Onset   Prostate cancer Neg Hx    Kidney cancer Neg Hx    Bladder Cancer Neg Hx      Prior to Admission medications   Medication Sig Start Date End  Date Taking? Authorizing Provider  acetaminophen (TYLENOL) 325 MG tablet Take 1-2 tablets (325-650 mg total) by mouth every 6 (six) hours as needed for mild pain (pain score 1-3 or temp > 100.5). 12/19/20   Lurene Shadow, MD  allopurinol (ZYLOPRIM) 100 MG tablet Take 100 mg by mouth daily. 01/20/15   [provider]  aspirin EC 81 MG tablet Take 81 mg by mouth daily.    [provider]  atorvastatin (LIPITOR) 40 MG tablet Take 40 mg by mouth daily. 12/28/14   [provider]  docusate sodium (COLACE) 50 MG capsule Take by mouth.    [provider]  enalapril  (VASOTEC) 20 MG tablet  12/28/14   [provider]  enoxaparin (LOVENOX) 30 MG/0.3ML injection Inject 0.3 mLs (30 mg total) into the skin daily for 14 days. 12/20/20 01/03/21  Lurene Shadow, MD  ferrous sulfate 325 (65 FE) MG tablet Take 1 tablet (325 mg total) by mouth every other day. 12/19/20 02/17/21  Lurene Shadow, MD  furosemide (LASIX) 20 MG tablet Take 1 tablet (20 mg total) by mouth daily. 12/20/20   Lurene Shadow, MD  glucosamine-chondroitin 500-400 MG tablet Take 1 tablet by mouth 3 (three) times daily.    [provider]  Glycerin-Hypromellose-PEG 400 0.2-0.2-1 % SOLN Apply to eye.    [provider]  HYDROcodone-acetaminophen (NORCO/VICODIN) 5-325 MG tablet Take 1 tablet by mouth every 8 (eight) hours as needed for moderate pain. 12/19/20   Lurene Shadow, MD  mirabegron ER (MYRBETRIQ) 25 MG TB24 tablet Take 1 tablet (25 mg total) by mouth daily. Patient not taking: Reported on 12/17/2020 03/26/15   Hildred Laser, MD  Multiple Vitamin (MULTI-VITAMINS) TABS Take 1 tablet by mouth daily.    [provider]  Nutritional Supplements (PROSTATE 2.4) CAPS Take by mouth.    [provider]  tamsulosin (FLOMAX) 0.4 MG CAPS capsule Take 0.4 mg by mouth daily.    [provider]  verapamil (VERELAN PM) 240 MG 24 hr capsule Take 1 capsule (240 mg total) by mouth daily. 12/19/20   Lurene Shadow, MD    Physical Exam: Vitals:   10/18/22 2313 10/18/22 2315 10/19/22 0000  BP: (!) 200/81  (!) 175/76  Pulse: 67  65  Resp: 18  (!) 21  Temp: 97.7 F (36.5 C)    TempSrc: Oral    SpO2: 99%  99%  Weight:  108.9 kg   Height:  5\' 10"  (1.778 m)    General: Not in acute distress HEENT:       Eyes: PERRL, EOMI, no jaundice       ENT: No discharge from the ears and nose       Neck: No JVD, no bruit, no mass felt. Heme: No neck lymph node enlargement. Cardiac: S1/S2, RRR, No murmurs, No gallops or rubs. Respiratory: No rales, wheezing,  rhonchi or rubs. GI: Soft, nondistended, nontender, no organomegaly, BS present. GU: No hematuria Ext: 1+ pitting leg edema bilaterally.  Difficult to palpate DP/PT pulse bilaterally, but pulses are easily detectable by portable Doppler bilaterally. Both lower legs and feet are cool to touch.  Right foot has purple discoloration. Musculoskeletal: No joint deformities, No joint redness or warmth, no limitation of ROM in spin. Skin: No rashes.  Neuro: Confused, orientated to person and place, but not to time oriented X3, cranial nerves II-XII grossly intact. Muscle strength 5/5 in both arms, 3/5 in both legs, sensation to light touch intact.  Psych: Patient is not  psychotic, no suicidal or hemocidal ideation.  Labs on Admission: I have personally reviewed following labs and imaging studies  CBC: Recent Labs  Lab 10/18/22 2257  WBC 16.8*  NEUTROABS 14.8*  HGB 12.3*  HCT 35.7*  MCV 93.9  PLT 231   Basic Metabolic Panel: Recent Labs  Lab 10/18/22 2257  NA 120*  K 4.9  CL 86*  CO2 22  GLUCOSE 135*  BUN 26*  CREATININE 1.28*  CALCIUM 8.7*   GFR: Estimated Creatinine Clearance: 49.3 mL/min (A) (by C-G formula based on SCr of 1.28 mg/dL (H)). Liver Function Tests: Recent Labs  Lab 10/18/22 2257  AST 24  ALT 20  ALKPHOS 100  BILITOT 0.9  PROT 6.8  ALBUMIN 4.2   No results for input(s): "LIPASE", "AMYLASE" in the last 168 hours. No results for input(s): "AMMONIA" in the last 168 hours. Coagulation Profile: Recent Labs  Lab 10/18/22 2257  INR 1.1   Cardiac Enzymes: No results for input(s): "CKTOTAL", "CKMB", "CKMBINDEX", "TROPONINI" in the last 168 hours. BNP (last 3 results) No results for input(s): "PROBNP" in the last 8760 hours. HbA1C: No results for input(s): "HGBA1C" in the last 72 hours. CBG: Recent Labs  Lab 10/18/22 2255  GLUCAP 135*   Lipid Profile: No results for input(s): "CHOL", "HDL", "LDLCALC", "TRIG", "CHOLHDL", "LDLDIRECT" in the last 72  hours. Thyroid Function Tests: No results for input(s): "TSH", "T4TOTAL", "FREET4", "T3FREE", "THYROIDAB" in the last 72 hours. Anemia Panel: No results for input(s): "VITAMINB12", "FOLATE", "FERRITIN", "TIBC", "IRON", "RETICCTPCT" in the last 72 hours. Urine analysis:    Component Value Date/Time   COLORURINE YELLOW (A) 12/16/2020 1606   APPEARANCEUR CLOUDY (A) 12/16/2020 1606   APPEARANCEUR Clear 03/09/2015 1333   LABSPEC 1.017 12/16/2020 1606   PHURINE 6.0 12/16/2020 1606   GLUCOSEU NEGATIVE 12/16/2020 1606   HGBUR NEGATIVE 12/16/2020 1606   BILIRUBINUR NEGATIVE 12/16/2020 1606   BILIRUBINUR Negative 03/09/2015 1333   KETONESUR NEGATIVE 12/16/2020 1606   PROTEINUR 100 (A) 12/16/2020 1606   NITRITE NEGATIVE 12/16/2020 1606   LEUKOCYTESUR LARGE (A) 12/16/2020 1606   Sepsis Labs: @LABRCNTIP (procalcitonin:4,lacticidven:4) )No results found for this or any previous visit (from the past 240 hour(s)).   Radiological Exams on Admission: CT HEAD WO CONTRAST ( )  Result Date: 10/18/2022 CLINICAL DATA:  Acute stroke suspected, left-sided weakness with facial droop. EXAM: CT HEAD WITHOUT CONTRAST TECHNIQUE: Contiguous axial images were obtained from the base of the skull through the vertex without intravenous contrast. RADIATION DOSE REDUCTION: This exam was performed according to the departmental dose-optimization program which includes automated exposure control, adjustment of the mA and/or kV according to patient size and/or use of iterative reconstruction technique. COMPARISON:  MRI head 02/15/2021.  CT head 12/16/2020. FINDINGS: Brain: No evidence of acute infarction, hemorrhage, hydrocephalus, extra-axial collection or mass lesion/mass effect. Again seen is moderate diffuse atrophy and mild periventricular white matter hypodensity, likely chronic small vessel ischemic change. Vascular: Atherosclerotic calcifications are present within the cavernous internal carotid arteries. Skull:  Normal. Negative for fracture or focal lesion. Sinuses/Orbits: No acute finding. Other: None. IMPRESSION: 1. No acute intracranial abnormality. 2. Moderate diffuse atrophy and mild chronic small vessel ischemic change. Electronically Signed   By: Darliss Cheney M.D.   On: 10/18/2022 23:52   DG Chest Portable 1 View  Result Date: 10/18/2022 CLINICAL DATA:  Altered mental status EXAM: PORTABLE CHEST 1 VIEW COMPARISON:  Chest x-ray 12/16/2020 FINDINGS: The heart size and mediastinal contours are within normal limits. Both lungs are clear. The  visualized skeletal structures are unremarkable. IMPRESSION: No active disease. Electronically Signed   By: Darliss Cheney M.D.   On: 10/18/2022 23:38      Assessment/Plan Principal Problem:   Acute metabolic encephalopathy Active Problems:   Nausea vomiting and diarrhea   Hyponatremia   Leukocytosis   Essential (primary) hypertension   Hyperlipidemia   Peripheral artery disease (HCC)   Stage 3b chronic kidney disease (CKD) (HCC)   Mild dementia without behavioral disturbance, psychotic disturbance, mood disturbance, or anxiety (HCC)   Gout   Obesity (BMI 30-39.9)   BPH (benign prostatic hyperplasia)   Assessment and Plan:  Acute metabolic encephalopathy: Etiology is not clear.  CT head negative.  Family reports possible left arm weakness and left facial droop, will need to rule out stroke.  Other differential diagnosis include electrolyte disturbance with hyponatremia and UTI.  -Placed on telemetry bed for observation -Follow-up MRI of brain to rule out stroke -Follow-up urinalysis -Fall precaution -Neuro checks frequently -Correct hyponatremia as below  Nausea vomiting and diarrhea: No abdominal pain -As needed Zofran -IV fluid: Patient received 1 L LR, then normal saline 75 cc/h -Follow-up C. difficile test  Hyponatremia: Na 120. - Will check urine sodium, urine osmolality, serum osmolality. - Fluid restriction - IVF: 1L LR in ED, will  continue with IV normal saline at 75 mL/h - Sodium chloride tablet 1 g twice daily - f/u by BMP q8h - avoid over correction too fast due to risk of central pontine myelinolysis  Leukocytosis: WBC 16.8. CXR negative -f/u UA -f/u blood culture -f/u C diff  Essential (primary) hypertension -IV hydralazine as needed -Verapamil -Hold Lasix due to hyponatremia  Hyperlipidemia -Lipitor  Peripheral artery disease (HCC): Patient has dopplerable pulse bilaterally -On aspirin, Lipitor -Needs to follow-up with her vascular surgeon  Stage 3b chronic kidney disease (CKD) (HCC): Stable renal function.  Recent creatinine 1 point 06/26/2022.  His creatinine 7.28, BUN 26, GFR 54 -Follow-up with BMP  Mild dementia without behavioral disturbance, psychotic disturbance, mood disturbance, or anxiety (HCC) -Fall precaution  Gout -Allopurinol  Obesity (BMI 30-39.9): WBC while 8.9 kg, BMI 34.44 -Encourage losing weight -Exercise and healthy diet  BPH: -Continue Flomax and oxybutynin     DVT ppx: SQ Lovenox  Code Status: DNR per his son  Family Communication:  Yes, patient's son and daughter-in-law   at bed side.    Disposition Plan:  Anticipate discharge back to previous environment  Consults called: None  Admission status and Level of care: Telemetry Medical:    for obs    Dispo: The patient is from: Home              Anticipated d/c is to: Home              Anticipated d/c date is: 2 days              Patient currently is not medically stable to d/c.    Severity of Illness:  The appropriate patient status for this patient is INPATIENT. Inpatient status is judged to be reasonable and necessary in order to provide the required intensity of service to ensure the patient's safety. The patient's presenting symptoms, physical exam findings, and initial radiographic and laboratory data in the context of their chronic comorbidities is felt to place them at high risk for further  clinical deterioration. Furthermore, it is not anticipated that the patient will be medically stable for discharge from the hospital within 2 midnights of admission.   *  I certify that at the point of admission it is my clinical judgment that the patient will require inpatient hospital care spanning beyond 2 midnights from the point of admission due to high intensity of service, high risk for further deterioration and high frequency of surveillance required.*       Date of Service 10/19/2022    Lorretta Harp [facility]   If 7PM-7AM, please contact night-coverage [email or web address] 10/19/2022, 1:44 AM Cool to touch

## 2022-10-19 NOTE — Progress Notes (Signed)
PHARMACIST - PHYSICIAN COMMUNICATION  CONCERNING:  Enoxaparin (Lovenox) for DVT Prophylaxis    RECOMMENDATION: Patient was prescribed enoxaprin 40mg  q24 hours for VTE prophylaxis.   Filed Weights   10/18/22 2315  Weight: 108.9 kg (240 lb)    Body mass index is 34.44 kg/m.  Estimated Creatinine Clearance: 49.3 mL/min (A) (by C-G formula based on SCr of 1.28 mg/dL (H)).   Based on [facility] policy patient is candidate for enoxaparin 0.5mg /kg TBW SQ every 24 hours based on BMI being >30.  DESCRIPTION: Pharmacy has adjusted enoxaparin dose per [facility] policy.  Patient is now receiving enoxaparin 0.5 mg/kg every 24 hours   Otelia Sergeant, PharmD, [facility] 10/19/2022 1:48 AM

## 2022-10-20 DIAGNOSIS — E871 Hypo-osmolality and hyponatremia: Secondary | ICD-10-CM | POA: Diagnosis not present

## 2022-10-20 DIAGNOSIS — I10 Essential (primary) hypertension: Secondary | ICD-10-CM | POA: Diagnosis not present

## 2022-10-20 DIAGNOSIS — R112 Nausea with vomiting, unspecified: Secondary | ICD-10-CM | POA: Diagnosis not present

## 2022-10-20 DIAGNOSIS — E782 Mixed hyperlipidemia: Secondary | ICD-10-CM

## 2022-10-20 DIAGNOSIS — G9341 Metabolic encephalopathy: Secondary | ICD-10-CM | POA: Diagnosis not present

## 2022-10-20 LAB — BASIC METABOLIC PANEL
Anion gap: 11 (ref 5–15)
Anion gap: 8 (ref 5–15)
BUN: 23 mg/dL (ref 8–23)
BUN: 22 mg/dL (ref 8–23)
CO2: 21 mmol/L — ABNORMAL LOW (ref 22–32)
CO2: 24 mmol/L (ref 22–32)
Calcium: 8.4 mg/dL — ABNORMAL LOW (ref 8.9–10.3)
Calcium: 8.6 mg/dL — ABNORMAL LOW (ref 8.9–10.3)
Chloride: 94 mmol/L — ABNORMAL LOW (ref 98–111)
Chloride: 98 mmol/L (ref 98–111)
Creatinine, Ser: 1.21 mg/dL (ref 0.61–1.24)
Creatinine, Ser: 1.16 mg/dL (ref 0.61–1.24)
GFR, Estimated: 58 mL/min — ABNORMAL LOW (ref >60)
GFR, Estimated: >60 mL/min (ref >60)
Glucose, Bld: 80 mg/dL (ref 70–99)
Glucose, Bld: 78 mg/dL (ref 70–99)
Potassium: 4.5 mmol/L (ref 3.5–5.1)
Potassium: 4 mmol/L (ref 3.5–5.1)
Sodium: 126 mmol/L — ABNORMAL LOW (ref 135–145)
Sodium: 128 mmol/L — ABNORMAL LOW (ref 135–145)

## 2022-10-20 LAB — URINE CULTURE

## 2022-10-20 LAB — C DIFFICILE QUICK SCREEN W PCR REFLEX
C Diff antigen: NEGATIVE
C Diff interpretation: NOT DETECTED
C Diff toxin: NEGATIVE

## 2022-10-20 LAB — CBC
HCT: 32.6 % — ABNORMAL LOW (ref 39.0–52.0)
Hemoglobin: 11.6 g/dL — ABNORMAL LOW (ref 13.0–17.0)
MCH: 32.9 pg (ref 26.0–34.0)
MCHC: 35.6 g/dL (ref 30.0–36.0)
MCV: 92.4 fL (ref 80.0–100.0)
Platelets: 216 10*3/uL (ref 150–400)
RBC: 3.53 MIL/uL — ABNORMAL LOW (ref 4.22–5.81)
RDW: 13 % (ref 11.5–15.5)
WBC: 9.1 10*3/uL (ref 4.0–10.5)
nRBC: 0 % (ref 0.0–0.2)

## 2022-10-20 NOTE — Plan of Care (Signed)

## 2022-10-20 NOTE — Progress Notes (Signed)
Progress Note   Patient: Kyle Spence:[id number] DOB: 1932/12/22 DOA: 10/18/2022     1 DOS: the patient was seen and examined on 10/20/2022   Brief hospital course: Kyle Spence is a 89 y.o. male with medical history significant of hypertension, hyperlipidemia, PVD, gout, mild dementia, CKD stage IIIb, BPH, who presents with altered mental status, nausea, vomiting, diarrhea. Per family patient is noted to be confused with weakness in both legs and possible left arm weakness, had mild left facial droop.  Patient is admitted for further management evaluation of rule out stroke, hyponatremia, possible UTI.  Assessment and Plan: Acute metabolic encephalopathy:  Multifactorial in the setting of electrolyte disturbance with hyponatremia and UTI. MRI of brain negative for stroke Continue Rocephin for possible UTI. Continue to correct hyponatremia as below   Nausea vomiting and diarrhea: No abdominal pain Zofran PRN He got IV fluids, will stop them now as he is eating well. C. difficile test negative   Hyponatremia: Na improved to  128 He did receive IV fluids, continue fluid restriction Continue Sodium chloride tablet 1 g twice daily Trend sodium.   UTI: UA abnormal. WBC improved. Continue Rocephin. Follow urine cultures and deescalate antibiotic accordingly.   Essential (primary) hypertension IV hydralazine as needed Home dose Verapamil ordered Hold Lasix due to hyponatremia   Hyperlipidemia Continue Lipitor   Peripheral artery disease (HCC):  On aspirin, Lipitor Needs to follow-up with vascular surgeon outpatient.   Stage 3b chronic kidney disease (CKD) (HCC):  Stable renal function.   Follow-up with BMP. Avoid nephrotoxic drugs.   Mild dementia without behavioral disturbance, psychotic disturbance, mood disturbance, or anxiety (HCC) Fall precaution   Obesity (BMI 30-39.9): WBC while 8.9 kg, BMI 34.44 Diet, exercise and weight reduction advised.   BPH: He has  incontinence, decubitus ulcer Continue Flomax and oxybutynin. Pressure injury skin care.       Out of bed to chair. Incentive spirometry. Nursing supportive care. Fall, aspiration precautions. DVT prophylaxis   Code Status: Limited: Do not attempt resuscitation (DNR) -DNR-LIMITED -Do Not Intubate/DNI   Subjective: Patient is seen and examined today morning. He is working with OT. Son at bedside who states he is at baseline mental status. Eating fair.  Physical Exam: Vitals:   10/19/22 2055 10/20/22 0010 10/20/22 0447 10/20/22 0759  BP: 139/60 (!) 140/65 (!) 157/66 130/70  Pulse: 64 65 71 73  Resp: 18 20 18    Temp: 99.1 F (37.3 C) (!) 97.1 F (36.2 C) (!) 97.4 F (36.3 C) 97.8 F (36.6 C)  TempSrc:    Oral  SpO2: 100% 99% 97% 97%  Weight:      Height:        General - Elderly Caucasian male, no apparent no apparent distress HEENT - PERRLA, EOMI, atraumatic head, non tender sinuses. Lung - Clear, bibasal rales, no rhonchi, wheezes. Heart - S1, S2 heard, no murmurs, rubs, trace pedal edema. Abdomen - Soft, non tender, obese, bowel sounds good Neuro - Alert, awake and oriented, non focal exam. Skin - Warm and dry.  Data Reviewed:      Latest Ref Rng & Units 10/20/2022    5:22 AM 10/19/2022    2:33 AM 10/18/2022   10:57 PM  CBC  WBC 4.0 - 10.5 K/uL 9.1  16.4  16.8   Hemoglobin 13.0 - 17.0 g/dL 27.2  53.6  64.4   Hematocrit 39.0 - 52.0 % 32.6  37.4  35.7   Platelets 150 - 400 K/uL 216  236  231       Latest Ref Rng & Units 10/20/2022    5:22 AM 10/20/2022    1:47 AM 10/19/2022    6:27 PM  BMP  Glucose 70 - 99 mg/dL 78  80  875   BUN 8 - 23 mg/dL 22  23  24    Creatinine 0.61 - 1.24 mg/dL 6.43  3.29  5.18   Sodium 135 - 145 mmol/L 128  126  122   Potassium 3.5 - 5.1 mmol/L 4.0  4.5  4.9   Chloride 98 - 111 mmol/L 98  94  89   CO2 22 - 32 mmol/L 24  21  20    Calcium 8.9 - 10.3 mg/dL 8.6  8.4  8.5    MR BRAIN WO CONTRAST  Result Date: 10/19/2022 CLINICAL DATA:   Nausea and vomiting, left-sided weakness and aphasia EXAM: MRI HEAD WITHOUT CONTRAST TECHNIQUE: Multiplanar, multiecho pulse sequences of the brain and surrounding structures were obtained without intravenous contrast. COMPARISON:  02/15/2021 MRI head, 10/18/2022 CT head FINDINGS: Brain: No restricted diffusion to suggest acute or subacute infarct. No acute hemorrhage, mass, mass effect, or midline shift. No hydrocephalus or extra-axial collection. Normal pituitary and craniocervical junction. No hemosiderin deposition to suggest remote hemorrhage. Distal portion atrophy affecting the frontal and temporal lobes. Ex vacuo dilatation of the ventricles. Lower confluent T2 hyperintense signal in the periventricular white matter, likely the sequela of moderate chronic small vessel ischemic disease. Vascular: Normal arterial flow voids. Skull and upper cervical spine: Normal marrow signal. Sinuses/Orbits: Clear paranasal sinuses. No acute finding in the orbits. Status post bilateral lens replacements. Other: The mastoid air cells are well aerated. IMPRESSION: No acute intracranial process. No evidence of acute or subacute infarct. Electronically Signed   By: Wiliam Ke M.D.   On: 10/19/2022 03:19   CT HEAD WO CONTRAST ( )  Result Date: 10/18/2022 CLINICAL DATA:  Acute stroke suspected, left-sided weakness with facial droop. EXAM: CT HEAD WITHOUT CONTRAST TECHNIQUE: Contiguous axial images were obtained from the base of the skull through the vertex without intravenous contrast. RADIATION DOSE REDUCTION: This exam was performed according to the departmental dose-optimization program which includes automated exposure control, adjustment of the mA and/or kV according to patient size and/or use of iterative reconstruction technique. COMPARISON:  MRI head 02/15/2021.  CT head 12/16/2020. FINDINGS: Brain: No evidence of acute infarction, hemorrhage, hydrocephalus, extra-axial collection or mass lesion/mass effect.  Again seen is moderate diffuse atrophy and mild periventricular white matter hypodensity, likely chronic small vessel ischemic change. Vascular: Atherosclerotic calcifications are present within the cavernous internal carotid arteries. Skull: Normal. Negative for fracture or focal lesion. Sinuses/Orbits: No acute finding. Other: None. IMPRESSION: 1. No acute intracranial abnormality. 2. Moderate diffuse atrophy and mild chronic small vessel ischemic change. Electronically Signed   By: Darliss Cheney M.D.   On: 10/18/2022 23:52   DG Chest Portable 1 View  Result Date: 10/18/2022 CLINICAL DATA:  Altered mental status EXAM: PORTABLE CHEST 1 VIEW COMPARISON:  Chest x-ray 12/16/2020 FINDINGS: The heart size and mediastinal contours are within normal limits. Both lungs are clear. The visualized skeletal structures are unremarkable. IMPRESSION: No active disease. Electronically Signed   By: Darliss Cheney M.D.   On: 10/18/2022 23:38     Family Communication: Discussed with patient son at bedside, understands and agrees. All questions answereed.    Disposition: Status is: Inpatient Remains inpatient appropriate because: Low sodium, UTI  Planned Discharge Destination: Home with Home Health  Time spent: 38 minutes  Author: Marcelino Duster, MD 10/20/2022 11:37 AM Secure chat 7am to 7pm For on call review [email or web address].

## 2022-10-20 NOTE — Evaluation (Signed)
Physical Therapy Evaluation Patient Details Name: Kyle Spence MRN: [id number] DOB: [birth year] Today's Date: 10/20/2022  History of Present Illness  Patient is an 89 y.o. male with medical history significant of hypertension, hyperlipidemia, PVD, gout, mild dementia, CKD stage IIIb, BPH, who presents with altered mental status, nausea, vomiting, diarrhea. Current MD assessment includes: Acute metabolic encephalopathy, Nausea vomiting and diarrhea, hyponatremia, and leukocytosis.  Clinical Impression  Pt was pleasant and motivated to participate during the session and put forth good effort throughout. Pt found supine in bed with son in room. Pt is able to sit to EOB with HOB elevated, use of bed rails, and increased time to fully adjust. Pt able to perform STS transfers throughout session from varying surfaces and heights with RW and Min A , providing cues for wide BOS, hand placement,proper sequencing and forwards lean. Provided additional cues for slowed eccentric control with sitting down as well. Pt able to perform x3 ambulatory bouts in room with RW and CGA/Min A and chair follow, with seated break between each bout. Cues provided for wide BOS when walking, and upright posture; though pt's son reports pt gait is close to baseline. Pt will benefit from continued PT services upon discharge to safely address deficits listed in patient problem list for decreased caregiver assistance and eventual return to PLOF.          If plan is discharge home, recommend the following: A little help with walking and/or transfers;Assistance with cooking/housework;A little help with bathing/dressing/bathroom;Direct supervision/assist for medications management;Assist for transportation;Help with stairs or ramp for entrance   Can travel by private vehicle        Equipment Recommendations None recommended by PT  Recommendations for Other Services       Functional Status Assessment Patient has had a recent  decline in their functional status and demonstrates the ability to make significant improvements in function in a reasonable and predictable amount of time.     Precautions / Restrictions Precautions Precautions: Fall Restrictions Weight Bearing Restrictions: No      Mobility  Bed Mobility Overal bed mobility: Needs Assistance Bed Mobility: Supine to Sit     Supine to sit: Supervision, HOB elevated, Used rails     General bed mobility comments: cues for bed rail use, pt able to sit up with increased time, bed rails and HOB elevated; uses bed rail and rope handle at baseline per son    Transfers Overall transfer level: Needs assistance Equipment used: Rolling walker (2 wheels) Transfers: Sit to/from Stand Sit to Stand: Min assist           General transfer comment: Pt performed multiple STS's during session from variable heights and surfaces with Min A for each; providing cues for proper hand placement and sequencing.    Ambulation/Gait Ambulation/Gait assistance: Min assist, Contact guard assist Gait Distance (Feet): 7 Feet x3 Assistive device: Rolling walker (2 wheels) Gait Pattern/deviations: Trunk flexed, Decreased step length - left, Decreased step length - right, Decreased stride length, Step-through pattern Gait velocity: decreased     General Gait Details: Pt ablt to complete 3x bouts of forwards walking with chair follow, with RW providing CGA/Min A. Pt walking with short steps and forwards flexed posture; son reports pt's gait is close to baseline.  Stairs            Wheelchair Mobility     Tilt Bed    Modified Rankin (Stroke Patients Only)       Balance Overall balance assessment:  Needs assistance Sitting-balance support: Single extremity supported, Feet supported Sitting balance-Leahy Scale: Fair Sitting balance - Comments: seated EOB with BUE support   Standing balance support: Bilateral upper extremity supported, During functional  activity, Reliant on assistive device for balance Standing balance-Leahy Scale: Poor Standing balance comment: static standing with RW, cues for improved stance with wide BOS and upright posture.                             Pertinent Vitals/Pain Pain Assessment Pain Assessment: Faces Faces Pain Scale: No hurt    Home Living Family/patient expects to be discharged to:: Private residence Living Arrangements: Alone Available Help at Discharge: Family (Son there during the day typically 7a-6p and works from [employer] home, pt in bed once son leaves until he comes the next morning.) Type of Home: House Home Access: Ramped entrance       Home Layout: One level Home Equipment: Agricultural consultant (2 wheels);Rollator (4 wheels);Cane - single point;Shower seat;Toilet riser;Grab bars - toilet;Grab bars - tub/shower;Wheelchair - manual;Lift chair;Hospital bed;Other (comment) Additional Comments: has another rollator with bilateral UE platforms    Prior Function Prior Level of Function : Needs assist  Cognitive Assist : Mobility (cognitive);ADLs (cognitive)     Physical Assist : ADLs (physical);Mobility (physical) Mobility (physical): Transfers;Gait ADLs (physical): Bathing;Dressing;Toileting;IADLs Mobility Comments: Per son, pt uses platform rollator initially in the morning to get to w/c, but throughout the day uses regular rollator with w/c follow by son, occasionally needs assist to stand but son cues him for proper technique ADLs Comments: Son assists with pericare after toileting, LB dressing, meals, supervision for UB dressing, assist for seated shower, assist for all IADL. Pt receives meals on wheels and is able to feed himself. Typically mod indep with seated grooming tasks.     Extremity/Trunk Assessment   Upper Extremity Assessment Upper Extremity Assessment: Generalized weakness    Lower Extremity Assessment Lower Extremity Assessment: Generalized weakness        Communication   Communication Communication: No apparent difficulties Cueing Techniques: Tactile cues;Verbal cues;Visual cues  Cognition Arousal: Alert Behavior During Therapy: WFL for tasks assessed/performed Overall Cognitive Status: History of cognitive impairments - at baseline                                 General Comments: able to follow one step commands with simple cues.        General Comments      Exercises     Assessment/Plan    PT Assessment Patient needs continued PT services  PT Problem List Decreased strength;Decreased coordination;Decreased range of motion;Decreased activity tolerance;Decreased balance;Decreased mobility       PT Treatment Interventions Gait training;DME instruction;Balance training;Patient/family education;Stair training;Functional mobility training;Therapeutic activities;Therapeutic exercise    PT Goals (Current goals can be found in the Care Plan section)  Acute Rehab PT Goals Patient Stated Goal: go home PT Goal Formulation: With patient/family Time For Goal Achievement: 11/02/22 Potential to Achieve Goals: Good    Frequency Min 1X/week     Co-evaluation               AM-PAC PT "6 Clicks" Mobility  Outcome Measure Help needed turning from your back to your side while in a flat bed without using bedrails?: A Little Help needed moving from lying on your back to sitting on the side of a flat bed without using  bedrails?: A Little Help needed moving to and from a bed to a chair (including a wheelchair)?: A Little Help needed standing up from a chair using your arms (e.g., wheelchair or bedside chair)?: A Little Help needed to walk in hospital room?: A Little Help needed climbing 3-5 steps with a railing? : A Lot 6 Click Score: 17    End of Session Equipment Utilized During Treatment: Gait belt Activity Tolerance: Patient tolerated treatment well Patient left: in bed;with call bell/phone within reach;with  family/visitor present Nurse Communication: Mobility status, pt need for chair alarm box  PT Visit Diagnosis: Unsteadiness on feet (R26.81);Muscle weakness (generalized) (M62.81);Difficulty in walking, not elsewhere classified (R26.2);Other abnormalities of gait and mobility (R26.89)    Time: 4098-1191 PT Time Calculation (min) (ACUTE ONLY): 26 min   Charges:                 Cecile Sheerer, SPT 10/20/22, 11:46 AM

## 2022-10-20 NOTE — Evaluation (Signed)
Occupational Therapy Evaluation Patient Details Name: Kyle Spence MRN: [id number] DOB: Oct 26, 1932 Today's Date: 10/20/2022   History of Present Illness Patient is an 89 y.o. male with medical history significant of hypertension, hyperlipidemia, PVD, gout, mild dementia, CKD stage IIIb, BPH, who presents with altered mental status, nausea, vomiting, diarrhea. Current MD assessment includes: Acute metabolic encephalopathy, Nausea vomiting and diarrhea, hyponatremia, and leukocytosis.   Clinical Impression   Pt was seen for OT evaluation this date. Prior to hospital admission, pt was living in a 1 story ramped entrance home. His son works from the pt's home during the day and provides assist for bathing, dressing, toileting, and all IADL. Son provides w/c follow when pt is using rollator vs rollator with platforms and will cue the pt in sequencing as needed. Son present for evaluation and provides great detail on routines and home setup/PLOF. Pt presents to acute OT demonstrating impaired ADL performance and functional mobility 2/2 impaired balance, baseline cognitive deficits, and decreased strength (See OT problem list for additional functional deficits). Pt currently requires MIN-MOD A for bed mobility and ADL transfers with RW requiring VC for sequencing. Pt completed repeated STS transfers with noted improvement in assist required with fair carryover in technique with cues. Able to take a couple steps forward and backward with MIN A. Pt adjusts socks seated EOB without direct assist and requires MOD A for LB dressing when ADL transfers are incorporated. Pt tolerated standing with MIN A and PRN VC for improving upright posture with BUE support on RW while NT was needed to assist for MAX A pericare after pt was incontinent of BM during standing attempts. Pt would benefit from skilled OT services to address noted impairments and functional limitations (see below for any additional details) in order to  maximize safety and independence while minimizing falls risk and caregiver burden.     If plan is discharge home, recommend the following: A little help with walking and/or transfers;A lot of help with bathing/dressing/bathroom;Assistance with cooking/housework;Assist for transportation;Help with stairs or ramp for entrance;Direct supervision/assist for medications management;Supervision due to cognitive status;Direct supervision/assist for financial management    Functional Status Assessment  Patient has had a recent decline in their functional status and demonstrates the ability to make significant improvements in function in a reasonable and predictable amount of time.  Equipment Recommendations  None recommended by OT    Recommendations for Other Services       Precautions / Restrictions Precautions Precautions: Fall Restrictions Weight Bearing Restrictions: No      Mobility Bed Mobility Overal bed mobility: Needs Assistance Bed Mobility: Supine to Sit     Supine to sit: Mod assist, Used rails, HOB elevated     General bed mobility comments: cues for sequencing (per son, typically uses bed rail and rope handle to pull himself to the side of the bed)    Transfers Overall transfer level: Needs assistance Equipment used: Rolling walker (2 wheels) Transfers: Sit to/from Stand Sit to Stand: Min assist, Mod assist, From elevated surface           General transfer comment: initial MOD A with heavy posterior lean requiring VC for correcting, improving with repeated transfers and elevated bed up ~2' and cues for hand placement to MIN A      Balance Overall balance assessment: Needs assistance Sitting-balance support: Single extremity supported, Feet supported Sitting balance-Leahy Scale: Fair Sitting balance - Comments: able to intermittently sit unassisted without UE support on EOB briefly Postural control: Posterior  lean Standing balance support: Bilateral upper  extremity supported, During functional activity, Reliant on assistive device for balance Standing balance-Leahy Scale: Poor Standing balance comment: VC to improve posture, initially with narrow BOS requiring VC to correct, posterior lean                           ADL either performed or assessed with clinical judgement   ADL Overall ADL's : Needs assistance/impaired                       Lower Body Dressing Details (indicate cue type and reason): able to adjust socks seated EOB wihtout direct assist, requires assist for LB dressing when ADL transfers involved 2/2 poor balance     Toileting- Clothing Manipulation and Hygiene: Maximal assistance;Sit to/from stand Toileting - Clothing Manipulation Details (indicate cue type and reason): CGA-MIN A to maintain static standing with PRN VC for improving upright posture while NT completed MAX A pericare     Functional mobility during ADLs: Minimal assistance;Cueing for safety;Cueing for sequencing;Rolling walker (2 wheels)       Vision         Perception         Praxis         Pertinent Vitals/Pain Pain Assessment Pain Assessment: No/denies pain     Extremity/Trunk Assessment Upper Extremity Assessment Upper Extremity Assessment: Generalized weakness   Lower Extremity Assessment Lower Extremity Assessment: Generalized weakness       Communication Communication Communication: No apparent difficulties Cueing Techniques: Verbal cues;Visual cues   Cognition Arousal: Alert Behavior During Therapy: WFL for tasks assessed/performed Overall Cognitive Status: History of cognitive impairments - at baseline                                 General Comments: Pt oriented to self, place and month (looks to whiteboard on wall for visual cue), and situation (son reports he typically is oriented to the year as well). Follows commands with simple cues.     General Comments       Exercises Other  Exercises Other Exercises: Pt/son educated in AE/DME, home/routines modifications, ADL transfer training to improve balance/stability, role of OT in acute care setting, and recommendations   Shoulder Instructions      Home Living Family/patient expects to be discharged to:: Private residence Living Arrangements: Alone Available Help at Discharge: Family (Son there during the day typically 7a-6p and works from [employer] home, pt in bed once son leaves until he comes the next morning.) Type of Home: House Home Access: Ramped entrance     Home Layout: One level     Bathroom Shower/Tub: Walk-in Soil scientist Toilet: Handicapped height     Home Equipment: Agricultural consultant (2 wheels);Rollator (4 wheels);Cane - single point;Shower seat;Toilet riser;Grab bars - toilet;Grab bars - tub/shower;Wheelchair - manual;Lift chair;Hospital bed;Other (comment)   Additional Comments: has another rollator with bilateral UE platforms      Prior Functioning/Environment Prior Level of Function : Needs assist  Cognitive Assist : Mobility (cognitive);ADLs (cognitive)     Physical Assist : ADLs (physical);Mobility (physical) Mobility (physical): Transfers;Gait ADLs (physical): Bathing;Dressing;Toileting;IADLs Mobility Comments: Per son, pt uses platform rollator initially in the morning to get to w/c, but throughout the day uses regular rollator with w/c follow by son, occasionally needs assist to stand but son cues him for proper technique ADLs  Comments: Son assists with pericare after toileting, LB dressing, meals, supervision for UB dressing, assist for seated shower, assist for all IADL. Pt receives meals on wheels and is able to feed himself. Typically mod indep with seated grooming tasks.        OT Problem List: Decreased strength;Decreased cognition;Decreased safety awareness;Impaired balance (sitting and/or standing);Decreased knowledge of use of DME or AE      OT Treatment/Interventions:  Self-care/ADL training;Therapeutic exercise;Therapeutic activities;Cognitive remediation/compensation;Balance training;Patient/family education;DME and/or AE instruction    OT Goals(Current goals can be found in the care plan section) Acute Rehab OT Goals Patient Stated Goal: son wants pt to go home OT Goal Formulation: With patient/family Time For Goal Achievement: 11/03/22 Potential to Achieve Goals: Good ADL Goals Pt Will Perform Grooming: with supervision;sitting Pt Will Perform Lower Body Dressing: with min assist;with caregiver independent in assisting;sit to/from stand Pt Will Transfer to Toilet: with contact guard assist;ambulating (LRAD)  OT Frequency: Min 1X/week    Co-evaluation              AM-PAC OT "6 Clicks" Daily Activity     Outcome Measure Help from another person eating meals?: None Help from another person taking care of personal grooming?: A Little Help from another person toileting, which includes using toliet, bedpan, or urinal?: A Lot Help from another person bathing (including washing, rinsing, drying)?: A Lot Help from another person to put on and taking off regular upper body clothing?: A Little Help from another person to put on and taking off regular lower body clothing?: A Lot 6 Click Score: 16   End of Session Equipment Utilized During Treatment: Gait belt;Rolling walker (2 wheels)  Activity Tolerance: Patient tolerated treatment well Patient left: in bed;with call bell/phone within reach;with bed alarm set;with family/visitor present  OT Visit Diagnosis: Other abnormalities of gait and mobility (R26.89);Repeated falls (R29.6)                Time: 1610-9604 OT Time Calculation (min): 53 min Charges:  OT General Charges $OT Visit: 1 Visit OT Evaluation $OT Eval Moderate Complexity: 1 Mod OT Treatments $Self Care/Home Management : 8-22 mins $Therapeutic Activity: 8-22 mins  Arman Filter., MPH, MS, OTR/L ascom [phone] 10/20/22, 11:20 AM

## 2022-10-20 NOTE — Progress Notes (Signed)
Pt unable to stand to get complete set of orthostatic vitals.

## 2022-10-21 DIAGNOSIS — D72829 Elevated white blood cell count, unspecified: Secondary | ICD-10-CM | POA: Diagnosis not present

## 2022-10-21 DIAGNOSIS — G9341 Metabolic encephalopathy: Secondary | ICD-10-CM | POA: Diagnosis not present

## 2022-10-21 DIAGNOSIS — N401 Enlarged prostate with lower urinary tract symptoms: Secondary | ICD-10-CM

## 2022-10-21 DIAGNOSIS — F02A0 Dementia in other diseases classified elsewhere, mild, without behavioral disturbance, psychotic disturbance, mood disturbance, and anxiety: Secondary | ICD-10-CM

## 2022-10-21 DIAGNOSIS — G308 Other Alzheimer's disease: Secondary | ICD-10-CM

## 2022-10-21 DIAGNOSIS — R112 Nausea with vomiting, unspecified: Secondary | ICD-10-CM | POA: Diagnosis not present

## 2022-10-21 DIAGNOSIS — E871 Hypo-osmolality and hyponatremia: Secondary | ICD-10-CM | POA: Diagnosis not present

## 2022-10-21 DIAGNOSIS — N138 Other obstructive and reflux uropathy: Secondary | ICD-10-CM

## 2022-10-21 LAB — BASIC METABOLIC PANEL
Anion gap: 8 (ref 5–15)
BUN: 28 mg/dL — ABNORMAL HIGH (ref 8–23)
CO2: 24 mmol/L (ref 22–32)
Calcium: 8.7 mg/dL — ABNORMAL LOW (ref 8.9–10.3)
Chloride: 103 mmol/L (ref 98–111)
Creatinine, Ser: 1.23 mg/dL (ref 0.61–1.24)
GFR, Estimated: 56 mL/min — ABNORMAL LOW (ref >60)
Glucose, Bld: 83 mg/dL (ref 70–99)
Potassium: 3.7 mmol/L (ref 3.5–5.1)
Sodium: 134 mmol/L — ABNORMAL LOW (ref 135–145)

## 2022-10-21 NOTE — Progress Notes (Signed)
Physical Therapy Treatment Patient Details Name: Kyle Spence MRN: [id number] DOB: October 19, 1932 Today's Date: 10/21/2022   History of Present Illness Patient is an 89 y.o. male with medical history significant of hypertension, hyperlipidemia, PVD, gout, mild dementia, CKD stage IIIb, BPH, who presents with altered mental status, nausea, vomiting, diarrhea. Current MD assessment includes: Acute metabolic encephalopathy, Nausea vomiting and diarrhea, hyponatremia, and leukocytosis.    PT Comments  Son in room.  State pt has had some increased confusion and agitation starting last night and it does carry over this am.  MD and son attribute it to hospital stay delirium/dementia.  He is assisted to EOB with mod a x 2 today with post lean and resistance.  Increased time given to get pt to sit upright and while he does need less assist with time he is unable to sit unassisted.  Stood x 2 with RW with mod a x 2 from elevated bed.  He has excessive post lean with back of legs supported on bed and is unable to take any function steps today.  Encouraged to transition to chair but agitation increases and he is eventually returned to supine with heavy assist.  Discussed discharge plan.  Son is very involved in his care at home and would like to take pt home.  They have most equipment already at home but are interested in mechanical lift  for home.  He often falls at home and a lift would be helpful and may be necessary initially if fear and cognition prevent safe mobility at home.  TOC is aware and arranging delivery.   If plan is discharge home, recommend the following: A little help with walking and/or transfers;Assistance with cooking/housework;A little help with bathing/dressing/bathroom;Direct supervision/assist for medications management;Assist for transportation;Help with stairs or ramp for entrance   Can travel by private vehicle        Equipment Recommendations  Hoyer lift    Recommendations for  Other Services       Precautions / Restrictions Precautions Precautions: Fall Restrictions Weight Bearing Restrictions: No     Mobility  Bed Mobility Overal bed mobility: Needs Assistance Bed Mobility: Supine to Sit, Sit to Supine     Supine to sit: Mod assist, +2 for physical assistance Sit to supine: Mod assist, +2 for physical assistance     Patient Response: Anxious, Restless  Transfers Overall transfer level: Needs assistance Equipment used: Rolling walker (2 wheels) Transfers: Sit to/from Stand Sit to Stand: Mod assist, +2 physical assistance                Ambulation/Gait Ambulation/Gait assistance: Mod assist, +2 physical assistance Gait Distance (Feet): 1 Feet Assistive device: Rolling walker (2 wheels) Gait Pattern/deviations: Trunk flexed, Decreased step length - left, Decreased step length - right, Decreased stride length, Step-through pattern, Leaning posteriorly Gait velocity: decreased     General Gait Details: takes one small non function step along bed with heavy +2 assist and post lean against bed   Stairs             Wheelchair Mobility     Tilt Bed Tilt Bed Patient Response: Anxious, Restless  Modified Rankin (Stroke Patients Only)       Balance Overall balance assessment: Needs assistance Sitting-balance support: Feet supported Sitting balance-Leahy Scale: Poor Sitting balance - Comments: post lean resisting sitting upright today   Standing balance support: Bilateral upper extremity supported, During functional activity, Reliant on assistive device for balance Standing balance-Leahy Scale: Poor Standing balance comment: +  2 heavy assist fearful of falling                            Cognition Arousal: Alert Behavior During Therapy: Agitated Overall Cognitive Status: Impaired/Different from baseline                                 General Comments: increased agitation and confusion today.   son/MD in room and attribute it to hospital stay and dementia        Exercises      General Comments        Pertinent Vitals/Pain Pain Assessment Pain Assessment: No/denies pain    Home Living                          Prior Function            PT Goals (current goals can now be found in the care plan section) Progress towards PT goals: Not progressing toward goals - comment    Frequency    Min 1X/week      PT Plan      Co-evaluation              AM-PAC PT "6 Clicks" Mobility   Outcome Measure  Help needed turning from your back to your side while in a flat bed without using bedrails?: A Lot Help needed moving from lying on your back to sitting on the side of a flat bed without using bedrails?: A Lot Help needed moving to and from a bed to a chair (including a wheelchair)?: A Lot Help needed standing up from a chair using your arms (e.g., wheelchair or bedside chair)?: A Lot Help needed to walk in hospital room?: Total Help needed climbing 3-5 steps with a railing? : Total 6 Click Score: 10    End of Session Equipment Utilized During Treatment: Gait belt Activity Tolerance: Patient tolerated treatment well Patient left: in bed;with call bell/phone within reach;with family/visitor present Nurse Communication: Mobility status PT Visit Diagnosis: Unsteadiness on feet (R26.81);Muscle weakness (generalized) (M62.81);Difficulty in walking, not elsewhere classified (R26.2);Other abnormalities of gait and mobility (R26.89)     Time: 8469-6295 PT Time Calculation (min) (ACUTE ONLY): 13 min  Charges:    $Therapeutic Activity: 8-22 mins PT General Charges $$ ACUTE PT VISIT: 1 Visit                   Danielle Dess, PTA 10/21/22, 11:40 AM

## 2022-10-21 NOTE — Discharge Summary (Signed)
Physician Discharge Summary   Patient: Kyle Spence MRN: [id number] DOB: 13-Jun-1932  Admit date:     10/18/2022  Discharge date: 10/21/22  Discharge Physician: Marcelino Duster   PCP: Marisue Ivan, MD   Recommendations at discharge:  {Tip this will not be part of the note when signed- Example include specific recommendations for outpatient follow-up, pending tests to follow-up on. (Optional):26781}  PCP follow up in 1 week.  Discharge Diagnoses: Principal Problem:   Acute metabolic encephalopathy Active Problems:   Nausea vomiting and diarrhea   Hyponatremia   Leukocytosis   Essential (primary) hypertension   Hyperlipidemia   Peripheral artery disease (HCC)   Stage 3b chronic kidney disease (CKD) (HCC)   Mild dementia without behavioral disturbance, psychotic disturbance, mood disturbance, or anxiety (HCC)   Gout   Obesity (BMI 30-39.9)   BPH (benign prostatic hyperplasia)  Resolved Problems:   * No resolved hospital problems. [facility] Course: No notes on file  Assessment and Plan: No notes have been filed under this hospital service. Service: Hospitalist     {Tip this will not be part of the note when signed Body mass index is 29.07 kg/m. , ,  (Optional):26781}  {(NOTE) Pain control PDMP Statment (Optional):26782} Consultants: none Procedures performed: none  Disposition: Home health Diet recommendation:  Discharge Diet Orders (From admission, onward)     Start     Ordered   10/21/22 0000  Diet - low sodium heart healthy       Comments: Sift diet, dysphagia 2   10/21/22 1122           Cardiac diet DISCHARGE MEDICATION: Allergies as of 10/21/2022       Reactions   Enalapril Other (See Comments)   HYPERKALEMIA    Penicillins Swelling        Medication List     STOP taking these medications    enalapril 20 MG tablet Commonly known as: VASOTEC   enoxaparin 30 MG/0.3ML injection Commonly known as: LOVENOX   ferrous  sulfate 325 (65 FE) MG tablet   HYDROcodone-acetaminophen 5-325 MG tablet Commonly known as: NORCO/VICODIN   mirabegron ER 25 MG Tb24 tablet Commonly known as: MYRBETRIQ       TAKE these medications    acetaminophen 325 MG tablet Commonly known as: TYLENOL Take 1-2 tablets (325-650 mg total) by mouth every 6 (six) hours as needed for mild pain (pain score 1-3 or temp > 100.5).   allopurinol 100 MG tablet Commonly known as: ZYLOPRIM Take 100 mg by mouth daily.   aspirin EC 81 MG tablet Take 81 mg by mouth daily.   atorvastatin 40 MG tablet Commonly known as: LIPITOR Take 40 mg by mouth daily.   docusate sodium 50 MG capsule Commonly known as: COLACE Take by mouth.   furosemide 20 MG tablet Commonly known as: LASIX Take 1 tablet (20 mg total) by mouth daily.   glucosamine-chondroitin 500-400 MG tablet Take 1 tablet by mouth 3 (three) times daily.   Glycerin-Hypromellose-PEG 400 0.2-0.2-1 % Soln Apply to eye.   Multi-Vitamins Tabs Take 1 tablet by mouth daily.   oxybutynin 5 MG 24 hr tablet Commonly known as: DITROPAN-XL Take 5 mg by mouth daily.   Prostate 2.4 Caps Take by mouth.   tamsulosin 0.4 MG Caps capsule Commonly known as: FLOMAX Take 0.4 mg by mouth daily.   verapamil 240 MG 24 hr capsule Commonly known as: VERELAN Take 1 capsule (240 mg total) by mouth daily.  Durable Medical Equipment  (From admission, onward)           Start     Ordered   10/21/22 1117  For home use only DME Other see comment  Once       Comments: Michiel Sites lift  Question:  Length of Need  Answer:  Lifetime   10/21/22 1117            Follow-up Information     Marisue Ivan, MD Follow up.   Specialty: Family Medicine Contact information: [address] [facility] [place] [address] [phone]                Discharge Exam: Ceasar Mons Weights   10/18/22 2315 10/19/22 0909  Weight: 108.9 kg 91.9 kg    ***  Condition at discharge: stable  The results of significant diagnostics from this hospitalization (including imaging, microbiology, ancillary and laboratory) are listed below for reference.   Imaging Studies: MR BRAIN WO CONTRAST  Result Date: 10/19/2022 CLINICAL DATA:  Nausea and vomiting, left-sided weakness and aphasia EXAM: MRI HEAD WITHOUT CONTRAST TECHNIQUE: Multiplanar, multiecho pulse sequences of the brain and surrounding structures were obtained without intravenous contrast. COMPARISON:  02/15/2021 MRI head, 10/18/2022 CT head FINDINGS: Brain: No restricted diffusion to suggest acute or subacute infarct. No acute hemorrhage, mass, mass effect, or midline shift. No hydrocephalus or extra-axial collection. Normal pituitary and craniocervical junction. No hemosiderin deposition to suggest remote hemorrhage. Distal portion atrophy affecting the frontal and temporal lobes. Ex vacuo dilatation of the ventricles. Lower confluent T2 hyperintense signal in the periventricular white matter, likely the sequela of moderate chronic small vessel ischemic disease. Vascular: Normal arterial flow voids. Skull and upper cervical spine: Normal marrow signal. Sinuses/Orbits: Clear paranasal sinuses. No acute finding in the orbits. Status post bilateral lens replacements. Other: The mastoid air cells are well aerated. IMPRESSION: No acute intracranial process. No evidence of acute or subacute infarct. Electronically Signed   By: Wiliam Ke M.D.   On: 10/19/2022 03:19   CT HEAD WO CONTRAST ( )  Result Date: 10/18/2022 CLINICAL DATA:  Acute stroke suspected, left-sided weakness with facial droop. EXAM: CT HEAD WITHOUT CONTRAST TECHNIQUE: Contiguous axial images were obtained from the base of the skull through the vertex without intravenous contrast. RADIATION DOSE REDUCTION: This exam was performed according to the departmental dose-optimization program which includes automated exposure control,  adjustment of the mA and/or kV according to patient size and/or use of iterative reconstruction technique. COMPARISON:  MRI head 02/15/2021.  CT head 12/16/2020. FINDINGS: Brain: No evidence of acute infarction, hemorrhage, hydrocephalus, extra-axial collection or mass lesion/mass effect. Again seen is moderate diffuse atrophy and mild periventricular white matter hypodensity, likely chronic small vessel ischemic change. Vascular: Atherosclerotic calcifications are present within the cavernous internal carotid arteries. Skull: Normal. Negative for fracture or focal lesion. Sinuses/Orbits: No acute finding. Other: None. IMPRESSION: 1. No acute intracranial abnormality. 2. Moderate diffuse atrophy and mild chronic small vessel ischemic change. Electronically Signed   By: Darliss Cheney M.D.   On: 10/18/2022 23:52   DG Chest Portable 1 View  Result Date: 10/18/2022 CLINICAL DATA:  Altered mental status EXAM: PORTABLE CHEST 1 VIEW COMPARISON:  Chest x-ray 12/16/2020 FINDINGS: The heart size and mediastinal contours are within normal limits. Both lungs are clear. The visualized skeletal structures are unremarkable. IMPRESSION: No active disease. Electronically Signed   By: Darliss Cheney M.D.   On: 10/18/2022 23:38    Microbiology: Results for orders placed or performed  during the hospital encounter of 10/18/22  Culture, blood (Routine X 2) w Reflex to ID Panel     Status: None (Preliminary result)   Collection Time: 10/19/22  2:33 AM   Specimen: BLOOD  Result Value Ref Range Status   Specimen Description BLOOD BLOOD RIGHT ARM  Final   Special Requests   Final    BOTTLES DRAWN AEROBIC AND ANAEROBIC Blood Culture results may not be optimal due to an excessive volume of blood received in culture bottles   Culture   Final    NO GROWTH 2 DAYS Performed at [name], [address], [place], [place] [address]    Report Status PENDING  Incomplete  Culture, blood (Routine X 2) w Reflex to ID Panel      Status: None (Preliminary result)   Collection Time: 10/19/22  2:33 AM   Specimen: BLOOD  Result Value Ref Range Status   Specimen Description BLOOD BLOOD LEFT ARM  Final   Special Requests   Final    BOTTLES DRAWN AEROBIC AND ANAEROBIC Blood Culture results may not be optimal due to an excessive volume of blood received in culture bottles   Culture   Final    NO GROWTH 2 DAYS Performed at [facility], [address] [place], [place] [address]    Report Status PENDING  Incomplete  Urine Culture     Status: Abnormal   Collection Time: 10/19/22 11:00 AM   Specimen: Urine, Random  Result Value Ref Range Status   Specimen Description   Final    URINE, RANDOM Performed at [facility], [address], [place], [place] [address]    Special Requests   Final    NONE Reflexed from [id number] Performed at [facility], [address] [place], [place] [address]    Culture MULTIPLE SPECIES PRESENT, SUGGEST RECOLLECTION (A)  Final   Report Status 10/20/2022 FINAL  Final  C Difficile Quick Screen w PCR reflex     Status: None   Collection Time: 10/20/22  4:56 AM   Specimen: STOOL  Result Value Ref Range Status   C Diff antigen NEGATIVE NEGATIVE Final   C Diff toxin NEGATIVE NEGATIVE Final   C Diff interpretation No C. difficile detected.  Final    Comment: Performed at [facility], [address] [place], [place] [address]    Labs: CBC: Recent Labs  Lab 10/18/22 2257 10/19/22 0233 10/20/22 0522  WBC 16.8* 16.4* 9.1  NEUTROABS 14.8*  --   --   HGB 12.3* 13.0 11.6*  HCT 35.7* 37.4* 32.6*  MCV 93.9 92.8 92.4  PLT 231 236 216   Basic Metabolic Panel: Recent Labs  Lab 10/19/22 1015 10/19/22 1827 10/20/22 0147 10/20/22 0522 10/21/22 0648  NA 123* 122* 126* 128* 134*  K 4.8 4.9 4.5 4.0 3.7  CL 90* 89* 94* 98 103  CO2 23 20* 21* 24 24  GLUCOSE 91 152* 80 78 83  BUN 19 24* 23 22 28*  CREATININE 1.07 1.30* 1.21 1.16 1.23  CALCIUM  8.9 8.5* 8.4* 8.6* 8.7*   Liver Function Tests: Recent Labs  Lab 10/18/22 2257  AST 24  ALT 20  ALKPHOS 100  BILITOT 0.9  PROT 6.8  ALBUMIN 4.2   CBG: Recent Labs  Lab 10/18/22 2255 10/19/22 0220  GLUCAP 135* 121*    Discharge time spent: 39 minutes.  Signed: Marcelino Duster, MD [facility] 10/21/2022

## 2022-10-21 NOTE — TOC Transition Note (Signed)
Transition of Care [facility]) - CM/SW Discharge Note   Patient Details  Name: Kyle Spence MRN: [id number] Date of Birth: 01/23/1933  [facility]) CM/SW Contact:  Allena Katz, LCSW Phone Number: 10/21/2022, 11:32 AM   Clinical Narrative:   Pt discharging today with [facility]. Son to transport home. [employer] to deliver hoyer lift, jon notified. CSW signing off.     Final next level of care: Home w Home Health Services Barriers to Discharge: Barriers Resolved   Patient Goals and CMS Choice CMS Medicare.gov Compare Post Acute Care list provided to:: Patient    Discharge Placement                  Patient to be transferred to facility by: son Name of family member notified: son    Discharge Plan and Services Additional resources added to the After Visit Summary for                  DME Arranged:  (hoyer lift) DME Agency: [employer] Date DME Agency Contacted: 10/21/22   Representative spoke with at DME Agency: [facility] Arranged: PT, OT       Representative spoke with at [facility] Agency: [facility]  Social Determinants of Health (SDOH) Interventions SDOH Screenings   Food Insecurity: No Food Insecurity (10/19/2022)  Housing: Patient Declined (10/19/2022)  Transportation Needs: No Transportation Needs (10/19/2022)  Utilities: Not At Risk (10/19/2022)  Tobacco Use: Low Risk  (10/19/2022)     Readmission Risk Interventions    10/21/2022   10:31 AM  Readmission Risk Prevention Plan  Transportation Screening Complete  PCP or Specialist Appt within 5-7 Days Complete  Home Care Screening Complete

## 2022-10-21 NOTE — TOC Initial Note (Addendum)
Transition of Care [facility]) - Initial/Assessment Note    Patient Details  Name: Kyle Spence MRN: [id number] Date of Birth: 24-Nov-1932  Transition of Care [facility]) CM/SW Contact:    Allena Katz, LCSW Phone Number: 10/21/2022, 10:32 AM  Clinical Narrative:    CSW spoke with son who would like to take pt home. Son reports he stays with patient and is there mostly all day during the day. Son utilizes an aide that stays with pt when he is unable. Son not connected with them currently but uses [facility] when additional help is needed but reports he hasn't used them since May. Pt has two wheelchairs, an adjustable bed, a rollator, grab bars, a ramp, a gait belt and has cameras at home for patient. Son reports he typically can help get him in and out of a vehicle for appts and will pick him up when he discharges. Pt active with Dr. Burnadette Pop for primary care. Son would like a hoyer lift ordered as well as home health. Son reports no preference for agency referral accepted by Crist Infante with [facility] for PT/OT. Jon with [employer] contacted to bring hoyer lift.               Expected Discharge Plan: Home w Home Health Services Barriers to Discharge: Continued Medical Work up   Patient Goals and CMS Choice Patient states their goals for this hospitalization and ongoing recovery are:: return home CMS Medicare.gov Compare Post Acute Care list provided to:: Patient        Expected Discharge Plan and Services       Living arrangements for the past 2 months: Single Family Home                 DME Arranged:  (hoyer lift) DME Agency: [facility] Date DME Agency Contacted: 10/21/22   Representative spoke with at DME Agency: [facility] Arranged: PT, OT          Prior Living Arrangements/Services Living arrangements for the past 2 months: Single Family Home Lives with:: Adult Children Patient language and need for interpreter reviewed:: Yes Do you feel safe going back to the place  where you live?: Yes      Need for Family Participation in Patient Care: Yes (Comment) Care giver support system in place?: Yes (comment) Current home services: DME Criminal Activity/Legal Involvement Pertinent to Current Situation/Hospitalization: Yes - Comment as needed  Activities of Daily Living Home Assistive Devices/Equipment: Walker (specify type) ADL Screening (condition at time of admission) Is the patient deaf or have difficulty hearing?: Yes Does the patient have difficulty seeing, even when wearing glasses/contacts?: No Does the patient have difficulty concentrating, remembering, or making decisions?: Yes  Permission Sought/Granted                  Emotional Assessment       Orientation: : Fluctuating Orientation (Suspected and/or reported Sundowners)      Admission diagnosis:  Hyponatremia [E87.1] Acute metabolic encephalopathy [G93.41] Patient Active Problem List   Diagnosis Date Noted   Hyponatremia 10/19/2022   Acute metabolic encephalopathy 10/19/2022   Gout 10/19/2022   Nausea vomiting and diarrhea 10/19/2022   Obesity (BMI 30-39.9) 10/19/2022   BPH (benign prostatic hyperplasia) 10/19/2022   Acute cystitis without hematuria    Mild dementia without behavioral disturbance, psychotic disturbance, mood disturbance, or anxiety (HCC)    Hip fracture (HCC) 12/17/2020   Pre-op evaluation    Impaired fasting glucose    Fracture of  femoral neck, left (HCC) 12/16/2020   Stage 3b chronic kidney disease (CKD) (HCC) 12/16/2020   Leukocytosis 12/16/2020   Peripheral artery disease (HCC) 05/13/2016   Swelling of limb 03/11/2016   Pain in limb 03/11/2016   Borderline diabetes mellitus 02/07/2015   Chronic constipation 02/07/2015   Chronic renal failure 02/07/2015   Essential (primary) hypertension 02/07/2015   Edema leg 02/07/2015   Hyperlipidemia 02/07/2015   Adiposity 02/07/2015   Arthritis, degenerative 02/07/2015   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 [place] [place] [address] Phone: [phone] Fax: [phone]  [facility] - [place], [place] - [address] [place] [address] Phone: [phone] Fax: [phone]     Social Determinants of Health (SDOH) Social History: SDOH Screenings   Food Insecurity: No Food Insecurity (10/19/2022)  Housing: Patient Declined (10/19/2022)  Transportation Needs: No Transportation Needs (10/19/2022)  Utilities: Not At Risk (10/19/2022)  Tobacco Use: Low Risk  (10/19/2022)   SDOH Interventions:     Readmission Risk Interventions    10/21/2022   10:31 AM  Readmission Risk Prevention Plan  Transportation Screening Complete  PCP or Specialist Appt within 5-7 Days Complete  Home Care Screening Complete

## 2022-10-21 NOTE — TOC Progression Note (Signed)
Transition of Care [facility]) - Progression Note    Patient Details  Name: Kyle Spence MRN: [id number] Date of Birth: Mar 30, 1932  Transition of Care [facility]) CM/SW Contact  Allena Katz, LCSW Phone Number: 10/21/2022, 10:00 AM  Clinical Narrative:   Pt disoriented. Per MD son wants to bring patient home with [facility]. CSW LVM with son to discuss.         Expected Discharge Plan and Services                                               Social Determinants of Health (SDOH) Interventions SDOH Screenings   Food Insecurity: No Food Insecurity (10/19/2022)  Housing: Patient Declined (10/19/2022)  Transportation Needs: No Transportation Needs (10/19/2022)  Utilities: Not At Risk (10/19/2022)  Tobacco Use: Low Risk  (10/19/2022)    Readmission Risk Interventions     No data to display

## 2022-10-23 LAB — CULTURE, BLOOD (ROUTINE X 2)
Culture: NO GROWTH
Culture: NO GROWTH

## 2023-02-18 ENCOUNTER — Emergency Department: Payer: Medicare Other

## 2023-02-18 ENCOUNTER — Inpatient Hospital Stay
Admission: EM | Admit: 2023-02-18 | Discharge: 2023-02-23 | DRG: 177 | Disposition: A | Discharge status: Home Health | Payer: Medicare Other | Attending: Internal Medicine | Admitting: Internal Medicine

## 2023-02-18 ENCOUNTER — Other Ambulatory Visit: Payer: Self-pay

## 2023-02-18 DIAGNOSIS — F0390 Unspecified dementia without behavioral disturbance: Secondary | ICD-10-CM | POA: Diagnosis present

## 2023-02-18 DIAGNOSIS — Z96642 Presence of left artificial hip joint: Secondary | ICD-10-CM | POA: Diagnosis present

## 2023-02-18 DIAGNOSIS — G629 Polyneuropathy, unspecified: Secondary | ICD-10-CM | POA: Diagnosis present

## 2023-02-18 DIAGNOSIS — N182 Chronic kidney disease, stage 2 (mild): Secondary | ICD-10-CM | POA: Diagnosis present

## 2023-02-18 DIAGNOSIS — E871 Hypo-osmolality and hyponatremia: Secondary | ICD-10-CM | POA: Diagnosis present

## 2023-02-18 DIAGNOSIS — E860 Dehydration: Secondary | ICD-10-CM | POA: Diagnosis present

## 2023-02-18 DIAGNOSIS — Y92009 Unspecified place in unspecified non-institutional (private) residence as the place of occurrence of the external cause: Secondary | ICD-10-CM

## 2023-02-18 DIAGNOSIS — M109 Gout, unspecified: Secondary | ICD-10-CM | POA: Diagnosis present

## 2023-02-18 DIAGNOSIS — I129 Hypertensive chronic kidney disease with stage 1 through stage 4 chronic kidney disease, or unspecified chronic kidney disease: Secondary | ICD-10-CM | POA: Diagnosis present

## 2023-02-18 DIAGNOSIS — R531 Weakness: Secondary | ICD-10-CM

## 2023-02-18 DIAGNOSIS — G9389 Other specified disorders of brain: Secondary | ICD-10-CM | POA: Diagnosis present

## 2023-02-18 DIAGNOSIS — S2232XA Fracture of one rib, left side, initial encounter for closed fracture: Secondary | ICD-10-CM | POA: Diagnosis present

## 2023-02-18 DIAGNOSIS — R32 Unspecified urinary incontinence: Secondary | ICD-10-CM | POA: Diagnosis present

## 2023-02-18 DIAGNOSIS — R4182 Altered mental status, unspecified: Principal | ICD-10-CM

## 2023-02-18 DIAGNOSIS — N390 Urinary tract infection, site not specified: Secondary | ICD-10-CM | POA: Insufficient documentation

## 2023-02-18 DIAGNOSIS — U071 COVID-19: Principal | ICD-10-CM | POA: Insufficient documentation

## 2023-02-18 DIAGNOSIS — Z888 Allergy status to other drugs, medicaments and biological substances status: Secondary | ICD-10-CM

## 2023-02-18 DIAGNOSIS — N400 Enlarged prostate without lower urinary tract symptoms: Secondary | ICD-10-CM | POA: Diagnosis present

## 2023-02-18 DIAGNOSIS — Z66 Do not resuscitate: Secondary | ICD-10-CM | POA: Diagnosis present

## 2023-02-18 DIAGNOSIS — Z7982 Long term (current) use of aspirin: Secondary | ICD-10-CM

## 2023-02-18 DIAGNOSIS — Z880 Allergy status to penicillin: Secondary | ICD-10-CM

## 2023-02-18 DIAGNOSIS — R7303 Prediabetes: Secondary | ICD-10-CM | POA: Diagnosis present

## 2023-02-18 DIAGNOSIS — E785 Hyperlipidemia, unspecified: Secondary | ICD-10-CM | POA: Diagnosis present

## 2023-02-18 DIAGNOSIS — W19XXXA Unspecified fall, initial encounter: Secondary | ICD-10-CM | POA: Diagnosis present

## 2023-02-18 DIAGNOSIS — G9340 Encephalopathy, unspecified: Secondary | ICD-10-CM | POA: Diagnosis present

## 2023-02-18 DIAGNOSIS — Z79899 Other long term (current) drug therapy: Secondary | ICD-10-CM

## 2023-02-18 DIAGNOSIS — Z87440 Personal history of urinary (tract) infections: Secondary | ICD-10-CM

## 2023-02-18 DIAGNOSIS — G9341 Metabolic encephalopathy: Secondary | ICD-10-CM | POA: Diagnosis present

## 2023-02-18 DIAGNOSIS — G319 Degenerative disease of nervous system, unspecified: Secondary | ICD-10-CM | POA: Diagnosis present

## 2023-02-18 LAB — CBC WITH DIFFERENTIAL/PLATELET
Abs Immature Granulocytes: 0.07 10*3/uL (ref 0.00–0.07)
Basophils Absolute: 0.1 10*3/uL (ref 0.0–0.1)
Basophils Relative: 1 %
Eosinophils Absolute: 0.2 10*3/uL (ref 0.0–0.5)
Eosinophils Relative: 2 %
HCT: 40.1 % (ref 39.0–52.0)
Hemoglobin: 13.7 g/dL (ref 13.0–17.0)
Immature Granulocytes: 1 %
Lymphocytes Relative: 15 %
Lymphs Abs: 1.7 10*3/uL (ref 0.7–4.0)
MCH: 31.8 pg (ref 26.0–34.0)
MCHC: 34.2 g/dL (ref 30.0–36.0)
MCV: 93 fL (ref 80.0–100.0)
Monocytes Absolute: 0.8 10*3/uL (ref 0.1–1.0)
Monocytes Relative: 7 %
Neutro Abs: 8.3 10*3/uL — ABNORMAL HIGH (ref 1.7–7.7)
Neutrophils Relative %: 74 %
Platelets: 347 10*3/uL (ref 150–400)
RBC: 4.31 MIL/uL (ref 4.22–5.81)
RDW: 12.6 % (ref 11.5–15.5)
WBC: 11.1 10*3/uL — ABNORMAL HIGH (ref 4.0–10.5)
nRBC: 0 % (ref 0.0–0.2)

## 2023-02-18 LAB — COMPREHENSIVE METABOLIC PANEL
ALT: 50 U/L — ABNORMAL HIGH (ref 0–44)
AST: 42 U/L — ABNORMAL HIGH (ref 15–41)
Albumin: 3.2 g/dL — ABNORMAL LOW (ref 3.5–5.0)
Alkaline Phosphatase: 102 U/L (ref 38–126)
Anion gap: 10 (ref 5–15)
BUN: 28 mg/dL — ABNORMAL HIGH (ref 8–23)
CO2: 25 mmol/L (ref 22–32)
Calcium: 8.6 mg/dL — ABNORMAL LOW (ref 8.9–10.3)
Chloride: 95 mmol/L — ABNORMAL LOW (ref 98–111)
Creatinine, Ser: 0.9 mg/dL (ref 0.61–1.24)
GFR, Estimated: >60 mL/min (ref >60)
Glucose, Bld: 89 mg/dL (ref 70–99)
Potassium: 3.8 mmol/L (ref 3.5–5.1)
Sodium: 130 mmol/L — ABNORMAL LOW (ref 135–145)
Total Bilirubin: 0.8 mg/dL (ref 0.0–1.2)
Total Protein: 6.5 g/dL (ref 6.5–8.1)

## 2023-02-18 LAB — APTT: aPTT: 35 s (ref 24–36)

## 2023-02-18 LAB — LACTIC ACID, PLASMA: Lactic Acid, Venous: 0.9 mmol/L (ref 0.5–1.9)

## 2023-02-18 LAB — PROTIME-INR
INR: 1.2 (ref 0.8–1.2)
Prothrombin Time: 15.2 s (ref 11.4–15.2)

## 2023-02-18 MED ORDER — SODIUM CHLORIDE 0.9 % IV BOLUS
1000.0000 mL | Freq: Once | INTRAVENOUS | Status: AC
  Administered 2023-02-19: 1000 mL via INTRAVENOUS

## 2023-02-18 NOTE — ED Provider Triage Note (Signed)
Emergency Medicine Provider Triage Evaluation Note  Kyle Spence , a 89 y.o. male  was evaluated in triage.  Pt complains of altered mental status.  Patient recently dx with covid.  Decreased movement, speech,"everything"  per son.    Also recent UTI and has been on 3 medications.    Review of Systems  Positive: Covid, CKD stage 3,  Negative: No prior hx of HTN per son.   Physical Exam  BP (!) 234/94 (BP Location: Left Arm)   Pulse 70   Temp 98.6 F (37 C) (Oral)   Resp 16   Wt 91.9 kg   SpO2 97%   BMI 29.07 kg/m  Gen:   Awake, no distress    Non-verbal  Resp:  Normal effort    Lungs clear MSK:   Moves extremities without difficulty  Other:    Medical Decision Making  Medically screening exam initiated at 2:58 PM.  Appropriate orders placed.  Kyle Spence was informed that the remainder of the evaluation will be completed by another provider, this initial triage assessment does not replace that evaluation, and the importance of remaining in the ED until their evaluation is complete.     [name], PA-C 02/18/23 [phone]

## 2023-02-18 NOTE — ED Triage Notes (Signed)
Arrives with son.  Seen through [facility] on Sunday, diagnosed with COVID.  Prescribed Paxlovid, but was advised to not take due to history of kidney disease. At baseline patient walked with assist and walker, fed self.  Tuesday started to have garbled speech, not feeding self, following commands, but speech remains garbled, and speech not always appropriate.

## 2023-02-18 NOTE — ED Notes (Signed)
Attempted to draw blood, unsuccessful. Phlebotomy called. 

## 2023-02-18 NOTE — ED Notes (Signed)
First Nurse Note: Pt to ED via [facility] from home. Pt was diagnosed with Covid on Sunday. Pt has been declining since then. Pt can't feed self or swallow very well, pt is not speaking at all today.  LKW- sometime on Tuesday  197/87 BP 70 HR 96% RA 98.9 Ax 106- CBG

## 2023-02-19 ENCOUNTER — Encounter: Payer: Self-pay | Admitting: Internal Medicine

## 2023-02-19 ENCOUNTER — Other Ambulatory Visit: Payer: Self-pay

## 2023-02-19 DIAGNOSIS — U071 COVID-19: Secondary | ICD-10-CM | POA: Insufficient documentation

## 2023-02-19 DIAGNOSIS — N390 Urinary tract infection, site not specified: Secondary | ICD-10-CM | POA: Insufficient documentation

## 2023-02-19 DIAGNOSIS — R531 Weakness: Secondary | ICD-10-CM | POA: Diagnosis not present

## 2023-02-19 DIAGNOSIS — G9340 Encephalopathy, unspecified: Secondary | ICD-10-CM | POA: Diagnosis present

## 2023-02-19 LAB — URINALYSIS, W/ REFLEX TO CULTURE (INFECTION SUSPECTED)
Bilirubin Urine: NEGATIVE
Glucose, UA: NEGATIVE mg/dL
Hgb urine dipstick: NEGATIVE
Ketones, ur: NEGATIVE mg/dL
Nitrite: NEGATIVE
Protein, ur: 100 mg/dL — AB
Specific Gravity, Urine: 1.016 (ref 1.005–1.030)
WBC, UA: >50 WBC/hpf (ref 0–5)
pH: 5 (ref 5.0–8.0)

## 2023-02-19 LAB — TROPONIN I (HIGH SENSITIVITY): Troponin I (High Sensitivity): 8 ng/L (ref <18)

## 2023-02-19 MED ORDER — SODIUM CHLORIDE 0.9 % IV SOLN
1.0000 g | INTRAVENOUS | Status: DC
  Administered 2023-02-19 – 2023-02-21 (×3): 1 g via INTRAVENOUS
  Filled 2023-02-19 (×3): qty 10

## 2023-02-19 MED ORDER — OXYBUTYNIN CHLORIDE ER 5 MG PO TB24
5.0000 mg | ORAL_TABLET | Freq: Every day | ORAL | Status: DC
  Administered 2023-02-21 – 2023-02-22 (×2): 5 mg via ORAL
  Filled 2023-02-19 (×4): qty 1

## 2023-02-19 MED ORDER — LABETALOL HCL 5 MG/ML IV SOLN
20.0000 mg | INTRAVENOUS | Status: DC | PRN
  Administered 2023-02-19 (×2): 20 mg via INTRAVENOUS
  Filled 2023-02-19 (×2): qty 4

## 2023-02-19 MED ORDER — ONDANSETRON HCL 4 MG/2ML IJ SOLN: 4.0000 mg | Freq: Four times a day (QID) | INTRAMUSCULAR | Status: DC | PRN

## 2023-02-19 MED ORDER — ALLOPURINOL 100 MG PO TABS
100.0000 mg | ORAL_TABLET | Freq: Every day | ORAL | Status: DC
Start: 2023-02-19 — End: 2023-02-23
  Administered 2023-02-20 – 2023-02-22 (×3): 100 mg via ORAL
  Filled 2023-02-19 (×5): qty 1

## 2023-02-19 MED ORDER — ASPIRIN 81 MG PO TBEC
81.0000 mg | DELAYED_RELEASE_TABLET | Freq: Every day | ORAL | Status: DC
  Administered 2023-02-19: 81 mg via ORAL
  Filled 2023-02-19 (×2): qty 1

## 2023-02-19 MED ORDER — GUAIFENESIN-DM 100-10 MG/5ML PO SYRP
5.0000 mL | ORAL_SOLUTION | ORAL | Status: DC | PRN
  Administered 2023-02-19: 5 mL via ORAL
  Filled 2023-02-19: qty 10

## 2023-02-19 MED ORDER — ACETAMINOPHEN 325 MG PO TABS
325.0000 mg | ORAL_TABLET | Freq: Four times a day (QID) | ORAL | Status: DC | PRN
  Filled 2023-02-19: qty 2

## 2023-02-19 MED ORDER — HEPARIN SODIUM (PORCINE) 5000 UNIT/ML IJ SOLN
5000.0000 [IU] | Freq: Two times a day (BID) | INTRAMUSCULAR | Status: DC
  Administered 2023-02-19 – 2023-02-22 (×8): 5000 [IU] via SUBCUTANEOUS
  Filled 2023-02-19 (×8): qty 1

## 2023-02-19 MED ORDER — ONDANSETRON HCL 4 MG PO TABS
4.0000 mg | ORAL_TABLET | Freq: Four times a day (QID) | ORAL | Status: DC | PRN
Start: 2023-02-19 — End: 2023-02-23

## 2023-02-19 MED ORDER — SODIUM CHLORIDE 0.9 % IV SOLN
1.0000 g | Freq: Once | INTRAVENOUS | Status: AC
  Administered 2023-02-19: 1 g via INTRAVENOUS
  Filled 2023-02-19: qty 10

## 2023-02-19 MED ORDER — ATORVASTATIN CALCIUM 20 MG PO TABS
40.0000 mg | ORAL_TABLET | Freq: Every day | ORAL | Status: DC
  Administered 2023-02-19 – 2023-02-22 (×4): 40 mg via ORAL
  Filled 2023-02-19 (×4): qty 2

## 2023-02-19 MED ORDER — AMLODIPINE BESYLATE 5 MG PO TABS
5.0000 mg | ORAL_TABLET | Freq: Every day | ORAL | Status: DC
  Administered 2023-02-19 – 2023-02-22 (×4): 5 mg via ORAL
  Filled 2023-02-19 (×4): qty 1

## 2023-02-19 MED ORDER — TAMSULOSIN HCL 0.4 MG PO CAPS
0.4000 mg | ORAL_CAPSULE | Freq: Every day | ORAL | Status: DC
  Administered 2023-02-19 – 2023-02-22 (×4): 0.4 mg via ORAL
  Filled 2023-02-19 (×4): qty 1

## 2023-02-19 MED ORDER — DOCUSATE SODIUM 50 MG/5ML PO LIQD: 50.0000 mg | Freq: Every day | ORAL | Status: DC | PRN

## 2023-02-19 NOTE — Progress Notes (Signed)
[facility] Liaison note:   This patient is currently enrolled in [facility] outpatient-based palliative care.  [facility] will continue to follow for discharge disposition.    Please call for any outpatient based palliative care related questions or concerns.   [facility] Liaison [phone]

## 2023-02-19 NOTE — Plan of Care (Signed)

## 2023-02-19 NOTE — ED Provider Notes (Signed)
[facility] Provider Note    Event Date/Time   First MD Initiated Contact with Patient 02/18/23 2302     (approximate)   History   Altered Mental Status   HPI  Kyle Spence is a 89 y.o. male brought to the ED from home by his son with a chief complaint of generalized weakness.  Patient seen at urgent care on 02/15/2023 with cold-like symptoms.  Tested positive for COVID.  He was prescribed Paxlovid but his PCP advised him not to take it due to history of kidney insufficiency.  Son states patient at baseline ambulates with walker and is otherwise independent.  Decline in health and ADLs over the past several days to the point where her son is having to feed him and patient is not able to get up by himself secondary to generalized weakness.  Denies fever/chills, chest pain, shortness of breath, abdominal pain, nausea, vomiting or diarrhea.  Son states patient fell several days ago due to weakness.     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   Edema leg 02/07/2015   Overview:  chronic    Essential (primary) hypertension 02/07/2015   H/O: gout 04/26/2014   Peripheral nerve disease 02/07/2015     Active Problem List   Patient Active Problem List   Diagnosis Date Noted   Generalized weakness 02/19/2023   Hyponatremia 10/19/2022   Acute metabolic encephalopathy 10/19/2022   Gout 10/19/2022   Nausea vomiting and diarrhea 10/19/2022   Obesity (BMI 30-39.9) 10/19/2022   BPH (benign prostatic hyperplasia) 10/19/2022   Acute cystitis without hematuria    Mild dementia without behavioral disturbance, psychotic disturbance, mood disturbance, or anxiety (HCC)    Hip  fracture (HCC) 12/17/2020   Pre-op evaluation    Impaired fasting glucose    Fracture of femoral neck, left (HCC) 12/16/2020   Stage 3b chronic kidney disease (CKD) (HCC) 12/16/2020   Leukocytosis 12/16/2020   Peripheral artery disease (HCC) 05/13/2016   Swelling of limb 03/11/2016   Pain in limb 03/11/2016   Borderline diabetes mellitus 02/07/2015   Chronic constipation 02/07/2015   Chronic renal failure 02/07/2015   Essential (primary) hypertension 02/07/2015   Edema leg 02/07/2015   Hyperlipidemia 02/07/2015   Adiposity 02/07/2015   Arthritis, degenerative 02/07/2015   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 Authorizing Provider  acetaminophen (TYLENOL) 325 MG tablet Take 1-2 tablets (325-650 mg total) by mouth every 6 (six) hours as needed for mild pain (pain score 1-3 or temp > 100.5). 12/19/20   Lurene Shadow, MD  allopurinol (ZYLOPRIM) 100 MG tablet Take 100 mg by mouth  daily. 01/20/15   [provider]  aspirin EC 81 MG tablet Take 81 mg by mouth daily.    [provider]  atorvastatin (LIPITOR) 40 MG tablet Take 40 mg by mouth daily. 12/28/14   [provider]  docusate sodium (COLACE) 50 MG capsule Take by mouth.    [provider]  furosemide (LASIX) 20 MG tablet Take 1 tablet (20 mg total) by mouth daily. 12/20/20   Lurene Shadow, MD  glucosamine-chondroitin 500-400 MG tablet Take 1  tablet by mouth 3 (three) times daily.    [provider]  Glycerin-Hypromellose-PEG 400 0.2-0.2-1 % SOLN Apply to eye. Patient not taking: Reported on 10/19/2022    [provider]  Multiple Vitamin (MULTI-VITAMINS) TABS Take 1 tablet by mouth daily.    [provider]  Nutritional Supplements (PROSTATE 2.4) CAPS Take by mouth.    [provider]  oxybutynin (DITROPAN-XL) 5 MG 24 hr tablet Take 5 mg by mouth daily. 01/25/21   [provider]  tamsulosin (FLOMAX) 0.4 MG CAPS capsule Take 0.4 mg by mouth daily.    [provider]  verapamil (VERELAN PM) 240 MG 24 hr capsule Take 1 capsule (240 mg total) by mouth daily. 12/19/20   Lurene Shadow, MD     Allergies  Enalapril and Penicillins   Family History   Family History  Problem Relation Age of Onset   Prostate cancer Neg Hx    Kidney cancer Neg Hx    Bladder Cancer Neg Hx      Physical Exam  Triage Vital Signs: ED Triage Vitals  Encounter Vitals Group     BP 02/18/23 1452 (!) 234/94     Systolic BP Percentile --      Diastolic BP Percentile --      Pulse Rate 02/18/23 1452 70     Resp 02/18/23 1452 16     Temp 02/18/23 1452 98.6 F (37 C)     Temp Source 02/18/23 1452 Oral     SpO2 02/18/23 1452 97 %     Weight 02/18/23 1450 202 lb 9.6 oz (91.9 kg)     Height --      Head Circumference --      Peak Flow --      Pain Score --      Pain Loc --      Pain Education --      Exclude from Growth Chart --     Updated Vital Signs: BP (!) 168/80 (BP Location: Left Arm)   Pulse 75   Temp 98.2 F (36.8 C) (Oral)   Resp 20   Wt 91.9 kg   SpO2 98%   BMI 29.07 kg/m    General: Awake, moderate distress.  CV:  RRR.  Good peripheral perfusion.  Resp:  Normal effort.  Diminished, otherwise CTAB. Abd:  Nontender.  No distention.  Other:  Mildly dry mucous membranes.  Extremely weak, soft voice, cannot sit up by himself in the bed.   ED Results / Procedures /  Treatments  Labs (all labs ordered are listed, but only abnormal results are displayed) Labs Reviewed  COMPREHENSIVE METABOLIC PANEL - Abnormal; Notable for the following components:      Result Value   Sodium 130 (*)    Chloride 95 (*)    BUN 28 (*)    Calcium 8.6 (*)    Albumin 3.2 (*)    AST 42 (*)    ALT 50 (*)  All other components within normal limits  CBC WITH DIFFERENTIAL/PLATELET - Abnormal; Notable for the following components:   WBC 11.1 (*)    Neutro Abs 8.3 (*)    All other components within normal limits  URINALYSIS, W/ REFLEX TO CULTURE (INFECTION SUSPECTED) - Abnormal; Notable for the following components:   Color, Urine YELLOW (*)    APPearance CLOUDY (*)    Protein, ur 100 (*)    Leukocytes,Ua LARGE (*)    Bacteria, UA FEW (*)    All other components within normal limits  CULTURE, BLOOD (ROUTINE X 2)  CULTURE, BLOOD (ROUTINE X 2)  URINE CULTURE  LACTIC ACID, PLASMA  PROTIME-INR  APTT  TROPONIN I (HIGH SENSITIVITY)     EKG  ED ECG REPORT I, Jennet Scroggin J, the attending physician, personally viewed and interpreted this ECG.   Date: 02/19/2023  EKG Time: 0103  Rate: 74  Rhythm: normal sinus rhythm  Axis: Normal  Intervals:none  ST&T Change: Nonspecific    RADIOLOGY I have independently visualized and interpreted patient's imaging studies as well as noted the radiology interpretation:  CT head: No ICH  Chest x-ray: Left lateral fifth rib fracture, acute to subacute  Official radiology report(s): CT HEAD WO CONTRAST ( ) Result Date: 02/18/2023 CLINICAL DATA:  Hypertensive, altered mental status. Evaluate for intracranial hemorrhage. Diagnosed with COVID four days ago with failure to thrive since then, loss of ability to speak. EXAM: CT HEAD WITHOUT CONTRAST TECHNIQUE: Contiguous axial images were obtained from the base of the skull through the vertex without intravenous contrast. RADIATION DOSE REDUCTION: This exam was performed according to  the departmental dose-optimization program which includes automated exposure control, adjustment of the mA and/or kV according to patient size and/or use of iterative reconstruction technique. COMPARISON:  Head CT 10/18/2022, brain MRI 10/19/2022. FINDINGS: Brain: There is moderately advanced global atrophy, with atrophic ventriculomegaly and moderate to severe small vessel disease of the cerebral white matter. There are tiny chronic bilateral lacunar infarcts in the gangliocapsular areas. No cortical based acute infarct, hemorrhage, mass or mass effect is seen. No midline shift. Basal cisterns are clear. Vascular: Moderate to heavy calcifications noted both distal vertebral arteries, both siphons. No hyperdense central vessel is seen. Skull: There is a small scalp lipoma right frontal area. No skull fractures or focal skull lesions are seen. Sinuses/Orbits: There is scattered fluid in the lower medial right mastoid air cells, seen previously. The mastoid air cells are otherwise clear. Paranasal sinuses are clear. Midline nasal septum. Old lens extractions with otherwise negative orbits. Other: None. IMPRESSION: 1. No acute intracranial CT findings or interval changes. 2. Atrophy and small-vessel disease. 3. Right mastoid effusion, unchanged. Electronically Signed   By: Almira Bar M.D.   On: 02/18/2023 22:57   DG Chest Port 1 View Result Date: 02/18/2023 CLINICAL DATA:  Sepsis EXAM: PORTABLE CHEST 1 VIEW COMPARISON:  10/18/2022 FINDINGS: Single frontal view of the chest demonstrates a stable cardiac silhouette. No airspace disease, effusion, or pneumothorax. There is an acute to subacute left lateral fifth rib fracture. No other acute bony abnormalities. IMPRESSION: 1. Acute to subacute left lateral fifth rib fracture. This is not visible on the comparison exam. 2. No acute intrathoracic process. Electronically Signed   By: Sharlet Salina M.D.   On: 02/18/2023 15:36     PROCEDURES:  Critical Care  performed: No  .1-3 Lead EKG Interpretation  Performed by: Irean Hong, MD Authorized by: Irean Hong, MD     Interpretation: normal  ECG rate:  75   ECG rate assessment: normal     Rhythm: sinus rhythm     Ectopy: none     Conduction: normal   Comments:     Patient placed on cardiac monitor to evaluate for arrhythmias    MEDICATIONS ORDERED IN ED: Medications  cefTRIAXone (ROCEPHIN) 1 g in sodium chloride 0.9 % 100 mL IVPB (has no administration in time range)  sodium chloride 0.9 % bolus 1,000 mL (1,000 mLs Intravenous Bolus from Bag 02/19/23 0158)     IMPRESSION / MDM / ASSESSMENT AND PLAN / ED COURSE  I reviewed the triage vital signs and the nursing notes.                             89 year old male presenting with generalized weakness in the setting of positive COVID test several days ago.  Differential diagnosis includes but is not limited to ACS, infectious, metabolic etiologies, etc.  I personally reviewed patient's records and note his urgent care visit from 02/15/2023 and noted positive COVID result.  Patient's presentation is most consistent with acute complicated illness / injury requiring diagnostic workup.  The patient is on the cardiac monitor to evaluate for evidence of arrhythmia and/or significant heart rate changes.  Laboratories else demonstrate mild leukocytosis with WBC 11, hyponatremia with sodium 130, negative troponin.  Lactic acid is negative.  CT head and chest x-ray unremarkable.  Son mentions patient had back to back-to-back UTIs in the fall and is currently scheduled to see urology.  Will obtain in/out cath, start IV antibiotics as needed.  For now, initiate IV fluid hydration, supportive care and consult hospitalist services for evaluation and admission.  Clinical Course as of 02/19/23 0256  Thu Feb 19, 2023  0243 Large leukocyte positive UTI, will start IV antibiotics.  Appreciate pharmacy consult for antibiotic guidance given penicillin  allergy with swelling. [JS]    Clinical Course User Index [JS] Irean Hong, MD     FINAL CLINICAL IMPRESSION(S) / ED DIAGNOSES   Final diagnoses:  Altered mental status, unspecified altered mental status type  Dehydration  Generalized weakness  Closed fracture of one rib of left side, initial encounter  Lower urinary tract infectious disease     Rx / DC Orders   ED Discharge Orders     None        Note:  This document was prepared using Dragon voice recognition software and may include unintentional dictation errors.   Irean Hong, MD 02/19/23 [phone]

## 2023-02-19 NOTE — H&P (Addendum)
History and Physical    TAHA WALDBILLIG WJX:[id number] DOB: 08-04-1932 DOA: 02/18/2023  PCP: Marisue Ivan, MD (Confirm with patient/family/NH records and if not entered, this has to be entered at [facility] point of entry) Patient coming from: Home  I have personally briefly reviewed patient's old medical records in [facility]  Chief Complaint: Patient is confused and has no complaints.  HPI: KAYLAN FENGER is a 89 y.o. male with medical history significant of advanced dementia, chronic ambulation impairment, BPH, frequent recurrent UTIs, HTN, gout, CKD stage II, peripheral neuropathy, brought in by family member for evaluation of worsening of generalized weakness and confusion.  Patient is confused and unable to provide any history, all history provided by son over the phone.  Family reported that last Friday patient started to have nasal congestion and cough and generalized weakness and appeared to be more confused.  Saturday, son took him to urgent care, COVID screening came back positive.  Patient was prescribed with Paxlovid and sent home.  PCP however instructed that patient not starting Paxlovid because his kidney problems, instead patient was prescribed with as needed medication including Tylenol, Zyrtec and guaifenesin.  In the next few days however patient condition not improving and patient has not been able to get off his bed since Sunday.  No significant fever as per family member.  And chronically patient has urinary incontinence and has had 3 UTIs since October last year.  Patient's PCP has referred patient to see urologist and the appointment was made for late February.  No falls. ED Course: Afebrile, blood pressure significantly elevated.  CT head showed no acute intracranial changes but atrophy and small vessel disease.  Showed WBC 11.1, creatinine 0.9, sodium 130 bicarb 25.  UA showed WBC more than 50.  Patient was started on ceftriaxone in the ED.  Review of Systems: Unable  to perform, patient is confused at baseline.   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 He has never used smokeless tobacco. He reports that he does not drink alcohol and does not use drugs.  Allergies  Allergen Reactions   Enalapril Other (See Comments)    HYPERKALEMIA    Penicillins Swelling    Family History  Problem Relation Age of Onset   Prostate cancer Neg Hx    Kidney cancer Neg Hx    Bladder Cancer Neg Hx      Prior to Admission medications   Medication Sig Start Date End Date Taking? Authorizing Provider  acetaminophen (TYLENOL) 325 MG tablet Take 1-2 tablets (325-650 mg total) by mouth every 6 (six) hours as needed for mild pain (pain score 1-3  or temp > 100.5). 12/19/20   Lurene Shadow, MD  allopurinol (ZYLOPRIM) 100 MG tablet Take 100 mg by mouth daily. 01/20/15   [provider]  aspirin EC 81 MG tablet Take 81 mg by mouth daily.    [provider]  atorvastatin (LIPITOR) 40 MG tablet Take 40 mg by mouth daily. 12/28/14   [provider]  docusate sodium (COLACE) 50 MG capsule Take by mouth.    [provider]   furosemide (LASIX) 20 MG tablet Take 1 tablet (20 mg total) by mouth daily. 12/20/20   Lurene Shadow, MD  glucosamine-chondroitin 500-400 MG tablet Take 1 tablet by mouth 3 (three) times daily.    [provider]  Glycerin-Hypromellose-PEG 400 0.2-0.2-1 % SOLN Apply to eye. Patient not taking: Reported on 10/19/2022    [provider]  Multiple Vitamin (MULTI-VITAMINS) TABS Take 1 tablet by mouth daily.    [provider]  Nutritional Supplements (PROSTATE 2.4) CAPS Take by mouth.    [provider]  oxybutynin (DITROPAN-XL) 5 MG 24 hr tablet Take 5 mg by mouth daily. 01/25/21   [provider]  tamsulosin (FLOMAX) 0.4 MG CAPS capsule Take 0.4 mg by mouth daily.    [provider]  verapamil (VERELAN PM) 240 MG 24 hr capsule Take 1 capsule (240 mg total) by mouth daily. 12/19/20   Lurene Shadow, MD    Physical Exam: Vitals:   02/19/23 0330 02/19/23 0600 02/19/23 0630 02/19/23 0651  BP: (!) 194/92 (!) 178/99  (!) 178/99  Pulse: 74  66 66  Resp: 18  (!) 21 (!) 21  Temp:    99.3 F (37.4 C)  TempSrc:    Oral  SpO2: 98%  99% 99%  Weight:        Constitutional: NAD, calm, comfortable Vitals:   02/19/23 0330 02/19/23 0600 02/19/23 0630 02/19/23 0651  BP: (!) 194/92 (!) 178/99  (!) 178/99  Pulse: 74  66 66  Resp: 18  (!) 21 (!) 21  Temp:    99.3 F (37.4 C)  TempSrc:    Oral  SpO2: 98%  99% 99%  Weight:       Eyes: PERRL, lids and conjunctivae normal ENMT: Mucous membranes are moist. Posterior pharynx clear of any exudate or lesions.Normal dentition.  Neck: normal, supple, no masses, no thyromegaly Respiratory: clear to auscultation bilaterally, no wheezing, no crackles. Normal respiratory effort. No accessory muscle use.  Cardiovascular: Regular rate and rhythm, no murmurs / rubs / gallops. No extremity edema. 2+ pedal pulses. No carotid bruits.  Abdomen: no tenderness, no masses palpated. No hepatosplenomegaly. Bowel sounds  positive.  Musculoskeletal: no clubbing / cyanosis. No joint deformity upper and lower extremities. Good ROM, no contractures. Normal muscle tone.  Skin: no rashes, lesions, ulcers. No induration Neurologic: No facial droops, moving all limbs, not following command Psychiatric: Awake, confused    Labs on Admission: I have personally reviewed following labs and imaging studies  CBC: Recent Labs  Lab 02/18/23 1645  WBC 11.1*  NEUTROABS 8.3*  HGB 13.7  HCT 40.1  MCV 93.0  PLT 347   Basic Metabolic Panel: Recent Labs  Lab 02/18/23 1645  NA 130*  K 3.8  CL 95*  CO2 25  GLUCOSE 89  BUN 28*  CREATININE 0.90  CALCIUM 8.6*   GFR: Estimated Creatinine Clearance: 64.7 mL/min (by C-G formula based on SCr of 0.9 mg/dL). Liver Function Tests: Recent Labs  Lab 02/18/23 1645  AST 42*  ALT  50*  ALKPHOS 102  BILITOT 0.8  PROT 6.5  ALBUMIN 3.2*   No results for input(s): "LIPASE", "AMYLASE" in the last 168 hours. No results for input(s): "AMMONIA" in the last 168 hours. Coagulation Profile: Recent Labs  Lab 02/18/23 1645  INR 1.2   Cardiac Enzymes: No results for input(s): "CKTOTAL", "CKMB", "CKMBINDEX", "TROPONINI" in the last 168 hours. BNP (last 3 results) No results for input(s): "PROBNP" in the last 8760 hours. HbA1C: No results for input(s): "HGBA1C" in the last 72 hours. CBG: No results for input(s): "GLUCAP" in the last 168 hours. Lipid Profile: No results for input(s): "CHOL", "HDL", "LDLCALC", "TRIG", "CHOLHDL", "LDLDIRECT" in the last 72 hours. Thyroid Function Tests: No results for input(s): "TSH", "T4TOTAL", "FREET4", "T3FREE", "THYROIDAB" in the last 72 hours. Anemia Panel: No results for input(s): "VITAMINB12", "FOLATE", "FERRITIN", "TIBC", "IRON", "RETICCTPCT" in the last 72 hours. Urine analysis:    Component Value Date/Time   COLORURINE YELLOW (A) 02/19/2023 0116   APPEARANCEUR CLOUDY (A) 02/19/2023 0116   APPEARANCEUR Clear 03/09/2015 1333    LABSPEC 1.016 02/19/2023 0116   PHURINE 5.0 02/19/2023 0116   GLUCOSEU NEGATIVE 02/19/2023 0116   HGBUR NEGATIVE 02/19/2023 0116   BILIRUBINUR NEGATIVE 02/19/2023 0116   BILIRUBINUR Negative 03/09/2015 1333   KETONESUR NEGATIVE 02/19/2023 0116   PROTEINUR 100 (A) 02/19/2023 0116   NITRITE NEGATIVE 02/19/2023 0116   LEUKOCYTESUR LARGE (A) 02/19/2023 0116    Radiological Exams on Admission: CT HEAD WO CONTRAST ( ) Result Date: 02/18/2023 CLINICAL DATA:  Hypertensive, altered mental status. Evaluate for intracranial hemorrhage. Diagnosed with COVID four days ago with failure to thrive since then, loss of ability to speak. EXAM: CT HEAD WITHOUT CONTRAST TECHNIQUE: Contiguous axial images were obtained from the base of the skull through the vertex without intravenous contrast. RADIATION DOSE REDUCTION: This exam was performed according to the departmental dose-optimization program which includes automated exposure control, adjustment of the mA and/or kV according to patient size and/or use of iterative reconstruction technique. COMPARISON:  Head CT 10/18/2022, brain MRI 10/19/2022. FINDINGS: Brain: There is moderately advanced global atrophy, with atrophic ventriculomegaly and moderate to severe small vessel disease of the cerebral white matter. There are tiny chronic bilateral lacunar infarcts in the gangliocapsular areas. No cortical based acute infarct, hemorrhage, mass or mass effect is seen. No midline shift. Basal cisterns are clear. Vascular: Moderate to heavy calcifications noted both distal vertebral arteries, both siphons. No hyperdense central vessel is seen. Skull: There is a small scalp lipoma right frontal area. No skull fractures or focal skull lesions are seen. Sinuses/Orbits: There is scattered fluid in the lower medial right mastoid air cells, seen previously. The mastoid air cells are otherwise clear. Paranasal sinuses are clear. Midline nasal septum. Old lens extractions with otherwise  negative orbits. Other: None. IMPRESSION: 1. No acute intracranial CT findings or interval changes. 2. Atrophy and small-vessel disease. 3. Right mastoid effusion, unchanged. Electronically Signed   By: Almira Bar M.D.   On: 02/18/2023 22:57   DG Chest Port 1 View Result Date: 02/18/2023 CLINICAL DATA:  Sepsis EXAM: PORTABLE CHEST 1 VIEW COMPARISON:  10/18/2022 FINDINGS: Single frontal view of the chest demonstrates a stable cardiac silhouette. No airspace disease, effusion, or pneumothorax. There is an acute to subacute left lateral fifth rib fracture. No other acute bony abnormalities. IMPRESSION: 1. Acute to subacute left lateral fifth rib fracture. This is not visible on the comparison exam. 2. No acute intrathoracic process. Electronically Signed   By: Sharlet Salina  M.D.   On: 02/18/2023 15:36    EKG: Independently reviewed.  Sinus rhythm, no acute ST changes.  Assessment/Plan Principal Problem:   Generalized weakness Active Problems:   COVID-19 virus infection   UTI (urinary tract infection)   Encephalopathy  (please populate well all problems here in Problem List. (For example, if patient is on BP meds at home and you resume or decide to hold them, it is a problem that needs to be her. Same for CAD, COPD, HLD and so on)  Acute metabolic encephalopathy -Secondary to UTI, treat UTI then reevaluate -CT head reassuring  Recurrent UTI -Continue ceftriaxone -Check PVR -Outpatient urology follow-up for urodynamic study  HTN, uncontrolled -Change verapamil to amlodipine  Acute on chronic ambulation impairment -PT evaluation  CKD stage II -Euvolemic and creatinine level stable  Advanced dementia -Patient is DNR/DNI  DVT prophylaxis: Heparin subcu Code Status: DNR Family Communication: Son over the phone Disposition Plan: Expect less than 2 midnight hospital stay Consults called: None Admission status: Telemetry observation   Emeline General MD [facility] Pager  [phone]  02/19/2023, 8:54 AM

## 2023-02-19 NOTE — Evaluation (Signed)
Physical Therapy Evaluation Patient Details Name: Kyle Spence MRN: [id number] DOB: 10-Oct-1932 Today's Date: 02/19/2023  History of Present Illness  89yoM with PMH: dementia, PVD, gout, CKD3b, BPH comes to [facility] ED after several days lethargy, AMS. Pt is total care at home with son and pain caregivers prn.  Clinical Impression  Pt awake on entry, BP very elevated as per history on monitor in ED, auto BP >238mmHg systolic for a few hours- RN aware. SOn in room, primary caregiver. He makes known desire to avoid facility placement at all costs. Pt well set up for care at home, has excellent support, but author recommends integrating hoyer lift for OOB mobility at DC- we discussed the tendency of COVID malaise in advanced age to be lengthy, to anticipate increased assistance needs for ~1-3 weeks. Son also educated on role of hoyer lift v STS lift as he has seen both in the past, but unclear on indications and contraindications. Pt largely minimally responsive to author while in room, which son says has been the case since ~2-3 days. Will commence further mobility assessment in future once vitals are stable. Will continue to follow.       If plan is discharge home, recommend the following: Two people to help with bathing/dressing/bathroom;Two people to help with walking and/or transfers;Help with stairs or ramp for entrance;Assistance with feeding;Assistance with cooking/housework;Assist for transportation;Direct supervision/assist for medications management;Supervision due to cognitive status   Can travel by private vehicle        Equipment Recommendations Hoyer lift  Recommendations for Other Services       Functional Status Assessment Patient has had a recent decline in their functional status and demonstrates the ability to make significant improvements in function in a reasonable and predictable amount of time.     Precautions / Restrictions Precautions Precautions:  Fall Restrictions Weight Bearing Restrictions Per Provider Order: No      Mobility  Bed Mobility Overal bed mobility:  (largely deferrd due to mental status but mostly elevate BP with SBP >269mmHg x3 hours)                  Transfers                        Ambulation/Gait                  Stairs            Wheelchair Mobility     Tilt Bed    Modified Rankin (Stroke Patients Only)       Balance                                             Pertinent Vitals/Pain Pain Assessment Pain Assessment: PAINAD Breathing: normal Negative Vocalization: none Facial Expression: smiling or inexpressive (flat affect) Body Language: tense, distressed pacing, fidgeting Consolability: no need to console PAINAD Score: 1    Home Living Family/patient expects to be discharged to:: Private residence Living Arrangements: Alone Available Help at Discharge: Family Type of Home: House Home Access: Ramped entrance       Home Layout: One level Home Equipment: Agricultural consultant (2 wheels);Rollator (4 wheels);Cane - single point;Shower seat;Toilet riser;Grab bars - toilet;Grab bars - tub/shower;Wheelchair - manual;Lift chair;Hospital bed;Other (comment) Additional Comments: has another rollator with bilateral UE platforms (upwalker)    Prior Function  Prior Level of Function : Needs assist             Mobility Comments: so assists with AMB ~8x daily c upwalker and WC follow; does not walk unassisted; ADLs Comments: Son assists with pericare after toileting, LB dressing, meals, supervision for UB dressing, assist for seated shower, assist for all IADL. Pt receives meals on wheels and is typically able to feed himself. Typically mod indep with seated grooming tasks.     Extremity/Trunk Assessment                Communication      Cognition Arousal:  (awake, makes eyecontact, otherwise, minimally responsive to engagement)                                               General Comments      Exercises     Assessment/Plan    PT Assessment Patient needs continued PT services  PT Problem List Decreased activity tolerance;Decreased mobility;Decreased cognition;Decreased knowledge of precautions;Decreased strength       PT Treatment Interventions DME instruction;Gait training;Patient/family education;Stair training;Functional mobility training;Therapeutic activities;Therapeutic exercise;Balance training;Cognitive remediation    PT Goals (Current goals can be found in the Care Plan section)  Acute Rehab PT Goals Patient Stated Goal: be able to care for pt at home PT Goal Formulation: With family Time For Goal Achievement: 03/05/23 Potential to Achieve Goals: Fair    Frequency Min 1X/week     Co-evaluation               AM-PAC PT "6 Clicks" Mobility  Outcome Measure Help needed turning from your back to your side while in a flat bed without using bedrails?: Total Help needed moving from lying on your back to sitting on the side of a flat bed without using bedrails?: Total Help needed moving to and from a bed to a chair (including a wheelchair)?: Total Help needed standing up from a chair using your arms (e.g., wheelchair or bedside chair)?: Total Help needed to walk in hospital room?: Total Help needed climbing 3-5 steps with a railing? : Total 6 Click Score: 6    End of Session   Activity Tolerance: Treatment limited secondary to medical complications (Comment);Patient limited by lethargy Patient left: in bed;with family/visitor present;with call bell/phone within reach   PT Visit Diagnosis: Unsteadiness on feet (R26.81);Other abnormalities of gait and mobility (R26.89);Muscle weakness (generalized) (M62.81);Other symptoms and signs involving the nervous system (R29.898);Difficulty in walking, not elsewhere classified (R26.2)    Time: 1010-1041 PT Time Calculation (min) (ACUTE  ONLY): 31 min   Charges:   PT Evaluation $PT Eval Moderate Complexity: 1 Mod PT Treatments $Self Care/Home Management: 8-22 PT General Charges $$ ACUTE PT VISIT: 1 Visit        4:03 PM, 02/19/23 Rosamaria Lints, PT, DPT Physical Therapist - [facility]  [phone] (ASCOM)    Chetan Mehring C 02/19/2023, 3:59 PM

## 2023-02-19 NOTE — ED Notes (Signed)
In and out cath preformed with collected. Sterile technique was used.

## 2023-02-20 DIAGNOSIS — Z87440 Personal history of urinary (tract) infections: Secondary | ICD-10-CM | POA: Diagnosis not present

## 2023-02-20 DIAGNOSIS — Z66 Do not resuscitate: Secondary | ICD-10-CM | POA: Diagnosis present

## 2023-02-20 DIAGNOSIS — N390 Urinary tract infection, site not specified: Secondary | ICD-10-CM | POA: Diagnosis present

## 2023-02-20 DIAGNOSIS — R32 Unspecified urinary incontinence: Secondary | ICD-10-CM | POA: Diagnosis present

## 2023-02-20 DIAGNOSIS — G629 Polyneuropathy, unspecified: Secondary | ICD-10-CM | POA: Diagnosis present

## 2023-02-20 DIAGNOSIS — M109 Gout, unspecified: Secondary | ICD-10-CM | POA: Diagnosis present

## 2023-02-20 DIAGNOSIS — W19XXXA Unspecified fall, initial encounter: Secondary | ICD-10-CM | POA: Diagnosis present

## 2023-02-20 DIAGNOSIS — R531 Weakness: Secondary | ICD-10-CM | POA: Diagnosis not present

## 2023-02-20 DIAGNOSIS — E860 Dehydration: Secondary | ICD-10-CM | POA: Diagnosis present

## 2023-02-20 DIAGNOSIS — N400 Enlarged prostate without lower urinary tract symptoms: Secondary | ICD-10-CM | POA: Diagnosis present

## 2023-02-20 DIAGNOSIS — G9389 Other specified disorders of brain: Secondary | ICD-10-CM | POA: Diagnosis present

## 2023-02-20 DIAGNOSIS — Z79899 Other long term (current) drug therapy: Secondary | ICD-10-CM | POA: Diagnosis not present

## 2023-02-20 DIAGNOSIS — E871 Hypo-osmolality and hyponatremia: Secondary | ICD-10-CM | POA: Diagnosis present

## 2023-02-20 DIAGNOSIS — I129 Hypertensive chronic kidney disease with stage 1 through stage 4 chronic kidney disease, or unspecified chronic kidney disease: Secondary | ICD-10-CM | POA: Diagnosis present

## 2023-02-20 DIAGNOSIS — U071 COVID-19: Secondary | ICD-10-CM | POA: Diagnosis present

## 2023-02-20 DIAGNOSIS — Z880 Allergy status to penicillin: Secondary | ICD-10-CM | POA: Diagnosis not present

## 2023-02-20 DIAGNOSIS — Z96642 Presence of left artificial hip joint: Secondary | ICD-10-CM | POA: Diagnosis present

## 2023-02-20 DIAGNOSIS — G319 Degenerative disease of nervous system, unspecified: Secondary | ICD-10-CM | POA: Diagnosis present

## 2023-02-20 DIAGNOSIS — S2232XA Fracture of one rib, left side, initial encounter for closed fracture: Secondary | ICD-10-CM | POA: Diagnosis present

## 2023-02-20 DIAGNOSIS — Z7982 Long term (current) use of aspirin: Secondary | ICD-10-CM | POA: Diagnosis not present

## 2023-02-20 DIAGNOSIS — F0390 Unspecified dementia without behavioral disturbance: Secondary | ICD-10-CM | POA: Diagnosis present

## 2023-02-20 DIAGNOSIS — E785 Hyperlipidemia, unspecified: Secondary | ICD-10-CM | POA: Diagnosis present

## 2023-02-20 DIAGNOSIS — R7303 Prediabetes: Secondary | ICD-10-CM | POA: Diagnosis present

## 2023-02-20 DIAGNOSIS — Z888 Allergy status to other drugs, medicaments and biological substances status: Secondary | ICD-10-CM | POA: Diagnosis not present

## 2023-02-20 DIAGNOSIS — G9341 Metabolic encephalopathy: Secondary | ICD-10-CM | POA: Diagnosis present

## 2023-02-20 DIAGNOSIS — N182 Chronic kidney disease, stage 2 (mild): Secondary | ICD-10-CM | POA: Diagnosis present

## 2023-02-20 DIAGNOSIS — Y92009 Unspecified place in unspecified non-institutional (private) residence as the place of occurrence of the external cause: Secondary | ICD-10-CM | POA: Diagnosis not present

## 2023-02-20 LAB — BASIC METABOLIC PANEL
Anion gap: 15 (ref 5–15)
BUN: 25 mg/dL — ABNORMAL HIGH (ref 8–23)
CO2: 20 mmol/L — ABNORMAL LOW (ref 22–32)
Calcium: 8.7 mg/dL — ABNORMAL LOW (ref 8.9–10.3)
Chloride: 94 mmol/L — ABNORMAL LOW (ref 98–111)
Creatinine, Ser: 1.24 mg/dL (ref 0.61–1.24)
GFR, Estimated: 56 mL/min — ABNORMAL LOW (ref >60)
Glucose, Bld: 94 mg/dL (ref 70–99)
Potassium: 4.8 mmol/L (ref 3.5–5.1)
Sodium: 129 mmol/L — ABNORMAL LOW (ref 135–145)

## 2023-02-20 LAB — CBC
HCT: 38.4 % — ABNORMAL LOW (ref 39.0–52.0)
Hemoglobin: 12.9 g/dL — ABNORMAL LOW (ref 13.0–17.0)
MCH: 31.9 pg (ref 26.0–34.0)
MCHC: 33.6 g/dL (ref 30.0–36.0)
MCV: 94.8 fL (ref 80.0–100.0)
Platelets: 336 10*3/uL (ref 150–400)
RBC: 4.05 MIL/uL — ABNORMAL LOW (ref 4.22–5.81)
RDW: 12.7 % (ref 11.5–15.5)
WBC: 12.4 10*3/uL — ABNORMAL HIGH (ref 4.0–10.5)
nRBC: 0 % (ref 0.0–0.2)

## 2023-02-20 MED ORDER — ASPIRIN 81 MG PO TBEC
81.0000 mg | DELAYED_RELEASE_TABLET | Freq: Every day | ORAL | Status: DC
  Administered 2023-02-21 – 2023-02-22 (×2): 81 mg via ORAL
  Filled 2023-02-20 (×2): qty 1

## 2023-02-20 MED ORDER — SODIUM CHLORIDE 0.9 % IV SOLN: INTRAVENOUS | Status: AC

## 2023-02-20 MED ORDER — ASPIRIN 300 MG RE SUPP
300.0000 mg | Freq: Every day | RECTAL | Status: DC
  Administered 2023-02-20: 300 mg via RECTAL
  Filled 2023-02-20 (×2): qty 1

## 2023-02-20 NOTE — Plan of Care (Signed)
Problem: Education: Goal: Knowledge of General Education information will improve Description: Including pain rating scale, medication(s)/side effects and non-pharmacologic comfort measures Outcome: Progressing   Problem: Safety: Goal: Ability to remain free from injury will improve Outcome: Progressing   Problem: Skin Integrity: Goal: Risk for impaired skin integrity will decrease Outcome: Progressing

## 2023-02-20 NOTE — Evaluation (Addendum)
Occupational Therapy Evaluation Patient Details Name: Kyle Spence MRN: [id number] DOB: 07-28-1932 Today's Date: 02/20/2023   History of Present Illness Pt is an 89 y.o. male brought to ED with worsening of generalized weakness and confusion. Diagnosed with COVID-19. PMH significant for advanced dementia, chronic ambulation impairment, BPH, frequent recurrent UTIs, HTN, gout, CKD stage II, & peripheral neuropathy   Clinical Impression   Pt received in bed, son Trey Paula in room providing PLOF and home setup history. At baseline, son assists with ADLs and pt can use upright RW to ambulate with son assisting. On OT eval, pt lethargic, unable to speak at this time, and barely opens eyes. Does not initiate purposeful movement, occasionally moans with rolling for repositioning. Son reporting level of alertness has been slowly declining since Tuesday of this week - pt typically talkative and able to engage in self-feeding. Pt requires TOTAL A +2 to roll for repositioning and pericare after small BM. Message sent to RN and MD regarding pt's current status and vital signs. NT in room to assist this author with linen change.   Pt would benefit from skilled OT services to address noted impairments and functional limitations (see below for any additional details) in order to maximize safety and independence while minimizing falls risk and caregiver burden. Will place pt on trial of 3 sessions to see if ability to engage improves. At discharge, pt will need hoyer lift and 24/7 supervision with [facility] OT if son takes pt home vs LTC.        If plan is discharge home, recommend the following: Two people to help with walking and/or transfers;Two people to help with bathing/dressing/bathroom;Assistance with cooking/housework;Assistance with feeding;Direct supervision/assist for medications management;Direct supervision/assist for financial management;Assist for transportation;Help with stairs or ramp for  entrance;Supervision due to cognitive status    Functional Status Assessment  Patient has had a recent decline in their functional status and demonstrates the ability to make significant improvements in function in a reasonable and predictable amount of time.  Equipment Recommendations  Hoyer lift       Precautions / Restrictions Precautions Precautions: Fall Precaution Comments: dementia, non-verbal, lethargic Restrictions Weight Bearing Restrictions Per Provider Order: No      Mobility Bed Mobility Overal bed mobility: Needs Assistance Bed Mobility: Rolling Rolling: Total assist, +2 for physical assistance         General bed mobility comments: TOTAL +2 for rolling for repositioning, pt noted to have BM, NT assists with pericare. Pt does not assist or initiate movement.    Transfers                   General transfer comment: will need hoyer, unsafe to attempt this date      Balance Overall balance assessment: Needs assistance     Sitting balance - Comments: unsafe to attempt OOB mobility with level of arousal                                   ADL either performed or assessed with clinical judgement   ADL Overall ADL's : Needs assistance/impaired                                       General ADL Comments: TOTAL +2 assist. Pt unable to feed self or perform purposeful movements at this time which  is different than baseline per son      Pertinent Vitals/Pain Pain Assessment Pain Assessment: PAINAD Breathing: normal Negative Vocalization: occasional moan/groan, low speech, negative/disapproving quality Facial Expression: facial grimacing Body Language: tense, distressed pacing, fidgeting Consolability: no need to console PAINAD Score: 4 Pain Intervention(s): Limited activity within patient's tolerance, Repositioned, Monitored during session     Extremity/Trunk Assessment Upper Extremity Assessment Upper Extremity  Assessment: Difficult to assess due to impaired cognition;Generalized weakness   Lower Extremity Assessment Lower Extremity Assessment: Difficult to assess due to impaired cognition;Generalized weakness       Communication Communication Communication: Difficulty communicating thoughts/reduced clarity of speech Cueing Techniques: Other (comments);Verbal cues;Tactile cues;Visual cues (pt does not follow any cues. tracks therapist minimally, but only briefly before closing eyes again. will moan when turned to reposition for pericare)   Cognition Arousal: Lethargic Behavior During Therapy: Flat affect Overall Cognitive Status: History of cognitive impairments - at baseline                                 General Comments: son states decline in level of arousal since ~Tues. Pt typically awake, talkative and able to feed self. Yesterday, pt was verbalizing yes occassionaly, but today does not respond verbally. Opens eyes minimally to tactile stimulation.     General Comments  BUE noted to be swollen and warm to touch. Message sent to MD and RN regarding pt's vital signs (NT in room and assisting)  =  Temp 102.4, BP 141/72 (91),  SpO2 95% on RA, HR 95. Educated son on level of alertness impacting pt's ability to safely swallow and eat, recommended discussing with RN/MD for safe intake options as pt does not demo ability at this time.             Home Living Family/patient expects to be discharged to:: Private residence Living Arrangements: Alone Available Help at Discharge: Family Type of Home: House Home Access: Ramped entrance     Home Layout: One level     Bathroom Shower/Tub: Arts development officer Toilet: Handicapped height     Home Equipment: Agricultural consultant (2 wheels);Rollator (4 wheels);Cane - single point;Shower seat;Toilet riser;Grab bars - toilet;Grab bars - tub/shower;Wheelchair - manual;Lift chair;Hospital bed;Other (comment)   Additional Comments: has  another rollator with bilateral UE platforms (upwalker)      Prior Functioning/Environment Prior Level of Function : Needs assist             Mobility Comments: son assists with AMB ~8x daily c upwalker and WC follow; does not walk unassisted ADLs Comments: Son assists with pericare after toileting, LB dressing, meals, supervision for UB dressing, assist for seated shower, assist for all IADL. Pt receives meals on wheels and is typically able to feed himself. Typically mod indep with seated grooming tasks.        OT Problem List: Decreased strength;Decreased range of motion;Decreased activity tolerance;Impaired balance (sitting and/or standing);Decreased cognition;Decreased knowledge of use of DME or AE;Decreased safety awareness      OT Treatment/Interventions: Self-care/ADL training;Therapeutic exercise;Neuromuscular education;Energy conservation;DME and/or AE instruction;Therapeutic activities;Cognitive remediation/compensation;Patient/family education;Balance training    OT Goals(Current goals can be found in the care plan section) Acute Rehab OT Goals OT Goal Formulation: Patient unable to participate in goal setting Time For Goal Achievement: 03/06/23 Potential to Achieve Goals: Fair  OT Frequency: Min 1X/week       AM-PAC OT "6 Clicks" Daily Activity  Outcome Measure Help from another person eating meals?: Total Help from another person taking care of personal grooming?: Total Help from another person toileting, which includes using toliet, bedpan, or urinal?: Total Help from another person bathing (including washing, rinsing, drying)?: Total Help from another person to put on and taking off regular upper body clothing?: Total Help from another person to put on and taking off regular lower body clothing?: Total 6 Click Score: 6   End of Session Nurse Communication: Mobility status;Need for lift equipment;Other (comment) (vital signs)  Activity Tolerance: Patient  limited by lethargy Patient left: in bed;with call bell/phone within reach;with bed alarm set;with nursing/sitter in room;with family/visitor present  OT Visit Diagnosis: Muscle weakness (generalized) (M62.81);Other abnormalities of gait and mobility (R26.89)                Time: 9629-5284 OT Time Calculation (min): 61 min Charges:  OT General Charges $OT Visit: 1 Visit OT Evaluation $OT Eval Low Complexity: 1 Low OT Treatments $Self Care/Home Management : 8-22 mins  Nissi Doffing L. Aidyn Kellis, OTR/L  02/20/23, 10:17 AM

## 2023-02-20 NOTE — Progress Notes (Signed)
Spoke with pt's son Trey Paula at [phone].  Discussed the MOON letter with Trey Paula and he verbally acknowledged and consented to the information given.

## 2023-02-20 NOTE — Progress Notes (Signed)
[facility]  - [place] at [facility]   PATIENT NAME: Kyle Spence    MR#:  [id number]  DATE OF BIRTH:  10-26-1932  SUBJECTIVE:  patient son at bedside. Patient unable to give any history review system. He had fever of 102 earlier and resting. Came in with generalized weakness, fever chills and was found to have COVID positive as outpatient. Poor PO intake for last couple days. Had fall at home due to weakness per son.    VITALS:  Blood pressure (!) 141/72, pulse 95, temperature (!) 102.4 F (39.1 C), resp. rate 20, height 5\' 10"  (1.778 m), weight 91.9 kg, SpO2 95%.  PHYSICAL EXAMINATION:  did exam GENERAL:  89 y.o.-year-old patient with no acute distress.  LUNGS: Normal breath sounds bilaterally, no wheezing CARDIOVASCULAR: S1, S2 normal. No murmur   ABDOMEN: Soft, nontender, nondistended. Bowel sounds present.  EXTREMITIES: No  edema b/l. Puffy upper extremities NEUROLOGIC: unable to assess but grossly appears intact    LABORATORY PANEL:  CBC Recent Labs  Lab 02/20/23 0436  WBC 12.4*  HGB 12.9*  HCT 38.4*  PLT 336    Chemistries  Recent Labs  Lab 02/18/23 1645 02/20/23 0436  NA 130* 129*  K 3.8 4.8  CL 95* 94*  CO2 25 20*  GLUCOSE 89 94  BUN 28* 25*  CREATININE 0.90 1.24  CALCIUM 8.6* 8.7*  AST 42*  --   ALT 50*  --   ALKPHOS 102  --   BILITOT 0.8  --    Cardiac Enzymes No results for input(s): "TROPONINI" in the last 168 hours. RADIOLOGY:  CT HEAD WO CONTRAST ( ) Result Date: 02/18/2023 CLINICAL DATA:  Hypertensive, altered mental status. Evaluate for intracranial hemorrhage. Diagnosed with COVID four days ago with failure to thrive since then, loss of ability to speak. EXAM: CT HEAD WITHOUT CONTRAST TECHNIQUE: Contiguous axial images were obtained from the base of the skull through the vertex without intravenous contrast. RADIATION DOSE REDUCTION: This exam was performed according to the departmental dose-optimization program which  includes automated exposure control, adjustment of the mA and/or kV according to patient size and/or use of iterative reconstruction technique. COMPARISON:  Head CT 10/18/2022, brain MRI 10/19/2022. FINDINGS: Brain: There is moderately advanced global atrophy, with atrophic ventriculomegaly and moderate to severe small vessel disease of the cerebral white matter. There are tiny chronic bilateral lacunar infarcts in the gangliocapsular areas. No cortical based acute infarct, hemorrhage, mass or mass effect is seen. No midline shift. Basal cisterns are clear. Vascular: Moderate to heavy calcifications noted both distal vertebral arteries, both siphons. No hyperdense central vessel is seen. Skull: There is a small scalp lipoma right frontal area. No skull fractures or focal skull lesions are seen. Sinuses/Orbits: There is scattered fluid in the lower medial right mastoid air cells, seen previously. The mastoid air cells are otherwise clear. Paranasal sinuses are clear. Midline nasal septum. Old lens extractions with otherwise negative orbits. Other: None. IMPRESSION: 1. No acute intracranial CT findings or interval changes. 2. Atrophy and small-vessel disease. 3. Right mastoid effusion, unchanged. Electronically Signed   By: Almira Bar M.D.   On: 02/18/2023 22:57   DG Chest Port 1 View Result Date: 02/18/2023 CLINICAL DATA:  Sepsis EXAM: PORTABLE CHEST 1 VIEW COMPARISON:  10/18/2022 FINDINGS: Single frontal view of the chest demonstrates a stable cardiac silhouette. No airspace disease, effusion, or pneumothorax. There is an acute to subacute left lateral fifth rib fracture. No other acute bony abnormalities. IMPRESSION: 1. Acute  to subacute left lateral fifth rib fracture. This is not visible on the comparison exam. 2. No acute intrathoracic process. Electronically Signed   By: Sharlet Salina M.D.   On: 02/18/2023 15:36    Assessment and Plan Kyle Spence is a 89 y.o. male with medical history  significant of advanced dementia, chronic ambulation impairment, BPH, frequent recurrent UTIs, HTN, gout, CKD stage II, peripheral neuropathy, brought in by family member for evaluation of worsening of generalized weakness and confusion.   Family reported that last Friday patient started to have nasal congestion and cough and generalized weakness and appeared to be more confused. Saturday, son took him to urgent care, COVID screening came back positive.   And chronically patient has urinary incontinence and has had 3 UTIs since October last year. Patient did sustain of fall at home few days ago due to weakness.  CT head: There is moderately advanced global atrophy, with atrophic ventriculomegaly and moderate to severe small vessel disease of the cerebral white matter.  There are tiny chronic bilateral lacunar infarcts in the gangliocapsular areas.  Acute metabolic encephalopathy febrile illness -- suspected due to COVID and UTI -CT head reassuring -- PRN Tylenol, symptomatic treatment for viral illness -- urine culture so far negative. Will complete three doses of IV Rocephin -- if mentation does not show improvement consider MRI of the brain   Recurrent UTI -Continue ceftriaxone -Outpatient urology follow-up for urodynamic study-- patient already has appointment per son   HTN, uncontrolled -Change verapamil to amlodipine   Acute on chronic ambulation impairment -PT evaluation   CKD stage II -Euvolemic and creatinine level stable   Advanced dementia -Patient is DNR/DNI   DVT prophylaxis: Heparin subcu Code Status: DNR     Family communication :Son at bedside Consults : CODE STATUS:  DVT Prophylaxis : Level of care: Telemetry Medical Status is: Inpatient Remains inpatient appropriate because: Febrile illness    TOTAL TIME TAKING CARE OF THIS PATIENT: 40 minutes.  >50% time spent on counselling and coordination of care  Note: This dictation was prepared with Dragon  dictation along with smaller phrase technology. Any transcriptional errors that result from this process are unintentional.  Enedina Finner M.D    [facility]   CC: Primary care physician; Marisue Ivan, MD

## 2023-02-21 ENCOUNTER — Inpatient Hospital Stay: Payer: Medicare Other

## 2023-02-21 DIAGNOSIS — R531 Weakness: Secondary | ICD-10-CM | POA: Diagnosis not present

## 2023-02-21 LAB — BASIC METABOLIC PANEL
Anion gap: 13 (ref 5–15)
BUN: 32 mg/dL — ABNORMAL HIGH (ref 8–23)
CO2: 20 mmol/L — ABNORMAL LOW (ref 22–32)
Calcium: 8.2 mg/dL — ABNORMAL LOW (ref 8.9–10.3)
Chloride: 97 mmol/L — ABNORMAL LOW (ref 98–111)
Creatinine, Ser: 1.12 mg/dL (ref 0.61–1.24)
GFR, Estimated: >60 mL/min (ref >60)
Glucose, Bld: 157 mg/dL — ABNORMAL HIGH (ref 70–99)
Potassium: 3.7 mmol/L (ref 3.5–5.1)
Sodium: 130 mmol/L — ABNORMAL LOW (ref 135–145)

## 2023-02-21 LAB — URINE CULTURE

## 2023-02-21 MED ORDER — SODIUM CHLORIDE 0.9 % IV SOLN: INTRAVENOUS | Status: AC

## 2023-02-21 NOTE — Plan of Care (Signed)

## 2023-02-21 NOTE — Progress Notes (Signed)
[facility]  - [place] [facility]   PATIENT NAME: Kyle Spence    MR#:  [id number]  DATE OF BIRTH:  [birth year]  SUBJECTIVE:  patient son at bedside. More awake today. Did eat better according to the sun but had to be fed. Son tells me on regular days he is normal and communicating well. Answered couple basic questions. Recognizing common objects in the room. Per son nowhere near his baseline.  VITALS:  Blood pressure (!) 153/79, pulse 85, temperature 99.5 F (37.5 C), temperature source Oral, resp. rate 18, height 5\' 10"  (1.778 m), weight 91.9 kg, SpO2 95%.  PHYSICAL EXAMINATION:  did exam GENERAL:  89 y.o.-year-old patient with no acute distress.  LUNGS: Normal breath sounds bilaterally, no wheezing CARDIOVASCULAR: S1, S2 normal. No murmur   ABDOMEN: Soft, nontender, nondistended.  EXTREMITIES: No  edema b/l. Puffy upper extremities NEUROLOGIC: unable to assess but grossly appears intact more awake however not oriented to time place. Recognizing some common objects.   LABORATORY PANEL:  CBC Recent Labs  Lab 02/20/23 0436  WBC 12.4*  HGB 12.9*  HCT 38.4*  PLT 336    Chemistries  Recent Labs  Lab 02/18/23 1645 02/20/23 0436 02/21/23 1054  NA 130*   < > 130*  K 3.8   < > 3.7  CL 95*   < > 97*  CO2 25   < > 20*  GLUCOSE 89   < > 157*  BUN 28*   < > 32*  CREATININE 0.90   < > 1.12  CALCIUM 8.6*   < > 8.2*  AST 42*  --   --   ALT 50*  --   --   ALKPHOS 102  --   --   BILITOT 0.8  --   --    < > = values in this interval not displayed.   Cardiac Enzymes No results for input(s): "TROPONINI" in the last 168 hours. RADIOLOGY:  No results found.   Assessment and Plan ALLIN FRIX is a 89 y.o. male with medical history significant of advanced dementia, chronic ambulation impairment, BPH, frequent recurrent UTIs, HTN, gout, CKD stage II, peripheral neuropathy, brought in by family member for evaluation of worsening of generalized weakness and  confusion.   Family reported that last Friday patient started to have nasal congestion and cough and generalized weakness and appeared to be more confused. Saturday, son took him to urgent care, COVID screening came back positive.   And chronically patient has urinary incontinence and has had 3 UTIs since October last year. Patient did sustain of fall at home few days ago due to weakness.  CT head: There is moderately advanced global atrophy, with atrophic ventriculomegaly and moderate to severe small vessel disease of the cerebral white matter.  There are tiny chronic bilateral lacunar infarcts in the gangliocapsular areas.  Acute metabolic encephalopathy febrile illness -- suspected due to COVID and UTI -CT head reassuring -- PRN Tylenol, symptomatic treatment for viral illness -- urine culture so far negative. Will complete three doses of IV Rocephin -- if mentation does not show improvement consider MRI of the brain --1/25-- patient more awake. Still not verbalizing much. Recognizing some common objects in the room like banana, spoon, reading [facility], recognizing son. -- No focal weakness however will get MRI of the brain.   Recurrent UTI -Continue ceftriaxone x 3 doses -Outpatient urology follow-up for urodynamic study-- patient already has appointment per son --UC neg   HTN,  uncontrolled - amlodipine   Acute on chronic ambulation impairment -PT evaluation   CKD stage II -Euvolemic and creatinine level stable   Advanced dementia -Patient is DNR/DNI   DVT prophylaxis: Heparin subcu Code Status: DNR     Family communication :Son at bedside Consults :none CODE STATUS: DNR DVT Prophylaxis :Heparin Level of care: Telemetry Medical Status is: Inpatient Remains inpatient appropriate because: AMS, covid, needs PT/OT eval    TOTAL TIME TAKING CARE OF THIS PATIENT: 40 minutes.  >50% time spent on counselling and coordination of care  Note: This dictation  was prepared with Dragon dictation along with smaller phrase technology. Any transcriptional errors that result from this process are unintentional.  Enedina Finner M.D    [facility]   CC: Primary care physician; Marisue Ivan, MD

## 2023-02-22 DIAGNOSIS — R531 Weakness: Secondary | ICD-10-CM | POA: Diagnosis not present

## 2023-02-22 LAB — BASIC METABOLIC PANEL
Anion gap: 10 (ref 5–15)
BUN: 25 mg/dL — ABNORMAL HIGH (ref 8–23)
CO2: 22 mmol/L (ref 22–32)
Calcium: 8.3 mg/dL — ABNORMAL LOW (ref 8.9–10.3)
Chloride: 99 mmol/L (ref 98–111)
Creatinine, Ser: 0.94 mg/dL (ref 0.61–1.24)
GFR, Estimated: >60 mL/min (ref >60)
Glucose, Bld: 95 mg/dL (ref 70–99)
Potassium: 3.8 mmol/L (ref 3.5–5.1)
Sodium: 131 mmol/L — ABNORMAL LOW (ref 135–145)

## 2023-02-22 MED ORDER — NITROFURANTOIN MONOHYD MACRO 100 MG PO CAPS
100.0000 mg | ORAL_CAPSULE | Freq: Two times a day (BID) | ORAL | Status: DC
  Administered 2023-02-22 (×2): 100 mg via ORAL
  Filled 2023-02-22 (×3): qty 1

## 2023-02-22 NOTE — TOC Progression Note (Signed)
Transition of Care [facility]) - Progression Note    Patient Details  Name: Kyle Spence MRN: [id number] Date of Birth: April 16, 1932  Transition of Care [facility]) CM/SW Contact  Maree Krabbe, LCSW Phone Number: 02/22/2023, 2:34 PM  Clinical Narrative:   Cindie Laroche will service pt. PT and OT. MD and pt's son notify. Start of care will be Tuesday.     Expected Discharge Plan: Home w Home Health Services Barriers to Discharge: Continued Medical Work up  Expected Discharge Plan and Services In-house Referral: Clinical Social Work   Post Acute Care Choice: Home Health Living arrangements for the past 2 months: Single Family Home                 DME Arranged:  (hoyar lift) DME Agency: [employer] Date DME Agency Contacted: 02/22/23 Time DME Agency Contacted: 1423   HH Arranged: PT, OT HH Agency:  [employer]film[employer]) Date HH Agency Contacted: 02/22/23 Time HH Agency Contacted: 1425 Representative spoke with at [facility] Agency: Sarah   Social Determinants of Health (SDOH) Interventions SDOH Screenings   Food Insecurity: No Food Insecurity (02/19/2023)  Housing: Low Risk  (02/19/2023)  Transportation Needs: No Transportation Needs (02/19/2023)  Utilities: Not At Risk (02/19/2023)  Financial Resource Strain: Low Risk  (12/03/2022)   Received from [facility]  Social Connections: Moderately Isolated (02/19/2023)  Tobacco Use: Low Risk  (02/19/2023)    Readmission Risk Interventions    10/21/2022   10:31 AM  Readmission Risk Prevention Plan  Transportation Screening Complete  PCP or Specialist Appt within 5-7 Days Complete  Home Care Screening Complete

## 2023-02-22 NOTE — TOC Initial Note (Signed)
Transition of Care [facility]) - Initial/Assessment Note    Patient Details  Name: Kyle Spence MRN: [id number] Date of Birth: March 06, 1932  Transition of Care [facility]) CM/SW Contact:    Maree Krabbe, LCSW Phone Number: 02/22/2023, 2:26 PM  Clinical Narrative:   SW reached pt's son. Pt's son ask that SW try to get [facility] set up for [facility] for pt. Pt's son also agreeable to hoyar lift. SW order hoyar lift through [employer]. [employer] will reach out to pt's son for delivery date and time. Per pt's son pt will need ems home- PT also indicated that pt will need ems transport home.  SW reached out to [facility] with referral for PT, OT and Aid- awaiting decision.                Expected Discharge Plan: Home w Home Health Services Barriers to Discharge: Continued Medical Work up   Patient Goals and CMS Choice     Choice offered to / list presented to : Adult Children      Expected Discharge Plan and Services In-house Referral: Clinical Social Work   Post Acute Care Choice: Home Health Living arrangements for the past 2 months: Single Family Home                 DME Arranged:  (hoyar lift) DME Agency: [employer] Date DME Agency Contacted: 02/22/23 Time DME Agency Contacted: 1423   HH Arranged: PT, OT HH Agency:  [employer] television[employer]film[employer]video) Date HH Agency Contacted: 02/22/23 Time HH Agency Contacted: 1425 Representative spoke with at [facility] Agency: Sarah  Prior Living Arrangements/Services Living arrangements for the past 2 months: Single Family Home Lives with:: Adult Children Patient language and need for interpreter reviewed:: Yes Do you feel safe going back to the place where you live?: Yes      Need for Family Participation in Patient Care: Yes (Comment) Care giver support system in place?: Yes (comment) Current home services: DME Criminal Activity/Legal Involvement Pertinent to Current Situation/Hospitalization: No - Comment as needed  Activities of Daily Living   ADL Screening (condition at time of  admission) Independently performs ADLs?: No Does the patient have a NEW difficulty with bathing/dressing/toileting/self-feeding that is expected to last >3 days?: Yes (Initiates electronic notice to provider for possible OT consult) Does the patient have a NEW difficulty with getting in/out of bed, walking, or climbing stairs that is expected to last >3 days?: Yes (Initiates electronic notice to provider for possible PT consult) Does the patient have a NEW difficulty with communication that is expected to last >3 days?: No Is the patient deaf or have difficulty hearing?: No Does the patient have difficulty seeing, even when wearing glasses/contacts?: No Does the patient have difficulty concentrating, remembering, or making decisions?: No  Permission Sought/Granted Permission sought to share information with : Family Supports Permission granted to share information with : Yes, Release of Information Signed  Share Information with NAME: jeff  Permission granted to share info w AGENCY: [facility]  Permission granted to share info w Relationship: son  Permission granted to share info w Contact Information: [phone]  Emotional Assessment Appearance:: Appears stated age Attitude/Demeanor/Rapport: Unable to Assess Affect (typically observed): Unable to Assess Orientation: :  (disoriented x4)   Psych Involvement: No (comment)  Admission diagnosis:  Dehydration [E86.0] Lower urinary tract infectious disease [N39.0] Encephalopathy [G93.40] Generalized weakness [R53.1] Closed fracture of one rib of left side, initial encounter [S22.32XA] Altered mental status, unspecified altered mental status type [R41.82] Patient Active Problem List  Diagnosis Date Noted   Generalized weakness 02/19/2023   COVID-19 virus infection 02/19/2023   UTI (urinary tract infection) 02/19/2023   Encephalopathy 02/19/2023   Hyponatremia 10/19/2022   Acute metabolic encephalopathy 10/19/2022   Gout 10/19/2022    Nausea vomiting and diarrhea 10/19/2022   Obesity (BMI 30-39.9) 10/19/2022   BPH (benign prostatic hyperplasia) 10/19/2022   Acute cystitis without hematuria    Mild dementia without behavioral disturbance, psychotic disturbance, mood disturbance, or anxiety (HCC)    Hip fracture (HCC) 12/17/2020   Pre-op evaluation    Impaired fasting glucose    Fracture of femoral neck, left (HCC) 12/16/2020   Stage 3b chronic kidney disease (CKD) (HCC) 12/16/2020   Leukocytosis 12/16/2020   Peripheral artery disease (HCC) 05/13/2016   Swelling of limb 03/11/2016   Pain in limb 03/11/2016   Borderline diabetes mellitus 02/07/2015   Chronic constipation 02/07/2015   Chronic renal failure 02/07/2015   Essential (primary) hypertension 02/07/2015   Edema leg 02/07/2015   Hyperlipidemia 02/07/2015   Adiposity 02/07/2015   Arthritis, degenerative 02/07/2015   Detrusor muscle hypertonia 02/07/2015   Peripheral nerve disease 02/07/2015   H/O: gout 04/26/2014   PCP:  Marisue Ivan, MD Pharmacy:   [facility] [phone] [place], [place] - [address] [place] [address] Phone: [phone] Fax: [phone]     Social Drivers of Health (SDOH) Social History: SDOH Screenings   Food Insecurity: No Food Insecurity (02/19/2023)  Housing: Low Risk  (02/19/2023)  Transportation Needs: No Transportation Needs (02/19/2023)  Utilities: Not At Risk (02/19/2023)  Financial Resource Strain: Low Risk  (12/03/2022)   Received from [facility]  Social Connections: Moderately Isolated (02/19/2023)  Tobacco Use: Low Risk  (02/19/2023)   SDOH Interventions:     Readmission Risk Interventions    10/21/2022   10:31 AM  Readmission Risk Prevention Plan  Transportation Screening Complete  PCP or Specialist Appt within 5-7 Days Complete  Home Care Screening Complete

## 2023-02-22 NOTE — Plan of Care (Signed)
  Problem: Education: Goal: Knowledge of General Education information will improve Description: Including pain rating scale, medication(s)/side effects and non-pharmacologic comfort measures 02/22/2023 0249 by Dorthula Nettles, RN Outcome: Not Progressing 02/22/2023 0235 by Dorthula Nettles, RN Outcome: Progressing   Problem: Health Behavior/Discharge Planning: Goal: Ability to manage health-related needs will improve 02/22/2023 0249 by Dorthula Nettles, RN Outcome: Not Progressing 02/22/2023 0235 by Dorthula Nettles, RN Outcome: Progressing   Problem: Clinical Measurements: Goal: Ability to maintain clinical measurements within normal limits will improve 02/22/2023 0249 by Dorthula Nettles, RN Outcome: Not Progressing 02/22/2023 0235 by Dorthula Nettles, RN Outcome: Progressing Goal: Will remain free from infection 02/22/2023 0249 by Dorthula Nettles, RN Outcome: Not Progressing 02/22/2023 0235 by Dorthula Nettles, RN Outcome: Progressing Goal: Diagnostic test results will improve 02/22/2023 0249 by Dorthula Nettles, RN Outcome: Not Progressing 02/22/2023 0235 by Dorthula Nettles, RN Outcome: Progressing Goal: Respiratory complications will improve 02/22/2023 0249 by Dorthula Nettles, RN Outcome: Not Progressing 02/22/2023 0235 by Dorthula Nettles, RN Outcome: Progressing Goal: Cardiovascular complication will be avoided 02/22/2023 0249 by Dorthula Nettles, RN Outcome: Not Progressing 02/22/2023 0235 by Dorthula Nettles, RN Outcome: Progressing   Problem: Activity: Goal: Risk for activity intolerance will decrease 02/22/2023 0249 by Dorthula Nettles, RN Outcome: Not Progressing 02/22/2023 0235 by Dorthula Nettles, RN Outcome: Progressing   Problem: Nutrition: Goal: Adequate nutrition will be maintained 02/22/2023 0249 by Dorthula Nettles, RN Outcome: Not Progressing 02/22/2023 0235 by Dorthula Nettles, RN Outcome: Progressing   Problem: Coping: Goal: Level of anxiety will decrease 02/22/2023 0249 by Dorthula Nettles, RN Outcome:  Not Progressing 02/22/2023 0235 by Dorthula Nettles, RN Outcome: Progressing   Problem: Elimination: Goal: Will not experience complications related to bowel motility 02/22/2023 0249 by Dorthula Nettles, RN Outcome: Not Progressing 02/22/2023 0235 by Dorthula Nettles, RN Outcome: Progressing Goal: Will not experience complications related to urinary retention 02/22/2023 0249 by Dorthula Nettles, RN Outcome: Not Progressing 02/22/2023 0235 by Dorthula Nettles, RN Outcome: Progressing   Problem: Pain Managment: Goal: General experience of comfort will improve and/or be controlled 02/22/2023 0249 by Dorthula Nettles, RN Outcome: Not Progressing 02/22/2023 0235 by Dorthula Nettles, RN Outcome: Progressing   Problem: Safety: Goal: Ability to remain free from injury will improve 02/22/2023 0249 by Dorthula Nettles, RN Outcome: Not Progressing 02/22/2023 0235 by Dorthula Nettles, RN Outcome: Progressing   Problem: Skin Integrity: Goal: Risk for impaired skin integrity will decrease 02/22/2023 0249 by Dorthula Nettles, RN Outcome: Not Progressing 02/22/2023 0235 by Dorthula Nettles, RN Outcome: Progressing

## 2023-02-22 NOTE — Progress Notes (Addendum)
Physical Therapy Treatment Patient Details Name: Kyle Spence MRN: [id number] DOB: 1932-12-05 Today's Date: 02/22/2023   History of Present Illness Pt is an 89 y.o. male brought to ED with worsening of generalized weakness and confusion. Diagnosed with COVID-19. PMH significant for advanced dementia, chronic ambulation impairment, BPH, frequent recurrent UTIs, HTN, gout, CKD stage II, & peripheral neuropathy    PT Comments  Supportive son at the bedside. Patient is awake, alert, with mostly nonsensical talking. He has anxiety and mild agitation initially with mobility and needs reassurance and increased time with all activity. Total assistance +2 person for bed mobility. Patient tolerated sitting on edge of bed for ~ 25 minutes with intermittent +2 person assistance secondary to posterior lean. Patient was able to stand x 2 bouts with Max A +2 person and come to partial standing position. Consider EMS transportation home due to assistance required to sit upright. PT will continue to follow to maximize independence and decrease caregiver burden.    If plan is discharge home, recommend the following: Two people to help with bathing/dressing/bathroom;Two people to help with walking and/or transfers;Help with stairs or ramp for entrance;Assistance with feeding;Assistance with cooking/housework;Assist for transportation;Direct supervision/assist for medications management;Supervision due to cognitive status   Can travel by private vehicle      No  Equipment Recommendations  Hoyer lift    Recommendations for Other Services       Precautions / Restrictions Precautions Precautions: Fall Precaution Comments: resistive at times with mobility efforts Restrictions Weight Bearing Restrictions Per Provider Order: No     Mobility  Bed Mobility Overal bed mobility: Needs Assistance Bed Mobility: Supine to Sit, Sit to Supine     Supine to sit: Total assist, +2 for physical assistance Sit to  supine: Total assist, +2 for physical assistance   General bed mobility comments: significant assistance required with all mobility efforts. patient needs maximal encouragement and is resistive initially with sitting upright. son present and helpful during session    Transfers Overall transfer level: Needs assistance Equipment used: 2 person hand held assist Transfers: Sit to/from Stand Sit to Stand: Max assist, +2 physical assistance           General transfer comment: 2 standing bouts performed where buttocks fully cleared the bed. patient has difficulty standing fully upright and has a flexed posture despite cues and facilitation for midline. patient will need a hoyer lift for routine mobility with EMS transporation recommended when discharged for safety.    Ambulation/Gait               General Gait Details: unable to attempt due to poor standing tolerance and generalized weakness   Stairs             Wheelchair Mobility     Tilt Bed    Modified Rankin (Stroke Patients Only)       Balance Overall balance assessment: Needs assistance Sitting-balance support: Feet supported Sitting balance-Leahy Scale: Zero Sitting balance - Comments: intermittent +2 person assistance. facilitation for anterior weight shifting. sitting time of around 20 minutes   Standing balance support: Bilateral upper extremity supported Standing balance-Leahy Scale: Zero Standing balance comment: unable to achieve full standing position                            Cognition Arousal: Alert Behavior During Therapy: Agitated, Anxious (with mobility efforts) Overall Cognitive Status: History of cognitive impairments - at baseline  General Comments: Son reports patient is different than normal. He is currently more confused and has difficulty following commands. Needs reassurance with high anxiety/ mildly agitated initially with  mobility        Exercises      General Comments        Pertinent Vitals/Pain Pain Assessment Pain Assessment: PAINAD Breathing: normal Negative Vocalization: occasional moan/groan, low speech, negative/disapproving quality Facial Expression: facial grimacing Body Language: tense, distressed pacing, fidgeting Consolability: distracted or reassured by voice/touch PAINAD Score: 5 Pain Intervention(s): Repositioned, Monitored during session, Limited activity within patient's tolerance    Home Living                          Prior Function            PT Goals (current goals can now be found in the care plan section) Acute Rehab PT Goals Patient Stated Goal: son's goal is to bring patient home when ready PT Goal Formulation: With family Time For Goal Achievement: 03/05/23 Potential to Achieve Goals: Fair Progress towards PT goals: Progressing toward goals    Frequency    Min 1X/week      PT Plan      Co-evaluation              AM-PAC PT "6 Clicks" Mobility   Outcome Measure  Help needed turning from your back to your side while in a flat bed without using bedrails?: Total Help needed moving from lying on your back to sitting on the side of a flat bed without using bedrails?: Total Help needed moving to and from a bed to a chair (including a wheelchair)?: Total Help needed standing up from a chair using your arms (e.g., wheelchair or bedside chair)?: Total Help needed to walk in hospital room?: Total Help needed climbing 3-5 steps with a railing? : Total 6 Click Score: 6    End of Session   Activity Tolerance: Patient limited by fatigue Patient left: in bed;with call bell/phone within reach;with family/visitor present   PT Visit Diagnosis: Unsteadiness on feet (R26.81);Other abnormalities of gait and mobility (R26.89);Muscle weakness (generalized) (M62.81);Other symptoms and signs involving the nervous system (R29.898);Difficulty in walking,  not elsewhere classified (R26.2)     Time: 3016-0109 PT Time Calculation (min) (ACUTE ONLY): 53 min  Charges:    $Therapeutic Activity: 53-67 mins PT General Charges $$ ACUTE PT VISIT: 1 Visit                    Donna Bernard, PT, MPT    [facility] 02/22/2023, 12:46 PM

## 2023-02-22 NOTE — Progress Notes (Signed)
[facility]  - [place] at [facility]   PATIENT NAME: Kyle Spence    MR#:  [id number]  DATE OF BIRTH:  [birth year]  SUBJECTIVE:  patient son at bedside. More awake today.. Patient feeding breakfast by himself. Still not having flown conversation however improving day by day. No fever. VITALS:  Blood pressure (!) 171/78, pulse 76, temperature 98.1 F (36.7 C), resp. rate 16, height 5\' 10"  (1.778 m), weight 91.9 kg, SpO2 96%.  PHYSICAL EXAMINATION:  did exam GENERAL:  89 y.o.-year-old patient with no acute distress.  LUNGS: Normal breath sounds bilaterally, no wheezing CARDIOVASCULAR: S1, S2 normal. No murmur   ABDOMEN: Soft, nontender, nondistended.  EXTREMITIES: No  edema b/l. Puffy upper extremities NEUROLOGIC: unable to assess but grossly appears intact more awake however not oriented to time place. Recognizing some common objects.   LABORATORY PANEL:  CBC Recent Labs  Lab 02/20/23 0436  WBC 12.4*  HGB 12.9*  HCT 38.4*  PLT 336    Chemistries  Recent Labs  Lab 02/18/23 1645 02/20/23 0436 02/22/23 1004  NA 130*   < > 131*  K 3.8   < > 3.8  CL 95*   < > 99  CO2 25   < > 22  GLUCOSE 89   < > 95  BUN 28*   < > 25*  CREATININE 0.90   < > 0.94  CALCIUM 8.6*   < > 8.3*  AST 42*  --   --   ALT 50*  --   --   ALKPHOS 102  --   --   BILITOT 0.8  --   --    < > = values in this interval not displayed.   Cardiac Enzymes No results for input(s): "TROPONINI" in the last 168 hours. RADIOLOGY:  MR BRAIN WO CONTRAST Result Date: 02/22/2023 CLINICAL DATA:  Initial evaluation for mental status change, unknown cause. EXAM: MRI HEAD WITHOUT CONTRAST TECHNIQUE: Multiplanar, multiecho pulse sequences of the brain and surrounding structures were obtained without intravenous contrast. COMPARISON:  Prior CT from 02/18/2023 and MRI from 10/19/2022 FINDINGS: Brain: Diffuse prominence of the CSF containing spaces, compatible with atrophy, most pronounced about the  frontal and temporal lobes bilaterally. Patchy and confluent T2/FLAIR hyperintensity involving the periventricular white matter, most characteristic of chronic microvascular ischemic disease, mild for age. No evidence for acute or subacute infarct. Gray-white matter differentiation maintained. No acute or chronic intracranial blood products. No mass lesion, mass effect or midline shift. Prominent lateral and third ventriculomegaly, with comparatively normal caliber of the fourth ventricle. Crowding of the cortical sulci at the vertex. Findings can be seen in the setting of NPH. No extra-axial fluid collection. Pituitary gland suprasellar region within normal limits. Vascular: Major intracranial vascular flow voids are maintained. Skull and upper cervical spine: Cranial junction with normal limits. Bone marrow signal intensity normal. Small lipoma noted at the right frontal scalp. Sinuses/Orbits: Prior bilateral ocular lens replacement. Paranasal sinuses are largely clear. No significant mastoid effusion. Other: None. IMPRESSION: 1. No acute intracranial abnormality. 2. Cerebral atrophy, most pronounced about the frontal and temporal regions bilaterally. Underlying mild chronic microvascular ischemic disease. 3. Prominent lateral and third ventriculomegaly, with crowding of the cortical sulci at the vertex. Findings can be seen in the setting of NPH. Electronically Signed   By: Rise Mu M.D.   On: 02/22/2023 01:32     Assessment and Plan Kyle Spence is a 89 y.o. male with medical history significant of advanced dementia,  chronic ambulation impairment, BPH, frequent recurrent UTIs, HTN, gout, CKD stage II, peripheral neuropathy, brought in by family member for evaluation of worsening of generalized weakness and confusion.   Family reported that last Friday patient started to have nasal congestion and cough and generalized weakness and appeared to be more confused. Saturday, son took him to  urgent care, COVID screening came back positive.   And chronically patient has urinary incontinence and has had 3 UTIs since October last year. Patient did sustain of fall at home few days ago due to weakness.  CT head: There is moderately advanced global atrophy, with atrophic ventriculomegaly and moderate to severe small vessel disease of the cerebral white matter.  There are tiny chronic bilateral lacunar infarcts in the gangliocapsular areas.  Acute metabolic encephalopathy febrile illness Hyponatremia -- suspected due to COVID and UTI -CT head reassuring --Na 129--IVF--130--131 -- PRN Tylenol, symptomatic treatment for viral illness -- urine culture so far negative. Will complete three doses of IV Rocephin -- if mentation does not show improvement consider MRI of the brain --1/25-- patient more awake. Still not verbalizing much. Recognizing some common objects in the room like banana, spoon, reading [facility], recognizing son. -- No focal weakness however will get MRI of the brain. --1/26-- MRI no stroke. Shows brain atrophy. Reviewed with neurology. No fever today. More awake and feeding himself.   Recurrent UTI -Outpatient urology follow-up for urodynamic study-- patient already has appointment per son --UC E faecalis-- rx with NFT for 5 days  HTN, uncontrolled - amlodipine   Acute on chronic ambulation impairment -PT evaluation--HHPT. Son prefers taking pt home. TOC for d/c planning   CKD stage II - creatinine level stable   Advanced dementia -Patient is DNR/DNI   DVT prophylaxis: Heparin subcut Code Status: DNR   if remains stable patient will discharge to home.  Family communication :Son at bedside Consults :none CODE STATUS: DNR DVT Prophylaxis :Heparin Level of care: Telemetry Medical Status is: Inpatient Remains inpatient appropriate because: continue to monitor for one more day per family request discharge home tomorrow.    TOTAL TIME TAKING  CARE OF THIS PATIENT: .  >50% time spent on counselling and coordination of care  Note: This dictation was prepared with Dragon dictation along with smaller phrase technology. Any transcriptional errors that result from this process are unintentional.  Enedina Finner M.D    [facility]   CC: Primary care physician; Marisue Ivan, MD

## 2023-02-23 DIAGNOSIS — R531 Weakness: Secondary | ICD-10-CM | POA: Diagnosis not present

## 2023-02-23 LAB — GLUCOSE, CAPILLARY: Glucose-Capillary: 183 mg/dL — ABNORMAL HIGH (ref 70–99)

## 2023-02-23 LAB — CULTURE, BLOOD (ROUTINE X 2)
Culture: NO GROWTH
Culture: NO GROWTH
Special Requests: ADEQUATE

## 2023-02-23 MED ORDER — FUROSEMIDE 20 MG PO TABS: 20.0000 mg | ORAL_TABLET | Freq: Every day | ORAL | Status: AC | PRN

## 2023-02-23 MED ORDER — NITROFURANTOIN MONOHYD MACRO 100 MG PO CAPS: 100.0000 mg | ORAL_CAPSULE | Freq: Two times a day (BID) | ORAL | 0 refills | Status: AC

## 2023-02-23 MED ORDER — GUAIFENESIN-DM 100-10 MG/5ML PO SYRP: 5.0000 mL | ORAL_SOLUTION | ORAL | 0 refills | Status: AC | PRN

## 2023-02-23 MED ORDER — AMLODIPINE BESYLATE 10 MG PO TABS: 10.0000 mg | ORAL_TABLET | Freq: Every day | ORAL | Status: DC

## 2023-02-23 MED ORDER — AMLODIPINE BESYLATE 10 MG PO TABS: 10.0000 mg | ORAL_TABLET | Freq: Every day | ORAL | 1 refills | Status: AC

## 2023-02-23 NOTE — Progress Notes (Signed)
Patient is alert to self. Did answer some yes and no questions appropriately which is an improvement. Most other speech is incomprehensible or rambling. He becomes fixated on something and repeats it. Last night he was fixated on saying "bow tie". Noticed that he is having polyuria and I asked the son at bedside if this was normal. Son stated that patient drank over a liter of fluids throughout the night. Son stated that patient kept wanting to drink which is probably another fixation he had last night. Additional needs denied.

## 2023-02-23 NOTE — Plan of Care (Signed)
  Problem: Education: Goal: Knowledge of General Education information will improve Description: Including pain rating scale, medication(s)/side effects and non-pharmacologic comfort measures 02/23/2023 1248 by Faustino Congress, RN Outcome: Adequate for Discharge 02/23/2023 0734 by Faustino Congress, RN Outcome: Progressing   Problem: Health Behavior/Discharge Planning: Goal: Ability to manage health-related needs will improve 02/23/2023 1248 by Faustino Congress, RN Outcome: Adequate for Discharge 02/23/2023 0734 by Faustino Congress, RN Outcome: Progressing   Problem: Clinical Measurements: Goal: Ability to maintain clinical measurements within normal limits will improve 02/23/2023 1248 by Faustino Congress, RN Outcome: Adequate for Discharge 02/23/2023 0734 by Faustino Congress, RN Outcome: Progressing Goal: Will remain free from infection 02/23/2023 1248 by Faustino Congress, RN Outcome: Adequate for Discharge 02/23/2023 0734 by Faustino Congress, RN Outcome: Progressing Goal: Diagnostic test results will improve 02/23/2023 1248 by Faustino Congress, RN Outcome: Adequate for Discharge 02/23/2023 0734 by Faustino Congress, RN Outcome: Progressing Goal: Respiratory complications will improve 02/23/2023 1248 by Faustino Congress, RN Outcome: Adequate for Discharge 02/23/2023 0734 by Faustino Congress, RN Outcome: Progressing Goal: Cardiovascular complication will be avoided 02/23/2023 1248 by Faustino Congress, RN Outcome: Adequate for Discharge 02/23/2023 0734 by Faustino Congress, RN Outcome: Progressing   Problem: Activity: Goal: Risk for activity intolerance will decrease 02/23/2023 1248 by Faustino Congress, RN Outcome: Adequate for Discharge 02/23/2023 0734 by Faustino Congress, RN Outcome: Progressing   Problem: Nutrition: Goal: Adequate nutrition will be maintained 02/23/2023 1248 by Faustino Congress, RN Outcome: Adequate for Discharge 02/23/2023 0734 by Faustino Congress, RN Outcome: Progressing   Problem:  Coping: Goal: Level of anxiety will decrease 02/23/2023 1248 by Faustino Congress, RN Outcome: Adequate for Discharge 02/23/2023 0734 by Faustino Congress, RN Outcome: Progressing   Problem: Elimination: Goal: Will not experience complications related to bowel motility 02/23/2023 1248 by Faustino Congress, RN Outcome: Adequate for Discharge 02/23/2023 0734 by Faustino Congress, RN Outcome: Progressing Goal: Will not experience complications related to urinary retention 02/23/2023 1248 by Faustino Congress, RN Outcome: Adequate for Discharge 02/23/2023 0734 by Faustino Congress, RN Outcome: Progressing   Problem: Pain Managment: Goal: General experience of comfort will improve and/or be controlled 02/23/2023 1248 by Faustino Congress, RN Outcome: Adequate for Discharge 02/23/2023 0734 by Faustino Congress, RN Outcome: Progressing   Problem: Safety: Goal: Ability to remain free from injury will improve 02/23/2023 1248 by Faustino Congress, RN Outcome: Adequate for Discharge 02/23/2023 0734 by Faustino Congress, RN Outcome: Progressing   Problem: Skin Integrity: Goal: Risk for impaired skin integrity will decrease 02/23/2023 1248 by Faustino Congress, RN Outcome: Adequate for Discharge 02/23/2023 0734 by Faustino Congress, RN Outcome: Progressing

## 2023-02-23 NOTE — Plan of Care (Signed)

## 2023-02-23 NOTE — Discharge Summary (Signed)
Physician Discharge Summary   Patient: Kyle Spence MRN: [id number] DOB: 03-Sep-1932  Admit date:     02/18/2023  Discharge date: 02/23/23  Discharge Physician: Enedina Finner   PCP: Marisue Ivan, MD   Recommendations at discharge:    F/u PCP in 1-2 weeks keep your appointment with urology as before Keep log of blood pressure at home  Discharge Diagnoses: Principal Problem:   Generalized weakness Active Problems:   COVID-19 virus infection   UTI (urinary tract infection)   Encephalopathy  Kyle Spence is a 89 y.o. male with medical history significant of advanced dementia, chronic ambulation impairment, BPH, frequent recurrent UTIs, HTN, gout, CKD stage II, peripheral neuropathy, brought in by family member for evaluation of worsening of generalized weakness and confusion.    Family reported that last Friday patient started to have nasal congestion and cough and generalized weakness and appeared to be more confused. Saturday, son took him to urgent care, COVID screening came back positive.    And chronically patient has urinary incontinence and has had 3 UTIs since October last year. Patient did sustain of fall at home few days ago due to weakness.   CT head: There is moderately advanced global atrophy, with atrophic ventriculomegaly and moderate to severe small vessel disease of the cerebral white matter.  There are tiny chronic bilateral lacunar infarcts in the gangliocapsular areas.   Acute metabolic encephalopathy febrile illness Hyponatremia -- suspected due to COVID and UTI -CT head reassuring --Na 129--IVF--130--131 -- PRN Tylenol, symptomatic treatment for viral illness -- urine culture so far negative. Will complete three doses of IV Rocephin -- if mentation does not show improvement consider MRI of the brain --1/25-- patient more awake. Still not verbalizing much. Recognizing some common objects in the room like banana, spoon, reading [facility],  recognizing son. -- No focal weakness however will get MRI of the brain. --1/26-- MRI no stroke. Shows brain atrophy. Reviewed with neurology. No fever today. More awake and feeding himself. --1/27-- overall remains the same. Discharge plan discussed with son.   Recurrent UTI Suspected BPH -Outpatient urology follow-up for urodynamic study-- patient already has appointment per son February 27 --UC E faecalis-- rx with NFT for 5 days --will cont flomax   HTN, uncontrolled - amlodipine -- verapamil was discontinued   Acute on chronic ambulation impairment -PT evaluation--HHPT. Son prefers taking pt home. TOC for d/c planning   CKD stage II - creatinine level stable   Advanced dementia -Patient is DNR/DNI   DVT prophylaxis: Heparin subcut Code Status: DNR    discharge to home.   Family communication :Son at bedside Consults :none CODE STATUS: DNR DVT Prophylaxis :Heparin       Disposition: Home health Diet recommendation:  Discharge Diet Orders (From admission, onward)     Start     Ordered   02/23/23 0000  Diet - low sodium heart healthy        02/23/23 0847           Cardiac diet DISCHARGE MEDICATION: Allergies as of 02/23/2023       Reactions   Enalapril Other (See Comments)   HYPERKALEMIA    Penicillins Swelling        Medication List     STOP taking these medications    atorvastatin 40 MG tablet Commonly known as: LIPITOR   Glycerin-Hypromellose-PEG 400 0.2-0.2-1 % Soln   oxybutynin 5 MG 24 hr tablet Commonly known as: DITROPAN-XL   verapamil 240 MG 24 hr  capsule Commonly known as: VERELAN       TAKE these medications    acetaminophen 325 MG tablet Commonly known as: TYLENOL Take 1-2 tablets (325-650 mg total) by mouth every 6 (six) hours as needed for mild pain (pain score 1-3 or temp > 100.5).   allopurinol 100 MG tablet Commonly known as: ZYLOPRIM Take 100 mg by mouth daily.   amLODipine 10 MG tablet Commonly known as:  NORVASC Take 1 tablet (10 mg total) by mouth daily.   aspirin EC 81 MG tablet Take 81 mg by mouth daily.   docusate sodium 50 MG capsule Commonly known as: COLACE Take by mouth.   furosemide 20 MG tablet Commonly known as: LASIX Take 1 tablet (20 mg total) by mouth daily as needed for edema or fluid. What changed:  when to take this reasons to take this   glucosamine-chondroitin 500-400 MG tablet Take 1 tablet by mouth 3 (three) times daily.   guaiFENesin-dextromethorphan 100-10 MG/5ML syrup Commonly known as: ROBITUSSIN DM Take 5 mLs by mouth every 4 (four) hours as needed for cough.   Multi-Vitamins Tabs Take 1 tablet by mouth daily.   nitrofurantoin (macrocrystal-monohydrate) 100 MG capsule Commonly known as: MACROBID Take 1 capsule (100 mg total) by mouth every 12 (twelve) hours for 3 days.   Prostate 2.4 Caps Take by mouth.   tamsulosin 0.4 MG Caps capsule Commonly known as: FLOMAX Take 0.4 mg by mouth daily.               Durable Medical Equipment  (From admission, onward)           Start     Ordered   02/22/23 1426  For home use only DME Other see comment  Once       Comments: Michiel Sites lift  Question:  Length of Need  Answer:  6 Months   02/22/23 1425              Discharge Care Instructions  (From admission, onward)           Start     Ordered   02/23/23 0000  Discharge wound care:       Comments: FOAM PADS ON ELBOW   02/23/23 0847            Follow-up Information     Marisue Ivan, MD. Schedule an appointment as soon as possible for a visit in 1 week(s).   Specialty: Family Medicine Contact information: [address] [facility] [place] [address] [phone]                Discharge Exam: Ceasar Mons Weights   02/18/23 1450  Weight: 91.9 kg   GENERAL:  89 y.o.-year-old patient with no acute distress.  LUNGS: Normal breath sounds bilaterally, no wheezing CARDIOVASCULAR: S1, S2 normal.  No murmur   ABDOMEN: Soft, nontender, nondistended.  EXTREMITIES: No  edema b/l. Puffy upper extremities NEUROLOGIC: unable to assess but grossly appears intact more awake however not oriented to time place. Recognizing some common objects.  Condition at discharge: fair  The results of significant diagnostics from this hospitalization (including imaging, microbiology, ancillary and laboratory) are listed below for reference.   Imaging Studies: MR BRAIN WO CONTRAST Result Date: 02/22/2023 CLINICAL DATA:  Initial evaluation for mental status change, unknown cause. EXAM: MRI HEAD WITHOUT CONTRAST TECHNIQUE: Multiplanar, multiecho pulse sequences of the brain and surrounding structures were obtained without intravenous contrast. COMPARISON:  Prior CT from 02/18/2023 and MRI from 10/19/2022  FINDINGS: Brain: Diffuse prominence of the CSF containing spaces, compatible with atrophy, most pronounced about the frontal and temporal lobes bilaterally. Patchy and confluent T2/FLAIR hyperintensity involving the periventricular white matter, most characteristic of chronic microvascular ischemic disease, mild for age. No evidence for acute or subacute infarct. Gray-white matter differentiation maintained. No acute or chronic intracranial blood products. No mass lesion, mass effect or midline shift. Prominent lateral and third ventriculomegaly, with comparatively normal caliber of the fourth ventricle. Crowding of the cortical sulci at the vertex. Findings can be seen in the setting of NPH. No extra-axial fluid collection. Pituitary gland suprasellar region within normal limits. Vascular: Major intracranial vascular flow voids are maintained. Skull and upper cervical spine: Cranial junction with normal limits. Bone marrow signal intensity normal. Small lipoma noted at the right frontal scalp. Sinuses/Orbits: Prior bilateral ocular lens replacement. Paranasal sinuses are largely clear. No significant mastoid effusion.  Other: None. IMPRESSION: 1. No acute intracranial abnormality. 2. Cerebral atrophy, most pronounced about the frontal and temporal regions bilaterally. Underlying mild chronic microvascular ischemic disease. 3. Prominent lateral and third ventriculomegaly, with crowding of the cortical sulci at the vertex. Findings can be seen in the setting of NPH. Electronically Signed   By: Rise Mu M.D.   On: 02/22/2023 01:32   CT HEAD WO CONTRAST ( ) Result Date: 02/18/2023 CLINICAL DATA:  Hypertensive, altered mental status. Evaluate for intracranial hemorrhage. Diagnosed with COVID four days ago with failure to thrive since then, loss of ability to speak. EXAM: CT HEAD WITHOUT CONTRAST TECHNIQUE: Contiguous axial images were obtained from the base of the skull through the vertex without intravenous contrast. RADIATION DOSE REDUCTION: This exam was performed according to the departmental dose-optimization program which includes automated exposure control, adjustment of the mA and/or kV according to patient size and/or use of iterative reconstruction technique. COMPARISON:  Head CT 10/18/2022, brain MRI 10/19/2022. FINDINGS: Brain: There is moderately advanced global atrophy, with atrophic ventriculomegaly and moderate to severe small vessel disease of the cerebral white matter. There are tiny chronic bilateral lacunar infarcts in the gangliocapsular areas. No cortical based acute infarct, hemorrhage, mass or mass effect is seen. No midline shift. Basal cisterns are clear. Vascular: Moderate to heavy calcifications noted both distal vertebral arteries, both siphons. No hyperdense central vessel is seen. Skull: There is a small scalp lipoma right frontal area. No skull fractures or focal skull lesions are seen. Sinuses/Orbits: There is scattered fluid in the lower medial right mastoid air cells, seen previously. The mastoid air cells are otherwise clear. Paranasal sinuses are clear. Midline nasal septum. Old  lens extractions with otherwise negative orbits. Other: None. IMPRESSION: 1. No acute intracranial CT findings or interval changes. 2. Atrophy and small-vessel disease. 3. Right mastoid effusion, unchanged. Electronically Signed   By: Almira Bar M.D.   On: 02/18/2023 22:57   DG Chest Port 1 View Result Date: 02/18/2023 CLINICAL DATA:  Sepsis EXAM: PORTABLE CHEST 1 VIEW COMPARISON:  10/18/2022 FINDINGS: Single frontal view of the chest demonstrates a stable cardiac silhouette. No airspace disease, effusion, or pneumothorax. There is an acute to subacute left lateral fifth rib fracture. No other acute bony abnormalities. IMPRESSION: 1. Acute to subacute left lateral fifth rib fracture. This is not visible on the comparison exam. 2. No acute intrathoracic process. Electronically Signed   By: Sharlet Salina M.D.   On: 02/18/2023 15:36    Microbiology: Results for orders placed or performed during the hospital encounter of 02/18/23  Blood Culture (routine x 2)  Status: None   Collection Time: 02/18/23  4:45 PM   Specimen: BLOOD  Result Value Ref Range Status   Specimen Description BLOOD BLOOD RIGHT ARM RAC  Final   Special Requests   Final    BOTTLES DRAWN AEROBIC AND ANAEROBIC Blood Culture results may not be optimal due to an inadequate volume of blood received in culture bottles   Culture   Final    NO GROWTH 5 DAYS Performed at [facility], [address] [place], [place] [address]    Report Status 02/23/2023 FINAL  Final  Blood Culture (routine x 2)     Status: None   Collection Time: 02/18/23  4:55 PM   Specimen: BLOOD  Result Value Ref Range Status   Specimen Description BLOOD BLOOD RIGHT HAND  Final   Special Requests   Final    BOTTLES DRAWN AEROBIC ONLY Blood Culture adequate volume   Culture   Final    NO GROWTH 5 DAYS Performed at [facility], [address] [place], [place] [address]    Report Status 02/23/2023 FINAL  Final  Urine Culture      Status: Abnormal   Collection Time: 02/19/23  1:16 AM   Specimen: Urine, Random  Result Value Ref Range Status   Specimen Description   Final    URINE, RANDOM Performed at [facility], [address] [place], [place] [address]    Special Requests   Final    NONE Reflexed from [id number] Performed at [facility], [address] [place], [place] [address]    Culture 60,000 COLONIES/mL ENTEROCOCCUS FAECALIS (A)  Final   Report Status 02/21/2023 FINAL  Final   Organism ID, Bacteria ENTEROCOCCUS FAECALIS (A)  Final      Susceptibility   Enterococcus faecalis - MIC*    AMPICILLIN <=2 SENSITIVE Sensitive     NITROFURANTOIN <=16 SENSITIVE Sensitive     VANCOMYCIN 2 SENSITIVE Sensitive     * 60,000 COLONIES/mL ENTEROCOCCUS FAECALIS    Labs: CBC: Recent Labs  Lab 02/18/23 1645 02/20/23 0436  WBC 11.1* 12.4*  NEUTROABS 8.3*  --   HGB 13.7 12.9*  HCT 40.1 38.4*  MCV 93.0 94.8  PLT 347 336   Basic Metabolic Panel: Recent Labs  Lab 02/18/23 1645 02/20/23 0436 02/21/23 1054 02/22/23 1004  NA 130* 129* 130* 131*  K 3.8 4.8 3.7 3.8  CL 95* 94* 97* 99  CO2 25 20* 20* 22  GLUCOSE 89 94 157* 95  BUN 28* 25* 32* 25*  CREATININE 0.90 1.24 1.12 0.94  CALCIUM 8.6* 8.7* 8.2* 8.3*   Liver Function Tests: Recent Labs  Lab 02/18/23 1645  AST 42*  ALT 50*  ALKPHOS 102  BILITOT 0.8  PROT 6.5  ALBUMIN 3.2*   CBG: Recent Labs  Lab 02/23/23 0807  GLUCAP 183*    Discharge time spent: greater than 30 minutes.  Signed: Enedina Finner, MD [facility] 02/23/2023

## 2023-02-23 NOTE — TOC Progression Note (Signed)
Transition of Care [facility]) - Progression Note    Patient Details  Name: Kyle Spence MRN: [id number] Date of Birth: 01-Sep-1932  Transition of [facility]) CM/SW Contact  Marlowe Sax, RN Phone Number: 02/23/2023, 9:06 AM  Clinical Narrative:    Spoke with Son [name], Confirmed address, the patient will need EMS to transport home, called ems to arrange He will need Transport gown due to not having clothing her, Nurse and Tech made aware   Expected Discharge Plan: Home w Home Health Services Barriers to Discharge: Continued Medical Work up  Expected Discharge Plan and Services In-house Referral: Clinical Social Work   Post Acute Care Choice: Home Health Living arrangements for the past 2 months: Single Family Home Expected Discharge Date: 02/23/23               DME Arranged:  (hoyar lift) DME Agency: [employer] Date DME Agency Contacted: 02/22/23 Time DME Agency Contacted: 1423   HH Arranged: PT, OT HH Agency:  [employer]) Date HH Agency Contacted: 02/22/23 Time HH Agency Contacted: 1425 Representative spoke with at [facility] Agency: Sarah   Social Determinants of Health (SDOH) Interventions SDOH Screenings   Food Insecurity: No Food Insecurity (02/19/2023)  Housing: Low Risk  (02/19/2023)  Transportation Needs: No Transportation Needs (02/19/2023)  Utilities: Not At Risk (02/19/2023)  Financial Resource Strain: Low Risk  (12/03/2022)   Received from [facility]  Social Connections: Moderately Isolated (02/19/2023)  Tobacco Use: Low Risk  (02/19/2023)    Readmission Risk Interventions    10/21/2022   10:31 AM  Readmission Risk Prevention Plan  Transportation Screening Complete  PCP or Specialist Appt within 5-7 Days Complete  Home Care Screening Complete

## 2023-02-23 NOTE — Plan of Care (Signed)

## 2023-02-23 NOTE — Discharge Instructions (Signed)
Keep your scheduled appt with Dr Lonna Cobb for Feb 27th

## 2023-02-26 ENCOUNTER — Ambulatory Visit: Payer: Medicare Other | Admitting: Urology

## 2023-03-26 ENCOUNTER — Ambulatory Visit: Payer: Self-pay | Admitting: Urology

## 2023-04-24 ENCOUNTER — Telehealth: Payer: Self-pay

## 2023-04-24 ENCOUNTER — Telehealth: Payer: Self-pay | Admitting: Family Medicine

## 2023-04-24 NOTE — Telephone Encounter (Signed)
 The referral was closed for this reason: Spoke with pt son and he stated that pt has hospice inside the home and they are taking care of that, stated he dont need our services.

## 2023-08-18 NOTE — Telephone Encounter (Signed)
 Error

## 2023-11-19 ENCOUNTER — Emergency Department

## 2023-11-19 ENCOUNTER — Emergency Department
Admission: EM | Admit: 2023-11-19 | Discharge: 2023-11-20 | Disposition: A | Discharge status: Home / Self Care | Attending: Emergency Medicine | Admitting: Emergency Medicine

## 2023-11-19 ENCOUNTER — Other Ambulatory Visit: Payer: Self-pay

## 2023-11-19 DIAGNOSIS — I129 Hypertensive chronic kidney disease with stage 1 through stage 4 chronic kidney disease, or unspecified chronic kidney disease: Secondary | ICD-10-CM | POA: Insufficient documentation

## 2023-11-19 DIAGNOSIS — F0390 Unspecified dementia without behavioral disturbance: Secondary | ICD-10-CM | POA: Insufficient documentation

## 2023-11-19 DIAGNOSIS — N189 Chronic kidney disease, unspecified: Secondary | ICD-10-CM | POA: Insufficient documentation

## 2023-11-19 DIAGNOSIS — D72829 Elevated white blood cell count, unspecified: Secondary | ICD-10-CM | POA: Insufficient documentation

## 2023-11-19 DIAGNOSIS — R140 Abdominal distension (gaseous): Secondary | ICD-10-CM

## 2023-11-19 DIAGNOSIS — K5900 Constipation, unspecified: Secondary | ICD-10-CM | POA: Insufficient documentation

## 2023-11-19 DIAGNOSIS — R748 Abnormal levels of other serum enzymes: Secondary | ICD-10-CM | POA: Insufficient documentation

## 2023-11-19 LAB — LIPASE, BLOOD: Lipase: 54 U/L — ABNORMAL HIGH (ref 11–51)

## 2023-11-19 LAB — COMPREHENSIVE METABOLIC PANEL WITH GFR
ALT: 20 U/L (ref 0–44)
AST: 22 U/L (ref 15–41)
Albumin: 3.2 g/dL — ABNORMAL LOW (ref 3.5–5.0)
Alkaline Phosphatase: 98 U/L (ref 38–126)
Anion gap: 11 (ref 5–15)
BUN: 26 mg/dL — ABNORMAL HIGH (ref 8–23)
CO2: 24 mmol/L (ref 22–32)
Calcium: 8.9 mg/dL (ref 8.9–10.3)
Chloride: 109 mmol/L (ref 98–111)
Creatinine, Ser: 0.97 mg/dL (ref 0.61–1.24)
GFR, Estimated: >60 mL/min (ref >60)
Glucose, Bld: 117 mg/dL — ABNORMAL HIGH (ref 70–99)
Potassium: 3.7 mmol/L (ref 3.5–5.1)
Sodium: 144 mmol/L (ref 135–145)
Total Bilirubin: 0.5 mg/dL (ref 0.0–1.2)
Total Protein: 6.9 g/dL (ref 6.5–8.1)

## 2023-11-19 LAB — CBC
HCT: 43.6 % (ref 39.0–52.0)
Hemoglobin: 14 g/dL (ref 13.0–17.0)
MCH: 30.8 pg (ref 26.0–34.0)
MCHC: 32.1 g/dL (ref 30.0–36.0)
MCV: 96 fL (ref 80.0–100.0)
Platelets: 355 K/uL (ref 150–400)
RBC: 4.54 MIL/uL (ref 4.22–5.81)
RDW: 14.9 % (ref 11.5–15.5)
WBC: 14.4 K/uL — ABNORMAL HIGH (ref 4.0–10.5)
nRBC: 0 % (ref 0.0–0.2)

## 2023-11-19 MED ORDER — LACTULOSE 10 GM/15ML PO SOLN
10.0000 g | Freq: Once | ORAL | Status: AC
  Administered 2023-11-20: 10 g via ORAL
  Filled 2023-11-19: qty 30

## 2023-11-19 MED ORDER — LACTULOSE 10 GM/15ML PO SOLN: 10.0000 g | Freq: Two times a day (BID) | ORAL | 0 refills | Status: AC | PRN

## 2023-11-19 MED ORDER — IOHEXOL 300 MG/ML  SOLN
100.0000 mL | Freq: Once | INTRAMUSCULAR | Status: AC | PRN
  Administered 2023-11-19: 100 mL via INTRAVENOUS

## 2023-11-19 NOTE — Progress Notes (Signed)
 [facility] Liaison Note  This patient is currently followed by [facility].  [facility] will follow through discharge disposition.    Please call with any hospice related questions or concerns.  [facility] Liaison [phone]

## 2023-11-19 NOTE — ED Notes (Signed)
 Pt transported to CT ?

## 2023-11-19 NOTE — ED Triage Notes (Addendum)
 Pt is coming from home with abd pain and distention x1 week. EMS sts that pt hospice nurse called 911 for pt to come to the ED. Pt is on hospice for dementia. Pt is bed bound at baseline.

## 2023-11-19 NOTE — Discharge Instructions (Signed)
 Kyle Spence was seen in the emergency department for his abdominal distention.  His CT showed that he had severe constipation, but fortunately no surgical emergencies.  I sent a prescription for a oral medication called lactulose to help with this.  Continue to follow-up as directed by your hospice nurse.  Return to the ER for new or worsening symptoms as appropriate for your goals of care.  To help prevent constipation in the future please: Eat a high-fiber diet Drink water and other fluids during the day Go to the bathroom at regular times every day  To treat your constipation:  Take a capful of MiraLAX 1-2 times daily You can also try over-the-counter stool softener such as Dulcolax or milk of magnesia You can use over-the-counter medications such as senna, but only use this for a few days. Do not use this for a prolonged period.  If you are still having constipation, you can try an over-the-counter suppository or enema

## 2023-11-19 NOTE — ED Provider Notes (Signed)
 89 year old male  [facility] Provider Note    Event Date/Time   First MD Initiated Contact with Patient 11/19/23 2001     (approximate)   History   Abdominal Pain   HPI  Kyle Spence is a 89 year old male with history of hypertension, CKD, dementia presenting to the emergency department for evaluation of abdominal distention.  Patient had been having issues with diarrhea, was placed on stool bulking agents.  Last week, son noticed that patient had worsening abdominal distention limited stool output.  Has been trying some over-the-counter laxatives without significant improvement.  Hospice nurse evaluated the patient today, recommended ER presentation to evaluate for alternative pathologies.  Son reports that the patient is a DNR, would need to consider if he would want potential procedural intervention.    Physical Exam   Triage Vital Signs: ED Triage Vitals  Encounter Vitals Group     BP 11/19/23 1428 134/79     Girls Systolic BP Percentile --      Girls Diastolic BP Percentile --      Boys Systolic BP Percentile --      Boys Diastolic BP Percentile --      Pulse Rate 11/19/23 1428 94     Resp 11/19/23 1428 17     Temp 11/19/23 1428 98 F (36.7 C)     Temp Source 11/19/23 1428 Oral     SpO2 11/19/23 1428 96 %     Weight 11/19/23 1421 220 lb (99.8 kg)     Height 11/19/23 1421 6' (1.829 m)     Head Circumference --      Peak Flow --      Pain Score 11/19/23 1421 0     Pain Loc --      Pain Education --      Exclude from Growth Chart --     Most recent vital signs: Vitals:   11/19/23 1428 11/19/23 2010  BP: 134/79 (!) 152/77  Pulse: 94 87  Resp: 17 (!) 22  Temp: 98 F (36.7 C) 98.2 F (36.8 C)  SpO2: 96% 100%     General: Awake, interactive  CV:  Good peripheral perfusion Resp:  Mild tachypnea, but lungs clear to auscultation Abd:  Distended abdomen, but no significant appreciable tenderness to palpation Neuro:  Symmetric facial  movement, fluid speech   ED Results / Procedures / Treatments   Labs (all labs ordered are listed, but only abnormal results are displayed) Labs Reviewed  LIPASE, BLOOD - Abnormal; Notable for the following components:      Result Value   Lipase 54 (*)    All other components within normal limits  COMPREHENSIVE METABOLIC PANEL WITH GFR - Abnormal; Notable for the following components:   Glucose, Bld 117 (*)    BUN 26 (*)    Albumin  3.2 (*)    All other components within normal limits  CBC - Abnormal; Notable for the following components:   WBC 14.4 (*)    All other components within normal limits     EKG EKG independently reviewed and interpreted by myself demonstrates:    RADIOLOGY Imaging independently reviewed and interpreted by myself demonstrates:  CT abdomen pelvis demonstrates extensive constipation with associated compression of the bladder and ureter  Formal Radiology Read:  CT ABDOMEN PELVIS W CONTRAST Result Date: 11/19/2023 EXAM: CT ABDOMEN AND PELVIS WITH CONTRAST 11/19/2023 09:09:20 PM TECHNIQUE: CT of the abdomen and pelvis was performed with the administration of 100 mL of iohexol (  OMNIPAQUE) 300 MG/ML solution. Multiplanar reformatted images are provided for review. Automated exposure control, iterative reconstruction, and/or weight-based adjustment of the mA/kV was utilized to reduce the radiation dose to as low as reasonably achievable. COMPARISON: Comparison with 06/16/2024. CLINICAL HISTORY: Bowel obstruction suspected; abd distention. Table formatting from the original note was not included.; Images from the original note were not included.; Per ed notes; Pt is coming from home with abd pain and distention x1 week. EMS sts that pt hospice nurse called 911 for pt to come to the ED. Pt is on hospice for dementia. Pt is bed bound at baseline. FINDINGS: LOWER CHEST: No acute abnormality. LIVER: The liver is unremarkable. GALLBLADDER AND BILE DUCTS: Gallbladder is  unremarkable. No biliary ductal dilatation. SPLEEN: No acute abnormality. PANCREAS: No acute abnormality. ADRENAL GLANDS: No acute abnormality. KIDNEYS, URETERS AND BLADDER: Moderate left hydroureteronephrosis. The distal ureter and UVJ on the left are obscured by streak artifact. There is urothelial thickening and mild adjacent stranding about the left ureter and left renal pelvis. This is favored due to ureteral compression from the large rectal stool ball. No stones in the kidneys or ureters. Urinary bladder is unremarkable. GI AND BOWEL: Stomach demonstrates no acute abnormality. Extensive colonic stool burden with extensive stool in the rectum. Normal appendix. There is no bowel obstruction. PERITONEUM AND RETROPERITONEUM: No ascites. No free air. VASCULATURE: Aorta is normal in caliber. Advanced aortic atherosclerotic calcification. LYMPH NODES: No lymphadenopathy. REPRODUCTIVE ORGANS: No acute abnormality. BONES AND SOFT TISSUES: Left hip arthroplasty. No acute osseous abnormality. No focal soft tissue abnormality. 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 IMPRESSION: 1. Severe constipation with extensive stool in the sigmoid colon and rectum; correlate for fecal impaction. 2. Moderate left hydroureteronephrosis, likely secondary to ureteral compression from the large rectal stool burden. Electronically signed by: Norman Gatlin MD 11/19/2023 09:20 PM EDT RP Workstation: [id number]    PROCEDURES:  Critical Care performed: No  Procedures   MEDICATIONS ORDERED IN ED: Medications  lactulose (CHRONULAC) 10 GM/15ML solution 10 g (has no administration in time range)  iohexol (OMNIPAQUE) 300 MG/ML solution 100 mL (100 mLs Intravenous Contrast Given 11/19/23 2058)     IMPRESSION / MDM / ASSESSMENT AND PLAN / ED COURSE  I reviewed the triage vital signs and the nursing notes.  Differential diagnosis includes, but is not limited to, constipation, small bowel obstruction, volvulus, other  acute intra-abdominal process  Patient's presentation is most consistent with acute presentation with potential threat to life or bodily function.  89 year old male presenting to the ER for evaluation of abdominal distention.  Stable vitals on presentation.  Labs with leukocytosis WC of 14.4, CMP without critical derangements.  Minimally elevated lipase.  CT demonstrated extensive findings of constipation that I suspect is contributing to patient's abdominal distention.  No obstruction noted.  Discussed results of workup with patient and son.  Discussed potential options including additional oral agents, enema, suppository.  Patient has already had a fecal disimpaction from hospice nurse today, discussed limited utility in repeating this.  After discussion of options, family would like to receive a dose of lactulose prior to discharge and be discharged home with a prescription for this.  Especially given patient's hospice status, do think this is an appropriate plan.  Strict return precautions provided.  Patient discharged stable condition.     FINAL CLINICAL IMPRESSION(S) / ED DIAGNOSES   Final diagnoses:  Abdominal distention  Constipation, unspecified constipation type     Rx / DC Orders  ED Discharge Orders          Ordered    lactulose (CHRONULAC) 10 GM/15ML solution  2 times daily PRN        11/19/23 2307             Note:  This document was prepared using Dragon voice recognition software and may include unintentional dictation errors.   Levander Slate, MD 11/19/23 [phone]

## 2023-11-20 NOTE — ED Notes (Signed)
 [facility]  called for  transport home
